# Patient Record
Sex: Male | Born: 1985 | Race: Black or African American | Hispanic: No | Marital: Single | Smoking: Current some day smoker
Health system: Southern US, Community
[De-identification: ages and names within clinical notes are randomized; demographics above are authoritative.]

## PROBLEM LIST (undated history)

## (undated) DIAGNOSIS — I10 Essential (primary) hypertension: Secondary | ICD-10-CM

## (undated) DIAGNOSIS — S62309A Unspecified fracture of unspecified metacarpal bone, initial encounter for closed fracture: Secondary | ICD-10-CM

## (undated) DIAGNOSIS — K219 Gastro-esophageal reflux disease without esophagitis: Secondary | ICD-10-CM

## (undated) HISTORY — DX: Essential (primary) hypertension: I10

## (undated) HISTORY — DX: Unspecified fracture of unspecified metacarpal bone, initial encounter for closed fracture: S62.309A

---
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
## 2007-11-19 ENCOUNTER — Encounter: Payer: Self-pay | Admitting: Family Medicine

## 2007-11-19 ENCOUNTER — Ambulatory Visit: Payer: Self-pay | Admitting: Family Medicine

## 2007-11-19 DIAGNOSIS — L708 Other acne: Secondary | ICD-10-CM | POA: Insufficient documentation

## 2007-11-19 DIAGNOSIS — F17200 Nicotine dependence, unspecified, uncomplicated: Secondary | ICD-10-CM | POA: Insufficient documentation

## 2007-12-19 ENCOUNTER — Encounter (INDEPENDENT_AMBULATORY_CARE_PROVIDER_SITE_OTHER): Payer: Self-pay | Admitting: *Deleted

## 2008-01-03 ENCOUNTER — Encounter: Payer: Self-pay | Admitting: Family Medicine

## 2008-01-03 ENCOUNTER — Ambulatory Visit: Payer: Self-pay | Admitting: Family Medicine

## 2008-01-03 LAB — CONVERTED CEMR LAB
Chlamydia, Swab/Urine, PCR: NEGATIVE
GC Probe Amp, Urine: NEGATIVE

## 2008-01-08 ENCOUNTER — Encounter: Payer: Self-pay | Admitting: Family Medicine

## 2009-06-09 ENCOUNTER — Ambulatory Visit: Payer: Self-pay | Admitting: Family Medicine

## 2009-06-09 ENCOUNTER — Encounter (INDEPENDENT_AMBULATORY_CARE_PROVIDER_SITE_OTHER): Payer: Self-pay | Admitting: *Deleted

## 2009-06-09 ENCOUNTER — Encounter: Payer: Self-pay | Admitting: Family Medicine

## 2009-06-09 LAB — CONVERTED CEMR LAB

## 2009-06-10 ENCOUNTER — Encounter: Payer: Self-pay | Admitting: Family Medicine

## 2010-08-04 DIAGNOSIS — S62309A Unspecified fracture of unspecified metacarpal bone, initial encounter for closed fracture: Secondary | ICD-10-CM

## 2010-08-04 HISTORY — DX: Unspecified fracture of unspecified metacarpal bone, initial encounter for closed fracture: S62.309A

## 2010-08-26 ENCOUNTER — Emergency Department (HOSPITAL_COMMUNITY)
Admission: EM | Admit: 2010-08-26 | Discharge: 2010-08-26 | Disposition: A | Discharge status: Home / Self Care | Payer: Self-pay | Attending: Emergency Medicine | Admitting: Emergency Medicine

## 2010-08-26 ENCOUNTER — Emergency Department (HOSPITAL_COMMUNITY): Payer: Self-pay

## 2010-08-26 DIAGNOSIS — M25539 Pain in unspecified wrist: Secondary | ICD-10-CM | POA: Insufficient documentation

## 2010-08-26 DIAGNOSIS — S62309A Unspecified fracture of unspecified metacarpal bone, initial encounter for closed fracture: Secondary | ICD-10-CM | POA: Insufficient documentation

## 2010-08-26 DIAGNOSIS — S20229A Contusion of unspecified back wall of thorax, initial encounter: Secondary | ICD-10-CM | POA: Insufficient documentation

## 2010-08-26 DIAGNOSIS — M7989 Other specified soft tissue disorders: Secondary | ICD-10-CM | POA: Insufficient documentation

## 2010-08-26 DIAGNOSIS — M545 Low back pain, unspecified: Secondary | ICD-10-CM | POA: Insufficient documentation

## 2010-08-26 DIAGNOSIS — M79609 Pain in unspecified limb: Secondary | ICD-10-CM | POA: Insufficient documentation

## 2010-08-26 DIAGNOSIS — Y92009 Unspecified place in unspecified non-institutional (private) residence as the place of occurrence of the external cause: Secondary | ICD-10-CM | POA: Insufficient documentation

## 2010-08-26 DIAGNOSIS — W108XXA Fall (on) (from) other stairs and steps, initial encounter: Secondary | ICD-10-CM | POA: Insufficient documentation

## 2010-09-06 ENCOUNTER — Encounter: Payer: Self-pay | Admitting: Family Medicine

## 2011-02-14 ENCOUNTER — Inpatient Hospital Stay (INDEPENDENT_AMBULATORY_CARE_PROVIDER_SITE_OTHER)
Admission: RE | Admit: 2011-02-14 | Discharge: 2011-02-14 | Disposition: A | Discharge status: Home / Self Care | Payer: Self-pay | Source: Ambulatory Visit | Attending: Family Medicine | Admitting: Family Medicine

## 2011-02-14 DIAGNOSIS — IMO0002 Reserved for concepts with insufficient information to code with codable children: Secondary | ICD-10-CM

## 2011-02-21 ENCOUNTER — Emergency Department (INDEPENDENT_AMBULATORY_CARE_PROVIDER_SITE_OTHER): Payer: Medicaid Other

## 2011-02-21 ENCOUNTER — Emergency Department (HOSPITAL_BASED_OUTPATIENT_CLINIC_OR_DEPARTMENT_OTHER)
Admission: EM | Admit: 2011-02-21 | Discharge: 2011-02-21 | Disposition: A | Discharge status: Home / Self Care | Payer: Medicaid Other | Attending: Emergency Medicine | Admitting: Emergency Medicine

## 2011-02-21 ENCOUNTER — Encounter (HOSPITAL_BASED_OUTPATIENT_CLINIC_OR_DEPARTMENT_OTHER): Payer: Self-pay | Admitting: *Deleted

## 2011-02-21 DIAGNOSIS — K219 Gastro-esophageal reflux disease without esophagitis: Secondary | ICD-10-CM

## 2011-02-21 DIAGNOSIS — R1013 Epigastric pain: Secondary | ICD-10-CM

## 2011-02-21 DIAGNOSIS — R109 Unspecified abdominal pain: Secondary | ICD-10-CM

## 2011-02-21 LAB — COMPREHENSIVE METABOLIC PANEL
ALT: 20 U/L (ref 0–53)
AST: 20 U/L (ref 0–37)
Alkaline Phosphatase: 70 U/L (ref 39–117)
CO2: 27 mEq/L (ref 19–32)
Calcium: 9.9 mg/dL (ref 8.4–10.5)
GFR calc Af Amer: >60 mL/min (ref >60)
GFR calc non Af Amer: >60 mL/min (ref >60)
Glucose, Bld: 96 mg/dL (ref 70–99)
Potassium: 3.6 mEq/L (ref 3.5–5.1)
Sodium: 137 mEq/L (ref 135–145)

## 2011-02-21 LAB — DIFFERENTIAL
Basophils Absolute: 0.1 10*3/uL (ref 0.0–0.1)
Eosinophils Relative: 8 % — ABNORMAL HIGH (ref 0–5)
Lymphocytes Relative: 47 % — ABNORMAL HIGH (ref 12–46)
Lymphs Abs: 3 10*3/uL (ref 0.7–4.0)
Neutro Abs: 2.2 10*3/uL (ref 1.7–7.7)
Neutrophils Relative %: 35 % — ABNORMAL LOW (ref 43–77)

## 2011-02-21 LAB — CBC
MCV: 86.5 fL (ref 78.0–100.0)
Platelets: 366 10*3/uL (ref 150–400)
RBC: 4.88 MIL/uL (ref 4.22–5.81)
RDW: 11.8 % (ref 11.5–15.5)
WBC: 6.3 10*3/uL (ref 4.0–10.5)

## 2011-02-21 LAB — URINALYSIS, ROUTINE W REFLEX MICROSCOPIC
Bilirubin Urine: NEGATIVE
Glucose, UA: NEGATIVE mg/dL
Hgb urine dipstick: NEGATIVE
Protein, ur: NEGATIVE mg/dL
Specific Gravity, Urine: 1.029 (ref 1.005–1.030)

## 2011-02-21 MED ORDER — GI COCKTAIL ~~LOC~~
30.0000 mL | Freq: Once | ORAL | Status: AC
  Administered 2011-02-21: 30 mL via ORAL
  Filled 2011-02-21: qty 30

## 2011-02-21 MED ORDER — HYDROCODONE-ACETAMINOPHEN 5-500 MG PO TABS: 1.0000 | ORAL_TABLET | Freq: Four times a day (QID) | ORAL | Status: AC | PRN

## 2011-02-21 NOTE — ED Notes (Signed)
Pt c/o generalized abd pain for past few weeks, denies any fever, no N/V. Pt states that he has only had one BM in past few days and that it was not a lot of stool.

## 2011-02-21 NOTE — ED Notes (Signed)
PT generalized abd pain x 3 weeks. Denies n/v/d

## 2011-02-21 NOTE — ED Provider Notes (Signed)
History    Scribed for No att. providers found, the patient was seen in room MH08/MH08. This chart was scribed by Katha Cabal. This patient's care was started at 9:04PM.   CSN: [id number] Arrival date & time: 02/21/2011  8:24 PM  Chief Complaint  Patient presents with  . Abdominal Pain   HPI IMRI LOR is a 25 y.o. male who presents to the Emergency Department complaining of intermittent moderate periumbilical abdominal pain onset 3 weeks ago with associated sleep disturbance due to pain, and decreased bowel movements.  Denies n/v/d,acid reflux, heartburn, change in diet, previous surgeries, dysuria, and daily ETOH use.  Pain is aggravated at bedtime and by drinking ETOH.  HPI ELEMENTS:  Location: periumbilical abdominal   Onset: 3 weeks ago Duration: persistent since onset  Timing:  intermittent   Severity: moderate  Modifying factors: Aggravating worse at bedtime, ETOH,  Context: as above  Associated symptoms: sleep disturbance due to pain, and decreased bowel movements. Denies n/v/d,acid reflux, heartburn, change in diet, previous surgeries, dysuria,   PAST MEDICAL HISTORY:  Past Medical History  Diagnosis Date  . Fracture of metacarpal of right hand, closed 08/2010    Comminuted fx @ [facility]  [facility]    PAST SURGICAL HISTORY:  History reviewed. No pertinent past surgical history.  MEDICATIONS:  Previous Medications   No medications on file     ALLERGIES:  Allergies as of 02/21/2011  . (No Known Allergies)     FAMILY HISTORY:  History reviewed. No pertinent family history.   SOCIAL HISTORY: History   Social History  . Marital Status: Single    Spouse Name: N/A    Number of Children: N/A  . Years of Education: N/A   Social History Main Topics  . Smoking status: Current Everyday Smoker -- 1.0 packs/day  . Smokeless tobacco: None  . Alcohol Use: No  . Drug Use: Yes  . Sexually Active: No   Other Topics Concern  . None   Social History  Narrative  . None     Review of Systems 10 Systems reviewed and are negative for acute change except as noted in the HPI.  Physical Exam  BP 117/69  Pulse 60  Temp(Src) 98.9 F (37.2 C) (Oral)  Resp 16  Wt 149 lb (67.586 kg)  SpO2 100%  Physical Exam  Nursing note and vitals reviewed. Constitutional: He is oriented to person, place, and time. He appears well-developed and well-nourished.  HENT:  Head: Normocephalic and atraumatic.  Mouth/Throat: Oropharynx is clear and moist.  Neck: Normal range of motion. Neck supple.  Cardiovascular: Normal rate, regular rhythm and normal heart sounds.  Exam reveals no gallop.   No murmur heard. Pulmonary/Chest: Effort normal and breath sounds normal. No respiratory distress. He has no wheezes. He has no rales.  Abdominal: Soft. Bowel sounds are normal. He exhibits no distension. There is tenderness (epigastric tenderness). There is no CVA tenderness.  Musculoskeletal: Normal range of motion.  Lymphadenopathy:    He has no cervical adenopathy.  Neurological: He is alert and oriented to person, place, and time.  Skin: Skin is warm and dry.  Psychiatric: He has a normal mood and affect. His behavior is normal.    ED Course  Procedures  OTHER DATA REVIEWED: Nursing notes, vital signs, and past medical records reviewed.  LABS / RADIOLOGY:  Results for orders placed during the hospital encounter of 02/21/11  CBC      Component Value Range   WBC  6.3  4.0 - 10.5 (K/uL)   RBC 4.88  4.22 - 5.81 (MIL/uL)   Hemoglobin 14.8  13.0 - 17.0 (g/dL)   HCT 78.2  95.6 - 21.3 (%)   MCV 86.5  78.0 - 100.0 (fL)   MCH 30.3  26.0 - 34.0 (pg)   MCHC 35.1  30.0 - 36.0 (g/dL)   RDW 08.6  57.8 - 46.9 (%)   Platelets 366  150 - 400 (K/uL)  DIFFERENTIAL      Component Value Range   Neutrophils Relative 35 (*) 43 - 77 (%)   Neutro Abs 2.2  1.7 - 7.7 (K/uL)   Lymphocytes Relative 47 (*) 12 - 46 (%)   Lymphs Abs 3.0  0.7 - 4.0 (K/uL)   Monocytes Relative  10  3 - 12 (%)   Monocytes Absolute 0.6  0.1 - 1.0 (K/uL)   Eosinophils Relative 8 (*) 0 - 5 (%)   Eosinophils Absolute 0.5  0.0 - 0.7 (K/uL)   Basophils Relative 1  0 - 1 (%)   Basophils Absolute 0.1  0.0 - 0.1 (K/uL)  COMPREHENSIVE METABOLIC PANEL      Component Value Range   Sodium 137  135 - 145 (mEq/L)   Potassium 3.6  3.5 - 5.1 (mEq/L)   Chloride 101  96 - 112 (mEq/L)   CO2 27  19 - 32 (mEq/L)   Glucose, Bld 96  70 - 99 (mg/dL)   BUN 14  6 - 23 (mg/dL)   Creatinine, Ser 6.29  0.50 - 1.35 (mg/dL)   Calcium 9.9  8.4 - 52.8 (mg/dL)   Total Protein 7.5  6.0 - 8.3 (g/dL)   Albumin 4.3  3.5 - 5.2 (g/dL)   AST 20  0 - 37 (U/L)   ALT 20  0 - 53 (U/L)   Alkaline Phosphatase 70  39 - 117 (U/L)   Total Bilirubin 1.0  0.3 - 1.2 (mg/dL)   GFR calc non Af Amer >60  >60 (mL/min)   GFR calc Af Amer >60  >60 (mL/min)  LIPASE, BLOOD      Component Value Range   Lipase 31  11 - 59 (U/L)  URINALYSIS, ROUTINE W REFLEX MICROSCOPIC      Component Value Range   Color, Urine YELLOW  YELLOW    Appearance CLOUDY (*) CLEAR    Specific Gravity, Urine 1.029  1.005 - 1.030    pH 7.0  5.0 - 8.0    Glucose, UA NEGATIVE  NEGATIVE (mg/dL)   Hgb urine dipstick NEGATIVE  NEGATIVE    Bilirubin Urine NEGATIVE  NEGATIVE    Ketones, ur NEGATIVE  NEGATIVE (mg/dL)   Protein, ur NEGATIVE  NEGATIVE (mg/dL)   Urobilinogen, UA 2.0 (*) 0.0 - 1.0 (mg/dL)   Nitrite NEGATIVE  NEGATIVE    Leukocytes, UA NEGATIVE  NEGATIVE      Dg Abd 1 View  02/21/2011  *RADIOLOGY REPORT*  Clinical Data: Abdominal pain, evaluate for constipation  ABDOMEN - 1 VIEW  Comparison: None.  Findings: Nonobstructive bowel gas pattern.  Unremarkable colonic stool burden.  Evaluate for pneumoperitoneum is limited secondary to supine patient positioning and exclusion of the lower thorax. No acute osseous abnormalities.  IMPRESSION: Nonobstructive bowel gas pattern.  Unremarkable colonic stool burden.  Original Report Authenticated By: Waynard Reeds, M.D.      ED COURSE / COORDINATION OF CARE:  Orders Placed This Encounter  Procedures  . DG Abd 1 View  . CBC  .  Differential  . Comprehensive metabolic panel  . Lipase, blood  . Urinalysis with microscopic    MDM: Improvement with gi cocktail, labs okay.  Likely gerd.  Will treat with prilosec, limited pain meds.    IMPRESSION: Diagnoses that have been ruled out:  Diagnoses that are still under consideration:  Final diagnoses:  Abdominal pain, epigastric  GERD (gastroesophageal reflux disease)    PLAN:  Home Advised to return for worsening or additional problems such as abdominal or chest pain The patient is to return the emergency department if there is any worsening of symptoms. I have reviewed the discharge instructions with the patient.     CONDITION ON DISCHARGE: Good   MEDICATIONS GIVEN IN THE E.D.  Medications  HYDROcodone-acetaminophen (VICODIN) 5-500 MG per tablet (not administered)  gi cocktail (30 mL Oral Given 02/21/11 2124)     DISCHARGE MEDICATIONS: New Prescriptions   HYDROCODONE-ACETAMINOPHEN (VICODIN) 5-500 MG PER TABLET    Take 1-2 tablets by mouth every 6 (six) hours as needed for pain.   I personally performed the services described in this documentation, which was scribed in my presence. The recorded information has been reviewed and considered. No att. providers found    Geoffery Lyons, MD 02/22/11 [phone]

## 2011-02-21 NOTE — ED Notes (Signed)
Pt ambulated to radiology in NAD

## 2016-01-16 ENCOUNTER — Emergency Department (HOSPITAL_BASED_OUTPATIENT_CLINIC_OR_DEPARTMENT_OTHER)
Admission: EM | Admit: 2016-01-16 | Discharge: 2016-01-16 | Disposition: A | Discharge status: Home / Self Care | Payer: Medicaid Other | Attending: Emergency Medicine | Admitting: Emergency Medicine

## 2016-01-16 ENCOUNTER — Encounter (HOSPITAL_BASED_OUTPATIENT_CLINIC_OR_DEPARTMENT_OTHER): Payer: Self-pay | Admitting: Emergency Medicine

## 2016-01-16 ENCOUNTER — Emergency Department (HOSPITAL_BASED_OUTPATIENT_CLINIC_OR_DEPARTMENT_OTHER): Payer: Medicaid Other

## 2016-01-16 DIAGNOSIS — Y999 Unspecified external cause status: Secondary | ICD-10-CM | POA: Insufficient documentation

## 2016-01-16 DIAGNOSIS — F17200 Nicotine dependence, unspecified, uncomplicated: Secondary | ICD-10-CM | POA: Insufficient documentation

## 2016-01-16 DIAGNOSIS — W0110XA Fall on same level from slipping, tripping and stumbling with subsequent striking against unspecified object, initial encounter: Secondary | ICD-10-CM | POA: Insufficient documentation

## 2016-01-16 DIAGNOSIS — Y929 Unspecified place or not applicable: Secondary | ICD-10-CM | POA: Insufficient documentation

## 2016-01-16 DIAGNOSIS — S51012A Laceration without foreign body of left elbow, initial encounter: Secondary | ICD-10-CM | POA: Insufficient documentation

## 2016-01-16 DIAGNOSIS — Y939 Activity, unspecified: Secondary | ICD-10-CM | POA: Insufficient documentation

## 2016-01-16 MED ORDER — LIDOCAINE HCL (PF) 1 % IJ SOLN
5.0000 mL | Freq: Once | INTRAMUSCULAR | Status: AC
  Administered 2016-01-16: 5 mL
  Filled 2016-01-16: qty 5

## 2016-01-16 MED ORDER — SODIUM BICARBONATE 4 % IV SOLN
5.0000 mL | Freq: Once | INTRAVENOUS | Status: AC
  Administered 2016-01-16: 5 mL via SUBCUTANEOUS
  Filled 2016-01-16: qty 5

## 2016-01-16 NOTE — Discharge Instructions (Signed)
Laceration Care, Adult °A laceration is a cut that goes through all of the layers of the skin and into the tissue that is right under the skin. Some lacerations heal on their own. Others need to be closed with stitches (sutures), staples, skin adhesive strips, or skin glue. Proper laceration care minimizes the risk of infection and helps the laceration to heal better. °HOW TO CARE FOR YOUR LACERATION °If sutures or staples were used: °· Keep the wound clean and dry. °· If you were given a bandage (dressing), you should change it at least one time per day or as told by your health care provider. You should also change it if it becomes wet or dirty. °· Keep the wound completely dry for the first 24 hours or as told by your health care provider. After that time, you may shower or bathe. However, make sure that the wound is not soaked in water until after the sutures or staples have been removed. °· Clean the wound one time each day or as told by your health care provider: °· Wash the wound with soap and water. °· Rinse the wound with water to remove all soap. °· Pat the wound dry with a clean towel. Do not rub the wound. °· After cleaning the wound, apply a thin layer of antibiotic ointment as told by your health care provider. This will help to prevent infection and keep the dressing from sticking to the wound. °· Have the sutures or staples removed as told by your health care provider. °If skin adhesive strips were used: °· Keep the wound clean and dry. °· If you were given a bandage (dressing), you should change it at least one time per day or as told by your health care provider. You should also change it if it becomes dirty or wet. °· Do not get the skin adhesive strips wet. You may shower or bathe, but be careful to keep the wound dry. °· If the wound gets wet, pat it dry with a clean towel. Do not rub the wound. °· Skin adhesive strips fall off on their own. You may trim the strips as the wound heals. Do not  remove skin adhesive strips that are still stuck to the wound. They will fall off in time. °If skin glue was used: °· Try to keep the wound dry, but you may briefly wet it in the shower or bath. Do not soak the wound in water, such as by swimming. °· After you have showered or bathed, gently pat the wound dry with a clean towel. Do not rub the wound. °· Do not do any activities that will make you sweat heavily until the skin glue has fallen off on its own. °· Do not apply liquid, cream, or ointment medicine to the wound while the skin glue is in place. Using those may loosen the film before the wound has healed. °· If you were given a bandage (dressing), you should change it at least one time per day or as told by your health care provider. You should also change it if it becomes dirty or wet. °· If a dressing is placed over the wound, be careful not to apply tape directly over the skin glue. Doing that may cause the glue to be pulled off before the wound has healed. °· Do not pick at the glue. The skin glue usually remains in place for 5-10 days, then it falls off of the skin. °General Instructions °· Take over-the-counter and prescription   medicines only as told by your health care provider. °· If you were prescribed an antibiotic medicine or ointment, take or apply it as told by your doctor. Do not stop using it even if your condition improves. °· To help prevent scarring, make sure to cover your wound with sunscreen whenever you are outside after stitches are removed, after adhesive strips are removed, or when glue remains in place and the wound is healed. Make sure to wear a sunscreen of at least 30 SPF. °· Do not scratch or pick at the wound. °· Keep all follow-up visits as told by your health care provider. This is important. °· Check your wound every day for signs of infection. Watch for: °· Redness, swelling, or pain. °· Fluid, blood, or pus. °· Raise (elevate) the injured area above the level of your heart  while you are sitting or lying down, if possible. °SEEK MEDICAL CARE IF: °· You received a tetanus shot and you have swelling, severe pain, redness, or bleeding at the injection site. °· You have a fever. °· A wound that was closed breaks open. °· You notice a bad smell coming from your wound or your dressing. °· You notice something coming out of the wound, such as wood or glass. °· Your pain is not controlled with medicine. °· You have increased redness, swelling, or pain at the site of your wound. °· You have fluid, blood, or pus coming from your wound. °· You notice a change in the color of your skin near your wound. °· You need to change the dressing frequently due to fluid, blood, or pus draining from the wound. °· You develop a new rash. °· You develop numbness around the wound. °SEEK IMMEDIATE MEDICAL CARE IF: °· You develop severe swelling around the wound. °· Your pain suddenly increases and is severe. °· You develop painful lumps near the wound or on skin that is anywhere on your body. °· You have a red streak going away from your wound. °· The wound is on your hand or foot and you cannot properly move a finger or toe. °· The wound is on your hand or foot and you notice that your fingers or toes look pale or bluish. °  °This information is not intended to replace advice given to you by your health care provider. Make sure you discuss any questions you have with your health care provider. °  °Document Released: 06/20/2005 Document Revised: 11/04/2014 Document Reviewed: 06/16/2014 °Elsevier Interactive Patient Education ©2016 [employer] ° °Stitches, Staples, or Adhesive Wound Closure °Health care providers use stitches (sutures), staples, and certain glue (skin adhesives) to hold skin together while it heals (wound closure). You may need this treatment after you have surgery or if you cut your skin accidentally. These methods help your skin to heal more quickly and make it less likely that you will have  a scar. A wound may take several months to heal completely. °The type of wound you have determines when your wound gets closed. In most cases, the wound is closed as soon as possible (primary skin closure). Sometimes, closure is delayed so the wound can be cleaned and allowed to heal naturally. This reduces the chance of infection. Delayed closure may be needed if your wound: °· Is caused by a bite. °· Happened more than 6 hours ago. °· Involves loss of skin or the tissues under the skin. °· Has dirt or debris in it that cannot be removed. °· Is infected. °WHAT   ARE THE DIFFERENT KINDS OF WOUND CLOSURES? °There are many options for wound closure. The one that your health care provider uses depends on how deep and how large your wound is. °Adhesive Glue °To use this type of glue to close a wound, your health care provider holds the edges of the wound together and paints the glue on the surface of your skin. You may need more than one layer of glue. Then the wound may be covered with a light bandage (dressing). °This type of skin closure may be used for small wounds that are not deep (superficial). Using glue for wound closure is less painful than other methods. It does not require a medicine that numbs the area (local anesthetic). This method also leaves nothing to be removed. Adhesive glue is often used for children and on facial wounds. °Adhesive glue cannot be used for wounds that are deep, uneven, or bleeding. It is not used inside of a wound.  °Adhesive Strips °These strips are made of sticky (adhesive), porous paper. They are applied across your skin edges like a regular adhesive bandage. You leave them on until they fall off. °Adhesive strips may be used to close very superficial wounds. They may also be used along with sutures to improve the closure of your skin edges.  °Sutures °Sutures are the oldest method of wound closure. Sutures can be made from natural substances, such as silk, or from synthetic  materials, such as nylon and steel. They can be made from a material that your body can break down as your wound heals (absorbable), or they can be made from a material that needs to be removed from your skin (nonabsorbable). They come in many different strengths and sizes. °Your health care provider attaches the sutures to a steel needle on one end. Sutures can be passed through your skin, or through the tissues beneath your skin. Then they are tied and cut. Your skin edges may be closed in one continuous stitch or in separate stitches. °Sutures are strong and can be used for all kinds of wounds. Absorbable sutures may be used to close tissues under the skin. The disadvantage of sutures is that they may cause skin reactions that lead to infection. Nonabsorbable sutures need to be removed. °Staples °When surgical staples are used to close a wound, the edges of your skin on both sides of the wound are brought close together. A staple is placed across the wound, and an instrument secures the edges together. Staples are often used to close surgical cuts (incisions). °Staples are faster to use than sutures, and they cause less skin reaction. Staples need to be removed using a tool that bends the staples away from your skin. °HOW DO I CARE FOR MY WOUND CLOSURE? °· Take medicines only as directed by your health care provider. °· If you were prescribed an antibiotic medicine for your wound, finish it all even if you start to feel better. °· Use ointments or creams only as directed by your health care provider. °· Wash your hands with soap and water before and after touching your wound. °· Do not soak your wound in water. Do not take baths, swim, or use a hot tub until your health care provider approves. °· Ask your health care provider when you can start showering. Cover your wound if directed by your health care provider. °· Do not take out your own sutures or staples. °· Do not pick at your wound. Picking can cause an  infection. °·   Keep all follow-up visits as directed by your health care provider. This is important. °HOW LONG WILL I HAVE MY WOUND CLOSURE? °· Leave adhesive glue on your skin until the glue peels away. °· Leave adhesive strips on your skin until the strips fall off. °· Absorbable sutures will dissolve within several days. °· Nonabsorbable sutures and staples must be removed. The location of the wound will determine how long they stay in. This can range from several days to a couple of weeks. °WHEN SHOULD I SEEK HELP FOR MY WOUND CLOSURE? °Contact your health care provider if: °· You have a fever. °· You have chills. °· You have drainage, redness, swelling, or pain at your wound. °· There is a bad smell coming from your wound. °· The skin edges of your wound start to separate after your sutures have been removed. °· Your wound becomes thick, raised, and darker in color after your sutures come out (scarring). °  °This information is not intended to replace advice given to you by your health care provider. Make sure you discuss any questions you have with your health care provider. °  °Document Released: 03/15/2001 Document Revised: 07/11/2014 Document Reviewed: 11/27/2013 °Elsevier Interactive Patient Education ©2016 [employer] ° °

## 2016-01-16 NOTE — ED Provider Notes (Signed)
CSN: [id number]     Arrival date & time 01/16/16  1240 History   First MD Initiated Contact with Patient 01/16/16 1247     Chief Complaint  Patient presents with  . Extremity Laceration      HPI Patient fell and hit his left elbow. Left elbow pain with laceration. No other injury. Did not hit his head. No neck pain. No numbness or weakness. He is not on anticoagulation.   Past Medical History  Diagnosis Date  . Fracture of metacarpal of right hand, closed 08/2010    Comminuted fx @ [facility]  of [facility]   History reviewed. No pertinent past surgical history. No family history on file. Social History  Substance Use Topics  . Smoking status: Current Every Day Smoker -- 1.00 packs/day  . Smokeless tobacco: None  . Alcohol Use: Yes    Review of Systems  Constitutional: Negative for fever.  Cardiovascular: Negative for chest pain.  Gastrointestinal: Negative for abdominal pain.  Musculoskeletal: Negative for back pain and neck pain.       Left elbow pain.  Skin: Positive for wound.  Neurological: Negative for weakness, numbness and headaches.      Allergies  Review of patient's allergies indicates no known allergies.  Home Medications   Prior to Admission medications   Not on File   BP 130/80 mmHg  Pulse 78  Temp(Src) 98.2 F (36.8 C) (Oral)  Resp 16  Ht 5\' 9"  (1.753 m)  Wt 190 lb (86.183 kg)  BMI 28.05 kg/m2  SpO2 100% Physical Exam  Constitutional: He appears well-developed.  Musculoskeletal:  1.5 cm laceration over olecranon of left elbow. Some underlying tenderness. No foreign body seen. Neurovascular intact in left hand. Range of motion intact left elbow and left wrist. No shoulder tenderness.  Skin: Skin is warm.    ED Course  .Marland Kitchen.Laceration Repair Date/Time: 01/16/2016 2:01 PM Performed by: Benjiman CorePICKERING, Kade Rickels Authorized by: Benjiman CorePICKERING, Avrey Hyser Consent: Verbal consent obtained. Risks and benefits: risks, benefits and alternatives were discussed Consent  given by: patient Body area: upper extremity Location details: left elbow Laceration length: 1.5 cm Tendon involvement: none Nerve involvement: none Vascular damage: no Anesthesia: local infiltration Local anesthetic: lidocaine 1% without epinephrine (with neut) Anesthetic total: 2 ml Patient sedated: no Preparation: Patient was prepped and draped in the usual sterile fashion. Irrigation solution: Peroxide scrub. Amount of cleaning: standard Debridement: none Degree of undermining: none Wound skin closure material used: 4-0 vicryl rapide. Number of sutures: 3 Technique: simple Approximation: close Approximation difficulty: simple Dressing: 4x4 sterile gauze Patient tolerance: Patient tolerated the procedure well with no immediate complications   (including critical care time) Labs Review Labs Reviewed - No data to display  Imaging Review Dg Elbow Complete Left  01/16/2016  CLINICAL DATA:  Acute left elbow injury with pain and bleeding. Olecranon region laceration. EXAM: LEFT ELBOW - COMPLETE 3+ VIEW COMPARISON:  None available FINDINGS: There is no evidence of fracture, dislocation, or joint effusion. There is no evidence of arthropathy or other focal bone abnormality. Soft tissues are unremarkable. No radiopaque foreign body. IMPRESSION: Negative. Electronically Signed   By: Judie PetitM.  Shick M.D.   On: 01/16/2016 13:53   I have personally reviewed and evaluated these images and lab results as part of my medical decision-making.   EKG Interpretation None      MDM   Final diagnoses:  Elbow laceration, left, initial encounter    Patient with fall and elbow laceration. Negative x-ray for foreign body  or fracture. Wound closed. She was out in 10 days. Discharge home.    Benjiman Core, MD 01/16/16 [phone]

## 2016-01-16 NOTE — ED Notes (Signed)
Pt presents with small laceration to L upper arm, bleeding controlled. Pt states he tripped and fell 2 hours ago.

## 2016-01-22 ENCOUNTER — Encounter (HOSPITAL_COMMUNITY): Payer: Self-pay | Admitting: Emergency Medicine

## 2016-01-22 ENCOUNTER — Ambulatory Visit (HOSPITAL_COMMUNITY)
Admission: EM | Admit: 2016-01-22 | Discharge: 2016-01-22 | Disposition: A | Discharge status: Home / Self Care | Payer: Medicaid Other | Attending: Family Medicine | Admitting: Family Medicine

## 2016-01-22 DIAGNOSIS — A084 Viral intestinal infection, unspecified: Secondary | ICD-10-CM

## 2016-01-22 MED ORDER — ONDANSETRON 8 MG PO TBDP: 8.0000 mg | ORAL_TABLET | Freq: Three times a day (TID) | ORAL | Status: DC | PRN

## 2016-01-22 NOTE — Discharge Instructions (Signed)

## 2016-01-22 NOTE — ED Provider Notes (Signed)
CSN: [id number]     Arrival date & time 01/22/16  1034 History   First MD Initiated Contact with Patient 01/22/16 1114     Chief Complaint  Patient presents with  . Abdominal Pain   (Consider location/radiation/quality/duration/timing/severity/associated sxs/prior Treatment) HPI Comments: Patient presents with c/o abdominal pain for 3 days.  Patient states he feels like this is due to drinking too much alcohol a few days ago. He has not had any alcohol in 3 days.  He has cramping and nausea. He has been vomiting and not eating or drinking.  He c/o bowel spasms, nausea, and diarrhea.    Patient is a 30 y.o. male presenting with abdominal pain. The history is provided by the patient.  Abdominal Pain Pain location:  Generalized Pain quality: cramping   Pain radiates to:  Does not radiate Pain severity:  Mild Onset quality:  Sudden Duration:  3 days Timing:  Intermittent Chronicity:  New Context: alcohol use and retching   Relieved by:  None tried Worsened by:  Nothing tried Ineffective treatments:  None tried Associated symptoms: anorexia   Risk factors: alcohol abuse     Past Medical History  Diagnosis Date  . Fracture of metacarpal of right hand, closed 08/2010    Comminuted fx @ base  of R 4[facility]   History reviewed. No pertinent past surgical history. History reviewed. No pertinent family history. Social History  Substance Use Topics  . Smoking status: Current Every Day Smoker -- 1.00 packs/day  . Smokeless tobacco: None  . Alcohol Use: Yes    Review of Systems  Constitutional: Negative.   HENT: Negative.   Eyes: Negative.   Gastrointestinal: Positive for abdominal pain and anorexia.    Allergies  Review of patient's allergies indicates no known allergies.  Home Medications   Prior to Admission medications   Not on File   Meds Ordered and Administered this Visit  Medications - No data to display  BP 165/112 mmHg  Pulse 84  Temp(Src) 98.4 F (36.9 C)  (Oral)  Resp 16  SpO2 98% No data found.   Physical Exam  Constitutional: He appears well-developed and well-nourished.  HENT:  Head: Normocephalic.  Right Ear: External ear normal.  Left Ear: External ear normal.  Mouth/Throat: Oropharynx is clear and moist.  Eyes: Conjunctivae are normal. Pupils are equal, round, and reactive to light.  Neck: Normal range of motion. Neck supple.  Cardiovascular: Normal rate, regular rhythm and normal heart sounds.   Pulmonary/Chest: Effort normal and breath sounds normal.  Abdominal: Soft. Bowel sounds are normal.  Musculoskeletal: Normal range of motion.    ED Course  Procedures (including critical care time)  Labs Review Labs Reviewed - No data to display  Imaging Review No results found.   Visual Acuity Review  Right Eye Distance:   Left Eye Distance:   Bilateral Distance:    Right Eye Near:   Left Eye Near:    Bilateral Near:         MDM  Viral Gastroenteritis - Zofran 8mg  po tid prn nausea #20.   Advised him to push po fluids and start eating. Advised him to avoid ETOH and advised him if not feeling better in next few days then follow up. Explained probable viral AGE and will clear up in next few days.     Deatra CanterWilliam J Oxford, FNP 01/22/16 1148

## 2016-01-22 NOTE — ED Notes (Signed)
[facility] with a complaint of abdominal pain x 3 days.

## 2016-10-02 ENCOUNTER — Emergency Department (HOSPITAL_BASED_OUTPATIENT_CLINIC_OR_DEPARTMENT_OTHER)
Admission: EM | Admit: 2016-10-02 | Discharge: 2016-10-02 | Disposition: A | Discharge status: Home / Self Care | Payer: Self-pay | Attending: Emergency Medicine | Admitting: Emergency Medicine

## 2016-10-02 ENCOUNTER — Emergency Department (HOSPITAL_BASED_OUTPATIENT_CLINIC_OR_DEPARTMENT_OTHER): Payer: Self-pay

## 2016-10-02 ENCOUNTER — Encounter (HOSPITAL_BASED_OUTPATIENT_CLINIC_OR_DEPARTMENT_OTHER): Payer: Self-pay | Admitting: Emergency Medicine

## 2016-10-02 DIAGNOSIS — Y9389 Activity, other specified: Secondary | ICD-10-CM | POA: Insufficient documentation

## 2016-10-02 DIAGNOSIS — F17200 Nicotine dependence, unspecified, uncomplicated: Secondary | ICD-10-CM | POA: Insufficient documentation

## 2016-10-02 DIAGNOSIS — W52XXXA Crushed, pushed or stepped on by crowd or human stampede, initial encounter: Secondary | ICD-10-CM | POA: Insufficient documentation

## 2016-10-02 DIAGNOSIS — Y929 Unspecified place or not applicable: Secondary | ICD-10-CM | POA: Insufficient documentation

## 2016-10-02 DIAGNOSIS — Y999 Unspecified external cause status: Secondary | ICD-10-CM | POA: Insufficient documentation

## 2016-10-02 DIAGNOSIS — S60221A Contusion of right hand, initial encounter: Secondary | ICD-10-CM | POA: Insufficient documentation

## 2016-10-02 MED ORDER — IBUPROFEN 800 MG PO TABS: 800.0000 mg | ORAL_TABLET | Freq: Three times a day (TID) | ORAL | 0 refills | Status: DC

## 2016-10-02 NOTE — Discharge Instructions (Signed)
If you notice red streak extending up your forearm please return for further care.

## 2016-10-02 NOTE — ED Notes (Signed)
Given ice pack for right hand
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
## 2017-10-28 ENCOUNTER — Emergency Department (HOSPITAL_BASED_OUTPATIENT_CLINIC_OR_DEPARTMENT_OTHER)
Admission: EM | Admit: 2017-10-28 | Discharge: 2017-10-28 | Disposition: A | Discharge status: Home / Self Care | Payer: Self-pay | Attending: Emergency Medicine | Admitting: Emergency Medicine

## 2017-10-28 ENCOUNTER — Other Ambulatory Visit: Payer: Self-pay

## 2017-10-28 ENCOUNTER — Encounter (HOSPITAL_BASED_OUTPATIENT_CLINIC_OR_DEPARTMENT_OTHER): Payer: Self-pay | Admitting: *Deleted

## 2017-10-28 DIAGNOSIS — K0889 Other specified disorders of teeth and supporting structures: Secondary | ICD-10-CM | POA: Insufficient documentation

## 2017-10-28 DIAGNOSIS — F17200 Nicotine dependence, unspecified, uncomplicated: Secondary | ICD-10-CM | POA: Insufficient documentation

## 2017-10-28 MED ORDER — MELOXICAM 15 MG PO TABS: 15.0000 mg | ORAL_TABLET | Freq: Every day | ORAL | 0 refills | Status: DC

## 2017-10-28 MED ORDER — PENICILLIN V POTASSIUM 500 MG PO TABS: 500.0000 mg | ORAL_TABLET | Freq: Four times a day (QID) | ORAL | 0 refills | Status: AC

## 2017-10-28 NOTE — ED Triage Notes (Signed)
Pt c/o dental pain x 3 days.

## 2017-10-28 NOTE — ED Provider Notes (Signed)
[facility] EMERGENCY DEPARTMENT Provider Note   CSN: [id number] Arrival date & time: 10/28/17  1038     History   Chief Complaint Chief Complaint  Patient presents with  . Dental Pain    HPI Devin Davis is a 32 y.o. male who presents to ED for evaluation of left lower dental pain for the past 3 days.  He states that he was eating a piece of chicken when the tooth chipped off.  He has been taking BC powders with mild to no improvement in his symptoms.  Has not seen a dentist in several years.  He denies any bleeding or drainage from site, trismus, drooling, trouble breathing, trouble swallowing, neck pain, rashes, fever.  HPI  Past Medical History:  Diagnosis Date  . Fracture of metacarpal of right hand, closed 08/2010   Comminuted fx @ base  of R 4[facility]    Patient Active Problem List   Diagnosis Date Noted  . TOBACCO USE 11/19/2007  . 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 No pertinent surgical history.      Home Medications    Prior to Admission medications   Medication Sig Start Date End Date Taking? Authorizing Provider  ibuprofen (ADVIL,MOTRIN) 800 MG tablet Take 1 tablet (800 mg total) by mouth 3 (three) times daily. 10/02/16   Fayrene Helper, PA-C  meloxicam (MOBIC) 15 MG tablet Take 1 tablet (15 mg total) by mouth daily. 10/28/17   Scout Guyett, PA-C  ondansetron (ZOFRAN ODT) 8 MG disintegrating tablet Take 1 tablet (8 mg total) by mouth every 8 (eight) hours as needed for nausea or vomiting. 01/22/16   Deatra Canter, FNP  penicillin v potassium (VEETID) 500 MG tablet Take 1 tablet (500 mg total) by mouth 4 (four) times daily for 7 days. 10/28/17 11/04/17  Dietrich Pates, PA-C    Family History History reviewed. No pertinent family history.  Social History Social History   Tobacco Use  . Smoking status: Current Every Day Smoker    Packs/day: 1.00  . Smokeless tobacco: Never Used  Substance Use Topics  . Alcohol use: Yes   . Drug use: No     Allergies   Patient has no known allergies.   Review of Systems Review of Systems  Constitutional: Negative for chills and fever.  HENT: Positive for dental problem. Negative for drooling, facial swelling, nosebleeds, postnasal drip, rhinorrhea, sneezing, sore throat and trouble swallowing.   Respiratory: Negative for shortness of breath.   Gastrointestinal: Negative for nausea and vomiting.  Musculoskeletal: Negative for neck pain.     Physical Exam Updated Vital Signs BP (!) 149/99   Pulse 91   Temp 98.9 F (37.2 C)   Resp 16   Ht  (1.753 m)   Wt 85.7 kg (189 lb)   SpO2 100%   BMI 27.91 kg/m   Physical Exam  Constitutional: He appears well-developed and well-nourished. No distress.  Nontoxic-appearing and in no acute distress.  Speaking in complete sentences without difficulty.  HENT:  Head: Normocephalic and atraumatic.  Right Ear: Tympanic membrane normal.  Left Ear: Tympanic membrane normal.  Nose: Nose normal.  Mouth/Throat: Uvula is midline. Abnormal dentition. Dental caries present.    Chipped tooth noted in the area.  No gross dental abscess or site of drainage at this time. No facial, neck or cheek swelling noted. No pooling of secretions or trismus.  Normal voice noted with no difficulty swallowing or breathing. No submandibular, swelling,  erythema or crepitus noted.  Eyes: Conjunctivae and EOM are normal. No scleral icterus.  Neck: Normal range of motion.  Cardiovascular: Normal rate, regular rhythm and normal heart sounds.  Pulmonary/Chest: Effort normal and breath sounds normal. No respiratory distress.  Neurological: He is alert.  Skin: No rash noted. He is not diaphoretic.  Psychiatric: He has a normal mood and affect.  Nursing note and vitals reviewed.    ED Treatments / Results  Labs (all labs ordered are listed, but only abnormal results are displayed) Labs Reviewed - No data to display  EKG None  Radiology No  results found.  Procedures Procedures (including critical care time)  Medications Ordered in ED Medications - No data to display   Initial Impression / Assessment and Plan / ED Course  I have reviewed the triage vital signs and the nursing notes.  Pertinent labs & imaging results that were available during my care of the patient were reviewed by me and considered in my medical decision making (see chart for details).     Patient presents to ED for evaluation of left lower dental pain for the past 3 days.  Has not seen a dentist in several years.  There is a chipped tooth and overall poor dentition noted on physical examination.  No gross dental abscess or site of drainage at this time.  No sign of Ludwig angina or other deep neck infection  He is speaking in a normal voice no trismus or shortness of breath noted.  Will give anti-inflammatories, antibiotics and follow-up with dentist on call today.  Advised to return for any severe worsening symptoms.  Portions of this note were generated with Scientist, clinical (histocompatibility and immunogenetics). Dictation errors may occur despite best attempts at proofreading.   Final Clinical Impressions(s) / ED Diagnoses   Final diagnoses:  Pain, dental    ED Discharge Orders        Ordered    penicillin v potassium (VEETID) 500 MG tablet  4 times daily     10/28/17 1054    meloxicam (MOBIC) 15 MG tablet  Daily     10/28/17 1054       Dietrich Pates, PA-C 10/28/17 1103    Charlynne Pander, MD 10/30/17 1401

## 2017-10-28 NOTE — ED Notes (Signed)
ED Provider at bedside. 

## 2017-12-29 ENCOUNTER — Emergency Department (HOSPITAL_BASED_OUTPATIENT_CLINIC_OR_DEPARTMENT_OTHER)
Admission: EM | Admit: 2017-12-29 | Discharge: 2017-12-29 | Disposition: A | Discharge status: Home / Self Care | Payer: Self-pay | Attending: Emergency Medicine | Admitting: Emergency Medicine

## 2017-12-29 ENCOUNTER — Encounter (HOSPITAL_BASED_OUTPATIENT_CLINIC_OR_DEPARTMENT_OTHER): Payer: Self-pay | Admitting: *Deleted

## 2017-12-29 ENCOUNTER — Other Ambulatory Visit: Payer: Self-pay

## 2017-12-29 DIAGNOSIS — R112 Nausea with vomiting, unspecified: Secondary | ICD-10-CM | POA: Insufficient documentation

## 2017-12-29 DIAGNOSIS — F1290 Cannabis use, unspecified, uncomplicated: Secondary | ICD-10-CM | POA: Insufficient documentation

## 2017-12-29 DIAGNOSIS — R1013 Epigastric pain: Secondary | ICD-10-CM | POA: Insufficient documentation

## 2017-12-29 DIAGNOSIS — F17210 Nicotine dependence, cigarettes, uncomplicated: Secondary | ICD-10-CM | POA: Insufficient documentation

## 2017-12-29 DIAGNOSIS — Z79899 Other long term (current) drug therapy: Secondary | ICD-10-CM | POA: Insufficient documentation

## 2017-12-29 LAB — CBC WITH DIFFERENTIAL/PLATELET
Basophils Absolute: 0 10*3/uL (ref 0.0–0.1)
Basophils Relative: 1 %
EOS ABS: 0.3 10*3/uL (ref 0.0–0.7)
Eosinophils Relative: 4 %
HEMATOCRIT: 46.1 % (ref 39.0–52.0)
HEMOGLOBIN: 16.1 g/dL (ref 13.0–17.0)
LYMPHS ABS: 1.8 10*3/uL (ref 0.7–4.0)
Lymphocytes Relative: 26 %
MCH: 29.4 pg (ref 26.0–34.0)
MCHC: 34.9 g/dL (ref 30.0–36.0)
MCV: 84.1 fL (ref 78.0–100.0)
MONOS PCT: 13 %
Monocytes Absolute: 0.9 10*3/uL (ref 0.1–1.0)
NEUTROS PCT: 56 %
Neutro Abs: 3.9 10*3/uL (ref 1.7–7.7)
Platelets: 380 10*3/uL (ref 150–400)
RBC: 5.48 MIL/uL (ref 4.22–5.81)
RDW: 14.1 % (ref 11.5–15.5)
WBC: 6.9 10*3/uL (ref 4.0–10.5)

## 2017-12-29 LAB — RAPID URINE DRUG SCREEN, HOSP PERFORMED
AMPHETAMINES: NOT DETECTED
Benzodiazepines: NOT DETECTED
Cocaine: NOT DETECTED
OPIATES: POSITIVE — AB
Tetrahydrocannabinol: POSITIVE — AB

## 2017-12-29 LAB — COMPREHENSIVE METABOLIC PANEL
ALK PHOS: 62 U/L (ref 38–126)
ALT: 19 U/L (ref 0–44)
ANION GAP: 9 (ref 5–15)
AST: 24 U/L (ref 15–41)
Albumin: 4.3 g/dL (ref 3.5–5.0)
BILIRUBIN TOTAL: 1.2 mg/dL (ref 0.3–1.2)
BUN: 6 mg/dL (ref 6–20)
CALCIUM: 9.5 mg/dL (ref 8.9–10.3)
CO2: 29 mmol/L (ref 22–32)
Chloride: 102 mmol/L (ref 98–111)
Creatinine, Ser: 0.77 mg/dL (ref 0.61–1.24)
Glucose, Bld: 110 mg/dL — ABNORMAL HIGH (ref 70–99)
Potassium: 3.8 mmol/L (ref 3.5–5.1)
SODIUM: 140 mmol/L (ref 135–145)
Total Protein: 7.6 g/dL (ref 6.5–8.1)

## 2017-12-29 LAB — URINALYSIS, ROUTINE W REFLEX MICROSCOPIC
BILIRUBIN URINE: NEGATIVE
Glucose, UA: NEGATIVE mg/dL
Hgb urine dipstick: NEGATIVE
Ketones, ur: NEGATIVE mg/dL
Leukocytes, UA: NEGATIVE
NITRITE: NEGATIVE
PH: 8 (ref 5.0–8.0)
Protein, ur: NEGATIVE mg/dL
SPECIFIC GRAVITY, URINE: 1.01 (ref 1.005–1.030)

## 2017-12-29 LAB — LIPASE, BLOOD: Lipase: 78 U/L — ABNORMAL HIGH (ref 11–51)

## 2017-12-29 MED ORDER — ONDANSETRON HCL 4 MG/2ML IJ SOLN
4.0000 mg | Freq: Once | INTRAMUSCULAR | Status: AC
  Administered 2017-12-29: 4 mg via INTRAVENOUS
  Filled 2017-12-29: qty 2

## 2017-12-29 MED ORDER — MORPHINE SULFATE (PF) 4 MG/ML IV SOLN
4.0000 mg | Freq: Once | INTRAVENOUS | Status: AC
  Administered 2017-12-29: 4 mg via INTRAVENOUS
  Filled 2017-12-29: qty 1

## 2017-12-29 MED ORDER — PROMETHAZINE HCL 25 MG PO TABS: 25.0000 mg | ORAL_TABLET | Freq: Four times a day (QID) | ORAL | 0 refills | Status: DC | PRN

## 2017-12-29 MED ORDER — CAPSAICIN-MENTHOL-METHYL SAL 0.025-1-12 % EX CREA: 1.0000 | TOPICAL_CREAM | Freq: Four times a day (QID) | CUTANEOUS | 0 refills | Status: DC | PRN

## 2017-12-29 MED ORDER — SODIUM CHLORIDE 0.9 % IV BOLUS
1000.0000 mL | Freq: Once | INTRAVENOUS | Status: AC
  Administered 2017-12-29: 1000 mL via INTRAVENOUS

## 2017-12-29 MED ORDER — GI COCKTAIL ~~LOC~~
30.0000 mL | Freq: Once | ORAL | Status: AC
  Administered 2017-12-29: 30 mL via ORAL
  Filled 2017-12-29: qty 30

## 2017-12-29 NOTE — ED Triage Notes (Signed)
Abdominal pain and vomiting x 1 week.

## 2017-12-29 NOTE — Discharge Instructions (Signed)
Your symptoms may be due to cannabinoid hyperemesis syndrome.  Take prilosec and zantac 30 minutes before each major meal.  Avoid marijuana use as it may worsen your condition.  Apply capsaicin cream to abdominal wall as needed as it may help. Take phenergan as needed for nausea.

## 2017-12-29 NOTE — ED Provider Notes (Signed)
[facility] EMERGENCY DEPARTMENT Provider Note   CSN: [id number] Arrival date & time: 12/29/17  1422     History   Chief Complaint Chief Complaint  Patient presents with  . Abdominal Pain  . Emesis    HPI Devin Davis is a 32 y.o. male.  HPI   32 year old male presenting for evaluation of abdominal pain.  Patient report for the past week he has had recurrent upper abdominal pain.  Pain is described as a achy sharp sensation, more noticeable at nighttime and keeping him up.  He is eating less, endorsed nausea and vomiting up nonbloody nonbilious content.  Pain is 7 out of 10.  Not improved despite taking NSAIDs, Prilosec, and Zantac.  Bowel movement has been normal, no fever chills no chest pain shortness of breath or productive cough.  Current pain intensity it did radiates towards his mid chest.  He denies any recent marijuana use or alcohol use.  He is a smoker.  No prior abdominal surgery.  Past Medical History:  Diagnosis Date  . Fracture of metacarpal of right hand, closed 08/2010   Comminuted fx @ base  of R 4[facility]    Patient Active Problem List   Diagnosis Date Noted  . TOBACCO USE 11/19/2007  . 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 No pertinent surgical history.      Home Medications    Prior to Admission medications   Medication Sig Start Date End Date Taking? Authorizing Provider  ibuprofen (ADVIL,MOTRIN) 800 MG tablet Take 1 tablet (800 mg total) by mouth 3 (three) times daily. 10/02/16   Fayrene Helperran, Idona Stach, PA-C  meloxicam (MOBIC) 15 MG tablet Take 1 tablet (15 mg total) by mouth daily. 10/28/17   Khatri, Hina, PA-C  ondansetron (ZOFRAN ODT) 8 MG disintegrating tablet Take 1 tablet (8 mg total) by mouth every 8 (eight) hours as needed for nausea or vomiting. 01/22/16   Deatra Canterxford, William J, FNP    Family History No family history on file.  Social History Social History   Tobacco Use  . Smoking status: Current Every Day  Smoker    Packs/day: 1.00  . Smokeless tobacco: Never Used  Substance Use Topics  . Alcohol use: Yes  . Drug use: No     Allergies   Patient has no known allergies.   Review of Systems Review of Systems  All other systems reviewed and are negative.    Physical Exam Updated Vital Signs BP (!) 140/96   Pulse 90   Temp 98.3 F (36.8 C) (Oral)   Resp 18   Ht 5\' 9"  (1.753 m)   Wt 79.4 kg (175 lb)   SpO2 100%   BMI 25.84 kg/m   Physical Exam  Constitutional: He appears well-developed and well-nourished. No distress.  HENT:  Head: Atraumatic.  Eyes: Conjunctivae are normal.  Neck: Neck supple.  Cardiovascular: Normal rate and regular rhythm.  Pulmonary/Chest: Effort normal and breath sounds normal.  Abdominal: Normal appearance and bowel sounds are normal. There is tenderness (Very mild epigastric tenderness without guarding or rebound tenderness.) in the epigastric area.  Neurological: He is alert.  Skin: No rash noted.  Psychiatric: He has a normal mood and affect.  Nursing note and vitals reviewed.    ED Treatments / Results  Labs (all labs ordered are listed, but only abnormal results are displayed) Labs Reviewed  URINALYSIS, ROUTINE W REFLEX MICROSCOPIC - Abnormal; Notable for the following components:  Result Value   APPearance CLOUDY (*)    All other components within normal limits  COMPREHENSIVE METABOLIC PANEL - Abnormal; Notable for the following components:   Glucose, Bld 110 (*)    All other components within normal limits  LIPASE, BLOOD - Abnormal; Notable for the following components:   Lipase 78 (*)    All other components within normal limits  RAPID URINE DRUG SCREEN, HOSP PERFORMED - Abnormal; Notable for the following components:   Opiates POSITIVE (*)    Tetrahydrocannabinol POSITIVE (*)    Barbiturates   (*)    Value: Result not available. Reagent lot number recalled by manufacturer.   All other components within normal limits  CBC  WITH DIFFERENTIAL/PLATELET    EKG None  Radiology No results found.  Procedures Procedures (including critical care time)  Medications Ordered in ED Medications  ondansetron (ZOFRAN) injection 4 mg (4 mg Intravenous Given 12/29/17 1531)  sodium chloride 0.9 % bolus 1,000 mL (0 mLs Intravenous Stopped 12/29/17 1633)  gi cocktail (Maalox,Lidocaine,Donnatal) (30 mLs Oral Given 12/29/17 1531)  morphine 4 MG/ML injection 4 mg (4 mg Intravenous Given 12/29/17 1531)     Initial Impression / Assessment and Plan / ED Course  I have reviewed the triage vital signs and the nursing notes.  Pertinent labs & imaging results that were available during my care of the patient were reviewed by me and considered in my medical decision making (see chart for details).     BP (!) 140/96   Pulse 90   Temp 98.3 F (36.8 C) (Oral)   Resp 18   Ht 5\' 9"  (1.753 m)   Wt 79.4 kg (175 lb)   SpO2 100%   BMI 25.84 kg/m    Final Clinical Impressions(s) / ED Diagnoses   Final diagnoses:  Epigastric pain  Marijuana use    ED Discharge Orders        Ordered    promethazine (PHENERGAN) 25 MG tablet  Every 6 hours PRN     12/29/17 1627    Capsaicin-Menthol-Methyl Sal (CAPSAICIN-METHYL SAL-MENTHOL) 0.025-1-12 % CREA  4 times daily PRN     12/29/17 1627     3:12 PM Pt is here with epigastric abdominal pain intermittent for the past week with decrease in appetite.  Negative Murphy sign:, No pain at McBurney's point concerning for biliary disease or appendicitis.  As well appearing,  Afebrile with stable normal vital sign.  4:32 PM UDS is remarkable for evidence of tetrahydrocannabinol.  Mildly elevated lipase of 78.  Labs otherwise reassuring.  Patient felt better after receiving treatment.  Tolerates p.o.  Encourage patient to avoid marijuana use that it may contribute to his symptoms.  Patient may continue taking Prilosec and Zantac that he has available for potential heartburn.  I have low suspicion  for pancreatitis causing his symptoms. However encourage return if condition worsen, for serial abdominal exam.    Fayrene Helper, PA-C 12/29/17 1635    Tegeler, Canary Brim, MD 12/29/17 [phone]

## 2017-12-30 ENCOUNTER — Emergency Department (HOSPITAL_COMMUNITY): Payer: Self-pay

## 2017-12-30 ENCOUNTER — Encounter (HOSPITAL_COMMUNITY): Payer: Self-pay

## 2017-12-30 ENCOUNTER — Emergency Department (HOSPITAL_COMMUNITY)
Admission: EM | Admit: 2017-12-30 | Discharge: 2017-12-30 | Disposition: A | Discharge status: Home / Self Care | Payer: Self-pay | Attending: Emergency Medicine | Admitting: Emergency Medicine

## 2017-12-30 DIAGNOSIS — Z79899 Other long term (current) drug therapy: Secondary | ICD-10-CM | POA: Insufficient documentation

## 2017-12-30 DIAGNOSIS — R1011 Right upper quadrant pain: Secondary | ICD-10-CM | POA: Insufficient documentation

## 2017-12-30 DIAGNOSIS — R1013 Epigastric pain: Secondary | ICD-10-CM | POA: Insufficient documentation

## 2017-12-30 DIAGNOSIS — F17210 Nicotine dependence, cigarettes, uncomplicated: Secondary | ICD-10-CM | POA: Insufficient documentation

## 2017-12-30 LAB — CBC
HEMATOCRIT: 47.6 % (ref 39.0–52.0)
HEMOGLOBIN: 16.3 g/dL (ref 13.0–17.0)
MCH: 29.9 pg (ref 26.0–34.0)
MCHC: 34.2 g/dL (ref 30.0–36.0)
MCV: 87.2 fL (ref 78.0–100.0)
Platelets: 418 10*3/uL — ABNORMAL HIGH (ref 150–400)
RBC: 5.46 MIL/uL (ref 4.22–5.81)
RDW: 14.1 % (ref 11.5–15.5)
WBC: 7.4 10*3/uL (ref 4.0–10.5)

## 2017-12-30 LAB — COMPREHENSIVE METABOLIC PANEL
ALBUMIN: 4.2 g/dL (ref 3.5–5.0)
ALK PHOS: 58 U/L (ref 38–126)
ALT: 19 U/L (ref 0–44)
ANION GAP: 11 (ref 5–15)
AST: 24 U/L (ref 15–41)
BILIRUBIN TOTAL: 1.5 mg/dL — AB (ref 0.3–1.2)
BUN: 5 mg/dL — AB (ref 6–20)
CALCIUM: 9.8 mg/dL (ref 8.9–10.3)
CO2: 25 mmol/L (ref 22–32)
Chloride: 101 mmol/L (ref 98–111)
Creatinine, Ser: 0.88 mg/dL (ref 0.61–1.24)
GFR calc Af Amer: >60 mL/min (ref >60)
GFR calc non Af Amer: >60 mL/min (ref >60)
Glucose, Bld: 112 mg/dL — ABNORMAL HIGH (ref 70–99)
POTASSIUM: 3.7 mmol/L (ref 3.5–5.1)
SODIUM: 137 mmol/L (ref 135–145)
TOTAL PROTEIN: 7.4 g/dL (ref 6.5–8.1)

## 2017-12-30 LAB — URINALYSIS, ROUTINE W REFLEX MICROSCOPIC
BILIRUBIN URINE: NEGATIVE
GLUCOSE, UA: NEGATIVE mg/dL
Hgb urine dipstick: NEGATIVE
KETONES UR: NEGATIVE mg/dL
Leukocytes, UA: NEGATIVE
NITRITE: NEGATIVE
PH: 9 — AB (ref 5.0–8.0)
Protein, ur: NEGATIVE mg/dL
SPECIFIC GRAVITY, URINE: 1.003 — AB (ref 1.005–1.030)

## 2017-12-30 LAB — LIPASE, BLOOD: Lipase: 29 U/L (ref 11–51)

## 2017-12-30 MED ORDER — GI COCKTAIL ~~LOC~~
30.0000 mL | Freq: Once | ORAL | Status: AC
  Administered 2017-12-30: 30 mL via ORAL
  Filled 2017-12-30: qty 30

## 2017-12-30 MED ORDER — PANTOPRAZOLE SODIUM 20 MG PO TBEC: 20.0000 mg | DELAYED_RELEASE_TABLET | Freq: Every day | ORAL | 0 refills | Status: DC

## 2017-12-30 MED ORDER — ACETAMINOPHEN 500 MG PO TABS
1000.0000 mg | ORAL_TABLET | Freq: Once | ORAL | Status: AC
  Administered 2017-12-30: 1000 mg via ORAL
  Filled 2017-12-30: qty 2

## 2017-12-30 MED ORDER — SUCRALFATE 1 G PO TABS: 1.0000 g | ORAL_TABLET | Freq: Three times a day (TID) | ORAL | 0 refills | Status: DC

## 2017-12-30 NOTE — ED Notes (Signed)
Ultrasound at bedside

## 2017-12-30 NOTE — ED Provider Notes (Signed)
[facility]-EMERGENCY DEPT Provider Note   CSN: [id number] Arrival date & time: 12/30/17  1336     History   Chief Complaint Chief Complaint  Patient presents with  . Abdominal Pain    HPI Devin Davis is a 32 y.o. male.  Patient is a 32 year old male who presents with abdominal pain.  He describes an achy pain across his upper abdomen.  He states is been going on for about the last week but on further questioning, he has had similar pain off and on for the last 2 years.  He states it is not really related to eating.  He denies any fevers.  He occasionally has some nausea and vomiting.  He denies any known acid reflux.  He did have a loose bowel movement 2 days ago but has not had any since that time.  He has an associated bifrontal type headache.  No known fevers.  He was seen yesterday at [facility] for the same symptoms.  He had lab work which was non-concerning other than a slightly elevated lipase.  He is known to use marijuana.  He was counseled yesterday on the downsides of marijuana usage.  He states he takes daily Prilosec which does not help his symptoms.     Past Medical History:  Diagnosis Date  . Fracture of metacarpal of right hand, closed 08/2010   Comminuted fx @ [facility]    Patient Active Problem List   Diagnosis Date Noted  . TOBACCO USE 11/19/2007  . 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 No pertinent surgical history.      Home Medications    Prior to Admission medications   Medication Sig Start Date End Date Taking? Authorizing Provider  Capsaicin-Menthol-Methyl Sal (CAPSAICIN-METHYL SAL-MENTHOL) 0.025-1-12 % CREA Apply 1 Squirt topically 4 (four) times daily as needed. Apply to abdominal wall as needed for abdominal pain 12/29/17   Fayrene Helper, PA-C  ibuprofen (ADVIL,MOTRIN) 800 MG tablet Take 1 tablet (800 mg total) by mouth 3 (three) times daily. Patient not taking: Reported on  12/30/2017 10/02/16   Fayrene Helper, PA-C  meloxicam (MOBIC) 15 MG tablet Take 1 tablet (15 mg total) by mouth daily. Patient not taking: Reported on 12/30/2017 10/28/17   Dietrich Pates, PA-C  ondansetron (ZOFRAN ODT) 8 MG disintegrating tablet Take 1 tablet (8 mg total) by mouth every 8 (eight) hours as needed for nausea or vomiting. Patient not taking: Reported on 12/30/2017 01/22/16   Deatra Canter, FNP  pantoprazole (PROTONIX) 20 MG tablet Take 1 tablet (20 mg total) by mouth daily. 12/30/17   Rolan Bucco, MD  promethazine (PHENERGAN) 25 MG tablet Take 1 tablet (25 mg total) by mouth every 6 (six) hours as needed for nausea. 12/29/17   Fayrene Helper, PA-C  sucralfate (CARAFATE) 1 g tablet Take 1 tablet (1 g total) by mouth 4 (four) times daily -  with meals and at bedtime. 12/30/17   Rolan Bucco, MD    Family History History reviewed. No pertinent family history.  Social History Social History   Tobacco Use  . Smoking status: Current Every Day Smoker    Packs/day: 1.00  . Smokeless tobacco: Never Used  Substance Use Topics  . Alcohol use: Yes  . Drug use: No     Allergies   Patient has no known allergies.   Review of Systems Review of Systems  Constitutional: Negative for chills, diaphoresis, fatigue and fever.  HENT: Negative for congestion, rhinorrhea and sneezing.   Eyes: Negative.   Respiratory: Negative for cough, chest tightness and shortness of breath.   Cardiovascular: Negative for chest pain and leg swelling.  Gastrointestinal: Positive for abdominal pain, nausea and vomiting. Negative for blood in stool and diarrhea.  Genitourinary: Negative for difficulty urinating, flank pain, frequency and hematuria.  Musculoskeletal: Negative for arthralgias and back pain.  Skin: Negative for rash.  Neurological: Negative for dizziness, speech difficulty, weakness, numbness and headaches.     Physical Exam Updated Vital Signs BP (!) 151/71   Pulse (!) 58   Temp 98.5 F  (36.9 C) (Oral)   Resp 17   Ht 5\' 9"  (1.753 m)   Wt 81.6 kg (180 lb)   SpO2 96%   BMI 26.58 kg/m   Physical Exam  Constitutional: He is oriented to person, place, and time. He appears well-developed and well-nourished.  HENT:  Head: Normocephalic and atraumatic.  Eyes: Pupils are equal, round, and reactive to light.  Neck: Normal range of motion. Neck supple.  Cardiovascular: Normal rate, regular rhythm and normal heart sounds.  Pulmonary/Chest: Effort normal and breath sounds normal. No respiratory distress. He has no wheezes. He has no rales. He exhibits no tenderness.  Abdominal: Soft. Bowel sounds are normal. There is tenderness in the epigastric area. There is no rebound and no guarding.  Musculoskeletal: Normal range of motion. He exhibits no edema.  Lymphadenopathy:    He has no cervical adenopathy.  Neurological: He is alert and oriented to person, place, and time.  Skin: Skin is warm and dry. No rash noted.  Psychiatric: He has a normal mood and affect.     ED Treatments / Results  Labs (all labs ordered are listed, but only abnormal results are displayed) Labs Reviewed  COMPREHENSIVE METABOLIC PANEL - Abnormal; Notable for the following components:      Result Value   Glucose, Bld 112 (*)    BUN 5 (*)    Total Bilirubin 1.5 (*)    All other components within normal limits  CBC - Abnormal; Notable for the following components:   Platelets 418 (*)    All other components within normal limits  URINALYSIS, ROUTINE W REFLEX MICROSCOPIC - Abnormal; Notable for the following components:   Color, Urine STRAW (*)    Specific Gravity, Urine 1.003 (*)    pH 9.0 (*)    All other components within normal limits  LIPASE, BLOOD    EKG None  Radiology [place] Abdomen Limited Ruq  Result Date: 12/30/2017 CLINICAL DATA:  Right upper quadrant pain.  Vomiting. EXAM: ULTRASOUND ABDOMEN LIMITED RIGHT UPPER QUADRANT COMPARISON:  None. FINDINGS: Gallbladder: No gallstones or wall  thickening visualized. No sonographic Murphy sign noted by sonographer. Common bile duct: Diameter: 4 mm Liver: No focal lesion identified. Within normal limits in parenchymal echogenicity. Portal vein is patent on color Doppler imaging with normal direction of blood flow towards the liver. IMPRESSION: Normal right upper quadrant ultrasound. Electronically Signed   By: Ted Mcalpineobrinka  Dimitrova M.D.   On: 12/30/2017 17:51    Procedures Procedures (including critical care time)  Medications Ordered in ED Medications  gi cocktail (Maalox,Lidocaine,Donnatal) (30 mLs Oral Given 12/30/17 1659)     Initial Impression / Assessment and Plan / ED Course  I have reviewed the triage vital signs and the nursing notes.  Pertinent labs & imaging results that were available during my care of the patient were reviewed by me and considered in my  medical decision making (see chart for details).     Patient is a 32 year old male who presents with upper abdominal pain.  His lipase is normal.  His other labs are non-concerning.  His LFTs are normal.  He did have a right upper quadrant ultrasound which shows normal gallbladder.  I feel the symptoms are likely related to gastritis.  He was given prescription for Protonix and Carafate.  He already has medication for nausea.  He was given a referral to follow-up with gastroenterology.  Return precautions were given.  Final Clinical Impressions(s) / ED Diagnoses   Final diagnoses:  RUQ pain  Epigastric pain    ED Discharge Orders        Ordered    pantoprazole (PROTONIX) 20 MG tablet  Daily     12/30/17 1759    sucralfate (CARAFATE) 1 g tablet  3 times daily with meals & bedtime     12/30/17 1759       Rolan Bucco, MD 12/30/17 1801

## 2017-12-30 NOTE — ED Triage Notes (Signed)
Pt presents with c/o abdominal pain that started last week. Pt reports vomiting and headache as well with his abdominal pain.

## 2018-04-03 ENCOUNTER — Emergency Department (HOSPITAL_BASED_OUTPATIENT_CLINIC_OR_DEPARTMENT_OTHER)
Admission: EM | Admit: 2018-04-03 | Discharge: 2018-04-04 | Disposition: A | Discharge status: Home / Self Care | Payer: Self-pay | Attending: Emergency Medicine | Admitting: Emergency Medicine

## 2018-04-03 ENCOUNTER — Other Ambulatory Visit: Payer: Self-pay

## 2018-04-03 ENCOUNTER — Encounter (HOSPITAL_BASED_OUTPATIENT_CLINIC_OR_DEPARTMENT_OTHER): Payer: Self-pay

## 2018-04-03 DIAGNOSIS — A64 Unspecified sexually transmitted disease: Secondary | ICD-10-CM | POA: Insufficient documentation

## 2018-04-03 DIAGNOSIS — F17210 Nicotine dependence, cigarettes, uncomplicated: Secondary | ICD-10-CM | POA: Insufficient documentation

## 2018-04-03 LAB — URINALYSIS, MICROSCOPIC (REFLEX)

## 2018-04-03 LAB — URINALYSIS, ROUTINE W REFLEX MICROSCOPIC
Bilirubin Urine: NEGATIVE
Glucose, UA: NEGATIVE mg/dL
Hgb urine dipstick: NEGATIVE
Ketones, ur: NEGATIVE mg/dL
NITRITE: NEGATIVE
PH: 7 (ref 5.0–8.0)
Protein, ur: NEGATIVE mg/dL
SPECIFIC GRAVITY, URINE: 1.02 (ref 1.005–1.030)

## 2018-04-03 MED ORDER — AZITHROMYCIN 1 G PO PACK
1.0000 g | PACK | Freq: Once | ORAL | Status: AC
  Administered 2018-04-04: 1 g via ORAL
  Filled 2018-04-03: qty 1

## 2018-04-03 MED ORDER — CEFTRIAXONE SODIUM 250 MG IJ SOLR
250.0000 mg | Freq: Once | INTRAMUSCULAR | Status: AC
Start: 2018-04-04 — End: 2018-04-04
  Administered 2018-04-04: 250 mg via INTRAMUSCULAR
  Filled 2018-04-03: qty 250

## 2018-04-03 NOTE — ED Notes (Signed)
No answer

## 2018-04-03 NOTE — ED Triage Notes (Addendum)
C/o penile d/c and dysuria x 3-4 days-NAD-steady gait

## 2018-04-04 ENCOUNTER — Encounter (HOSPITAL_BASED_OUTPATIENT_CLINIC_OR_DEPARTMENT_OTHER): Payer: Self-pay | Admitting: Emergency Medicine

## 2018-04-04 LAB — GC/CHLAMYDIA PROBE AMP (~~LOC~~) NOT AT ARMC
Chlamydia: POSITIVE — AB
Neisseria Gonorrhea: POSITIVE — AB

## 2018-04-04 NOTE — ED Provider Notes (Signed)
[facility] EMERGENCY DEPARTMENT Provider Note   CSN: [id number] Arrival date & time: 04/03/18  2233     History   Chief Complaint Chief Complaint  Patient presents with  . Penile Discharge    HPI Devin Davis is a 32 y.o. male.  The history is provided by the patient.  Penile Discharge  This is a new problem. The current episode started more than 2 days ago. The problem occurs constantly. The problem has not changed since onset.Pertinent negatives include no chest pain, no abdominal pain, no headaches and no shortness of breath. Nothing aggravates the symptoms. Nothing relieves the symptoms. He has tried nothing for the symptoms. The treatment provided no relief.    Past Medical History:  Diagnosis Date  . Fracture of metacarpal of right hand, closed 08/2010   Comminuted fx @ [facility]    Patient Active Problem List   Diagnosis Date Noted  . TOBACCO USE 11/19/2007  . 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 No pertinent surgical history.      Home Medications    Prior to Admission medications   Medication Sig Start Date End Date Taking? Authorizing Provider  Capsaicin-Menthol-Methyl Sal (CAPSAICIN-METHYL SAL-MENTHOL) 0.025-1-12 % CREA Apply 1 Squirt topically 4 (four) times daily as needed. Apply to abdominal wall as needed for abdominal pain 12/29/17   Fayrene Helper, PA-C  ibuprofen (ADVIL,MOTRIN) 800 MG tablet Take 1 tablet (800 mg total) by mouth 3 (three) times daily. Patient not taking: Reported on 12/30/2017 10/02/16   Fayrene Helper, PA-C  meloxicam (MOBIC) 15 MG tablet Take 1 tablet (15 mg total) by mouth daily. Patient not taking: Reported on 12/30/2017 10/28/17   Dietrich Pates, PA-C  ondansetron (ZOFRAN ODT) 8 MG disintegrating tablet Take 1 tablet (8 mg total) by mouth every 8 (eight) hours as needed for nausea or vomiting. Patient not taking: Reported on 12/30/2017 01/22/16   Deatra Canter, FNP  pantoprazole (PROTONIX)  20 MG tablet Take 1 tablet (20 mg total) by mouth daily. 12/30/17   Rolan Bucco, MD  promethazine (PHENERGAN) 25 MG tablet Take 1 tablet (25 mg total) by mouth every 6 (six) hours as needed for nausea. 12/29/17   Fayrene Helper, PA-C  sucralfate (CARAFATE) 1 g tablet Take 1 tablet (1 g total) by mouth 4 (four) times daily -  with meals and at bedtime. 12/30/17   Rolan Bucco, MD    Family History No family history on file.  Social History Social History   Tobacco Use  . Smoking status: Current Every Day Smoker    Packs/day: 1.00  . Smokeless tobacco: Never Used  Substance Use Topics  . Alcohol use: Yes    Comment: occ  . Drug use: No     Allergies   Patient has no known allergies.   Review of Systems Review of Systems  Eyes: Negative for photophobia.  Respiratory: Negative for shortness of breath.   Cardiovascular: Negative for chest pain.  Gastrointestinal: Negative for abdominal pain.  Genitourinary: Positive for discharge. Negative for flank pain.  Neurological: Negative for headaches.  All other systems reviewed and are negative.    Physical Exam Updated Vital Signs BP 133/72 (BP Location: Left Arm)   Pulse 83   Temp 99 F (37.2 C) (Oral)   Resp 20   Ht 5\' 9"  (1.753 m)   Wt 85.3 kg   SpO2 99%   BMI 27.76 kg/m   Physical Exam  Constitutional:  He is oriented to person, place, and time. He appears well-developed and well-nourished. No distress.  HENT:  Head: Normocephalic and atraumatic.  Mouth/Throat: No oropharyngeal exudate.  Eyes: EOM are normal.  Neck: Normal range of motion. Neck supple.  Cardiovascular: Normal rate, regular rhythm, normal heart sounds and intact distal pulses.  Pulmonary/Chest: Effort normal and breath sounds normal. No stridor. He has no wheezes. He has no rales.  Abdominal: Soft. Bowel sounds are normal. He exhibits no mass. There is no tenderness. There is no rebound and no guarding.  Musculoskeletal: Normal range of motion.    Neurological: He is alert and oriented to person, place, and time. He displays normal reflexes.  Skin: Skin is warm and dry. Capillary refill takes less than 2 seconds.  Psychiatric: He has a normal mood and affect.     ED Treatments / Results  Labs (all labs ordered are listed, but only abnormal results are displayed) Results for orders placed or performed during the hospital encounter of 04/03/18  Urinalysis, Routine w reflex microscopic  Result Value Ref Range   Color, Urine YELLOW YELLOW   APPearance CLOUDY (A) CLEAR   Specific Gravity, Urine 1.020 1.005 - 1.030   pH 7.0 5.0 - 8.0   Glucose, UA NEGATIVE NEGATIVE mg/dL   Hgb urine dipstick NEGATIVE NEGATIVE   Bilirubin Urine NEGATIVE NEGATIVE   Ketones, ur NEGATIVE NEGATIVE mg/dL   Protein, ur NEGATIVE NEGATIVE mg/dL   Nitrite NEGATIVE NEGATIVE   Leukocytes, UA MODERATE (A) NEGATIVE  Urinalysis, Microscopic (reflex)  Result Value Ref Range   RBC / HPF 0-5 0 - 5 RBC/hpf   WBC, UA 21-50 0 - 5 WBC/hpf   Bacteria, UA RARE (A) NONE SEEN   Squamous Epithelial / LPF 0-5 0 - 5   No results found.  EKG None  Radiology No results found.  Procedures Procedures (including critical care time)  Medications Ordered in ED Medications  cefTRIAXone (ROCEPHIN) injection 250 mg (250 mg Intramuscular Given 04/04/18 0010)  azithromycin (ZITHROMAX) powder 1 g (1 g Oral Given 04/04/18 0009)      Final Clinical Impressions(s) / ED Diagnoses   No sexual activity until 7 days after all partners treated.  You must inform all partners.    Return for weakness, numbness, changes in vision or speech, fevers >100.4 unrelieved by medication, shortness of breath, intractable vomiting, or diarrhea, abdominal pain, Inability to tolerate liquids or food, cough, altered mental status or any concerns. No signs of systemic illness or infection. The patient is nontoxic-appearing on exam and vital signs are within normal limits.    I have  reviewed the triage vital signs and the nursing notes. Pertinent labs &imaging results that were available during my care of the patient were reviewed by me and considered in my medical decision making (see chart for details).  After history, exam, and medical workup I feel the patient has been appropriately medically screened and is safe for discharge home. Pertinent diagnoses were discussed with the patient. Patient was given return precautions.    Edgar Reisz, MD 04/04/18 1610

## 2018-04-04 NOTE — Discharge Instructions (Addendum)
Inform all partners, no sexual activity until 7 days after all partners treated.

## 2018-04-11 ENCOUNTER — Ambulatory Visit (HOSPITAL_COMMUNITY)
Admission: EM | Admit: 2018-04-11 | Discharge: 2018-04-11 | Disposition: A | Discharge status: Home / Self Care | Payer: Self-pay | Attending: Family Medicine | Admitting: Family Medicine

## 2018-04-11 ENCOUNTER — Encounter (HOSPITAL_BASED_OUTPATIENT_CLINIC_OR_DEPARTMENT_OTHER): Payer: Self-pay | Admitting: Emergency Medicine

## 2018-04-11 ENCOUNTER — Other Ambulatory Visit: Payer: Self-pay

## 2018-04-11 ENCOUNTER — Encounter (HOSPITAL_COMMUNITY): Payer: Self-pay | Admitting: Emergency Medicine

## 2018-04-11 ENCOUNTER — Emergency Department (HOSPITAL_BASED_OUTPATIENT_CLINIC_OR_DEPARTMENT_OTHER)
Admission: EM | Admit: 2018-04-11 | Discharge: 2018-04-11 | Disposition: A | Discharge status: Home / Self Care | Payer: Self-pay | Attending: Emergency Medicine | Admitting: Emergency Medicine

## 2018-04-11 DIAGNOSIS — M545 Low back pain: Secondary | ICD-10-CM | POA: Insufficient documentation

## 2018-04-11 DIAGNOSIS — Z5321 Procedure and treatment not carried out due to patient leaving prior to being seen by health care provider: Secondary | ICD-10-CM | POA: Insufficient documentation

## 2018-04-11 DIAGNOSIS — M25511 Pain in right shoulder: Secondary | ICD-10-CM | POA: Insufficient documentation

## 2018-04-11 DIAGNOSIS — M542 Cervicalgia: Secondary | ICD-10-CM | POA: Insufficient documentation

## 2018-04-11 DIAGNOSIS — S161XXA Strain of muscle, fascia and tendon at neck level, initial encounter: Secondary | ICD-10-CM

## 2018-04-11 MED ORDER — DICLOFENAC SODIUM 75 MG PO TBEC: 75.0000 mg | DELAYED_RELEASE_TABLET | Freq: Two times a day (BID) | ORAL | 0 refills | Status: DC

## 2018-04-11 MED ORDER — CYCLOBENZAPRINE HCL 10 MG PO TABS: 10.0000 mg | ORAL_TABLET | Freq: Every evening | ORAL | 0 refills | Status: DC | PRN
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
## 2018-04-11 NOTE — ED Notes (Signed)
Went to bring patient to exam area, unable to locate
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
## 2019-04-09 ENCOUNTER — Encounter (HOSPITAL_BASED_OUTPATIENT_CLINIC_OR_DEPARTMENT_OTHER): Payer: Self-pay | Admitting: Emergency Medicine

## 2019-04-09 ENCOUNTER — Other Ambulatory Visit: Payer: Self-pay

## 2019-04-09 ENCOUNTER — Emergency Department (HOSPITAL_BASED_OUTPATIENT_CLINIC_OR_DEPARTMENT_OTHER)
Admission: EM | Admit: 2019-04-09 | Discharge: 2019-04-09 | Disposition: A | Discharge status: Home / Self Care | Payer: Self-pay | Attending: Emergency Medicine | Admitting: Emergency Medicine

## 2019-04-09 DIAGNOSIS — F17210 Nicotine dependence, cigarettes, uncomplicated: Secondary | ICD-10-CM | POA: Insufficient documentation

## 2019-04-09 DIAGNOSIS — H1031 Unspecified acute conjunctivitis, right eye: Secondary | ICD-10-CM

## 2019-04-09 MED ORDER — FLUORESCEIN SODIUM 1 MG OP STRP
ORAL_STRIP | OPHTHALMIC | Status: AC
  Administered 2019-04-09: 04:00:00
  Filled 2019-04-09: qty 1

## 2019-04-09 MED ORDER — TETRACAINE HCL 0.5 % OP SOLN
OPHTHALMIC | Status: AC
  Administered 2019-04-09: 04:00:00
  Filled 2019-04-09: qty 4

## 2019-04-09 MED ORDER — LIDOCAINE-EPINEPHRINE-TETRACAINE (LET) SOLUTION: 3.0000 mL | Freq: Once | NASAL | Status: DC

## 2019-04-09 MED ORDER — TOBRAMYCIN 0.3 % OP SOLN
2.0000 [drp] | OPHTHALMIC | Status: DC
  Administered 2019-04-09: 04:00:00 2 [drp] via OPHTHALMIC
  Filled 2019-04-09: qty 5

## 2019-04-09 NOTE — ED Triage Notes (Signed)
Patient presents with complaints of right eye redness and pain x 4 days. States minimal drainage noted.

## 2019-04-09 NOTE — ED Provider Notes (Signed)
[facility] EMERGENCY DEPARTMENT Provider Note   CSN: [id number] Arrival date & time: 04/09/19  0327     History   Chief Complaint Chief Complaint  Patient presents with  . Eye Pain    HPI Devin Davis is a 33 y.o. male.     Patient is a 33 year old male with no significant past medical history.  He presents with a several day history of right eye burning, redness, and irritation.  This began in the absence of any injury or trauma.  He feels as though there is something under his eyelid.  He denies any visual disturbances.  The history is provided by the patient.  Eye Pain This is a new problem. Episode onset: Several days ago. The problem occurs constantly. The problem has been gradually worsening. Nothing aggravates the symptoms. Nothing relieves the symptoms. Treatments tried: Visine. The treatment provided no relief.    Past Medical History:  Diagnosis Date  . Fracture of metacarpal of right hand, closed 08/2010   Comminuted fx @ [facility]  [facility]    Patient Active Problem List   Diagnosis Date Noted  . TOBACCO USE 11/19/2007  . 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 No pertinent surgical history.      Home Medications    Prior to Admission medications   Medication Sig Start Date End Date Taking? Authorizing Provider  Capsaicin-Menthol-Methyl Sal (CAPSAICIN-METHYL SAL-MENTHOL) 0.025-1-12 % CREA Apply 1 Squirt topically 4 (four) times daily as needed. Apply to abdominal wall as needed for abdominal pain 12/29/17   Fayrene Helper, PA-C  cyclobenzaprine (FLEXERIL) 10 MG tablet Take 1 tablet (10 mg total) by mouth at bedtime as needed for muscle spasms. 04/11/18   Mardella Layman, MD  diclofenac (VOLTAREN) 75 MG EC tablet Take 1 tablet (75 mg total) by mouth 2 (two) times daily. 04/11/18   Mardella Layman, MD  pantoprazole (PROTONIX) 20 MG tablet Take 1 tablet (20 mg total) by mouth daily. 12/30/17   Rolan Bucco, MD  promethazine  (PHENERGAN) 25 MG tablet Take 1 tablet (25 mg total) by mouth every 6 (six) hours as needed for nausea. 12/29/17   Fayrene Helper, PA-C  sucralfate (CARAFATE) 1 g tablet Take 1 tablet (1 g total) by mouth 4 (four) times daily -  with meals and at bedtime. 12/30/17   Rolan Bucco, MD    Family History History reviewed. No pertinent family history.  Social History Social History   Tobacco Use  . Smoking status: Current Every Day Smoker    Packs/day: 1.00  . Smokeless tobacco: Never Used  Substance Use Topics  . Alcohol use: Yes    Comment: occ  . Drug use: No     Allergies   Patient has no known allergies.   Review of Systems Review of Systems  Eyes: Positive for pain.  All other systems reviewed and are negative.    Physical Exam Updated Vital Signs BP (!) 144/84 (BP Location: Right Arm)   Pulse 85   Temp 98.1 F (36.7 C) (Oral)   Resp 18   Ht 5\' 9"  (1.753 m)   Wt 81.6 kg   SpO2 100%   BMI 26.58 kg/m   Physical Exam Vitals signs and nursing note reviewed.  Constitutional:      General: He is not in acute distress.    Appearance: Normal appearance. He is not ill-appearing.  HENT:     Head: Normocephalic and atraumatic.  Eyes:  Comments: There is conjunctival injection of the right eye.  The cornea is clear to both visual inspection and fluorescein staining.  The lids are inverted and there is no evidence for foreign body.  Pulmonary:     Effort: Pulmonary effort is normal.  Neurological:     Mental Status: He is alert and oriented to person, place, and time.      ED Treatments / Results  Labs (all labs ordered are listed, but only abnormal results are displayed) Labs Reviewed - No data to display  EKG None  Radiology No results found.  Procedures Procedures (including critical care time)  Medications Ordered in ED Medications  lidocaine-EPINEPHrine-tetracaine (LET) solution (has no administration in time range)  fluorescein 1 MG ophthalmic  strip (  Given by Other 04/09/19 0353)  tetracaine (PONTOCAINE) 0.5 % ophthalmic solution (  Given by Other 04/09/19 0353)     Initial Impression / Assessment and Plan / ED Course  I have reviewed the triage vital signs and the nursing notes.  Pertinent labs & imaging results that were available during my care of the patient were reviewed by me and considered in my medical decision making (see chart for details).  This appears to be a conjunctivitis.  This will be treated with antibiotic drops and follow-up as needed.  Final Clinical Impressions(s) / ED Diagnoses   Final diagnoses:  None    ED Discharge Orders    None       Veryl Speak, MD 04/09/19 [id number]

## 2019-04-09 NOTE — Discharge Instructions (Addendum)
Tobrex eyedrops: 2 drops every 4 hours while awake for the next several days.  Follow-up with primary doctor if not improving in the next few days, and return to the ER if symptoms significantly worsen or change.

## 2019-10-09 ENCOUNTER — Encounter (HOSPITAL_COMMUNITY): Payer: Self-pay | Admitting: Emergency Medicine

## 2019-10-09 ENCOUNTER — Emergency Department (HOSPITAL_COMMUNITY): Payer: Self-pay

## 2019-10-09 ENCOUNTER — Other Ambulatory Visit: Payer: Self-pay

## 2019-10-09 ENCOUNTER — Emergency Department (HOSPITAL_COMMUNITY)
Admission: EM | Admit: 2019-10-09 | Discharge: 2019-10-09 | Disposition: A | Discharge status: Home / Self Care | Payer: Self-pay | Attending: Emergency Medicine | Admitting: Emergency Medicine

## 2019-10-09 DIAGNOSIS — W1789XA Other fall from one level to another, initial encounter: Secondary | ICD-10-CM | POA: Insufficient documentation

## 2019-10-09 DIAGNOSIS — S62141A Displaced fracture of body of hamate [unciform] bone, right wrist, initial encounter for closed fracture: Secondary | ICD-10-CM | POA: Insufficient documentation

## 2019-10-09 DIAGNOSIS — S60416A Abrasion of right little finger, initial encounter: Secondary | ICD-10-CM | POA: Insufficient documentation

## 2019-10-09 DIAGNOSIS — F17200 Nicotine dependence, unspecified, uncomplicated: Secondary | ICD-10-CM | POA: Insufficient documentation

## 2019-10-09 DIAGNOSIS — Z79899 Other long term (current) drug therapy: Secondary | ICD-10-CM | POA: Insufficient documentation

## 2019-10-09 DIAGNOSIS — Y999 Unspecified external cause status: Secondary | ICD-10-CM | POA: Insufficient documentation

## 2019-10-09 DIAGNOSIS — Y92812 Truck as the place of occurrence of the external cause: Secondary | ICD-10-CM | POA: Insufficient documentation

## 2019-10-09 DIAGNOSIS — S60414A Abrasion of right ring finger, initial encounter: Secondary | ICD-10-CM | POA: Insufficient documentation

## 2019-10-09 DIAGNOSIS — Y9389 Activity, other specified: Secondary | ICD-10-CM | POA: Insufficient documentation

## 2019-10-09 MED ORDER — TETANUS-DIPHTH-ACELL PERTUSSIS 5-2.5-18.5 LF-MCG/0.5 IM SUSP
0.5000 mL | Freq: Once | INTRAMUSCULAR | Status: DC
  Filled 2019-10-09: qty 0.5

## 2019-10-09 NOTE — ED Triage Notes (Signed)
Patient reports falling out of truck lift and catching himself with R hand at approx 0700 today. States pain increasing and swelling w/ difficulty moving wrist.
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
## 2019-10-09 NOTE — ED Notes (Signed)
Order changed from brace to splint. Ortho tech called to apply splint.

## 2019-10-09 NOTE — Discharge Instructions (Addendum)
Leave splint on, keep splint clean and dry. Take Motrin and Tylenol as needed as directed for pain. Apply ice to hand and elevated for 20 minutes three times daily to help with pain and swelling.  Follow up with the orthopedist- referral given to hand specialist.

## 2019-10-09 NOTE — Progress Notes (Signed)
Orthopedic Tech Progress Note Patient Details:  Devin Davis 11-28-1985 [id number]  Ortho Devices Type of Ortho Device: Post (short arm) splint Ortho Device/Splint Location: Right Wrist Ortho Device/Splint Interventions: Application, Ordered   Post Interventions Patient Tolerated: Well Instructions Provided: Care of device, Adjustment of device   Devin Davis Quentin Shorey 10/09/2019, 2:26 PM

## 2019-10-17 ENCOUNTER — Encounter: Payer: Self-pay | Admitting: Plastic Surgery

## 2019-10-17 ENCOUNTER — Other Ambulatory Visit: Payer: Self-pay

## 2019-10-17 ENCOUNTER — Ambulatory Visit (INDEPENDENT_AMBULATORY_CARE_PROVIDER_SITE_OTHER): Payer: Self-pay | Admitting: Plastic Surgery

## 2019-10-17 VITALS — BP 123/72 | HR 68 | Temp 97.3°F | Ht 69.0 in | Wt 168.4 lb

## 2019-10-17 DIAGNOSIS — S62141A Displaced fracture of body of hamate [unciform] bone, right wrist, initial encounter for closed fracture: Secondary | ICD-10-CM

## 2019-10-17 NOTE — Progress Notes (Signed)
Referring Provider No referring provider defined for this encounter.   CC:  Chief Complaint  Patient presents with  . Advice Only    for hand fracture      Devin Davis is an 33 y.o. male.  HPI: Patient presents as a referral from the emergency room for a right dorsal hamate chip fracture.  He fell while getting out of his truck.  He has some pain on the dorsal ulnar aspect of his wrist since the fall.  He has previously broken his hand and it healed without intervention.  He was splinted in the emergency room but has removed the splint because it was digging into his forearm.  No Known Allergies  Outpatient Encounter Medications as of 10/17/2019  Medication Sig Note  . omeprazole (PRILOSEC OTC) 20 MG tablet Take 20 mg by mouth daily.   . [DISCONTINUED] Capsaicin-Menthol-Methyl Sal (CAPSAICIN-METHYL SAL-MENTHOL) 0.025-1-12 % CREA Apply 1 Squirt topically 4 (four) times daily as needed. Apply to abdominal wall as needed for abdominal pain 12/30/2017: Hasn't filled.  . [DISCONTINUED] cyclobenzaprine (FLEXERIL) 10 MG tablet Take 1 tablet (10 mg total) by mouth at bedtime as needed for muscle spasms.   . [DISCONTINUED] diclofenac (VOLTAREN) 75 MG EC tablet Take 1 tablet (75 mg total) by mouth 2 (two) times daily.   . [DISCONTINUED] pantoprazole (PROTONIX) 20 MG tablet Take 1 tablet (20 mg total) by mouth daily.   . [DISCONTINUED] promethazine (PHENERGAN) 25 MG tablet Take 1 tablet (25 mg total) by mouth every 6 (six) hours as needed for nausea.   . [DISCONTINUED] sucralfate (CARAFATE) 1 g tablet Take 1 tablet (1 g total) by mouth 4 (four) times daily -  with meals and at bedtime.    No facility-administered encounter medications on file as of 10/17/2019.     Past Medical History:  Diagnosis Date  . Fracture of metacarpal of right hand, closed 08/2010   Comminuted fx @ base  of R 4th MC    No past surgical history on file.  No family history on file.  Social History    Social History Narrative  . Not on file     Review of Systems General: Denies fevers, chills, weight loss CV: Denies chest pain, shortness of breath, palpitations  Physical Exam Vitals with BMI 10/17/2019 10/09/2019 10/09/2019  Height 5\' 9"  - 5\' 9"   Weight 168 lbs 6 oz - 180 lbs  BMI 24.86 - 26.57  Systolic 123 114 -  Diastolic 72 59 -  Pulse 68 62 -    General:  No acute distress,  Alert and oriented, Non-Toxic, Normal speech and affect Right hand: Fingers well-perfused with normal capillary refill and a palp radial pulse.  Sensation is intact throughout.  He has full range of motion.  He has a little bit of tenderness over the dorsal ulnar aspect of his wrist.  He is able to flex and extend to around 45 degrees without pain.  X-ray was reviewed and suggest a small dorsal chip fracture of the hamate or triquetrum.  Assessment/Plan Patient presents after a fall from his truck with what is likely an acute small chip fracture from the dorsal aspect of the carpus.  We discussed nonoperative treatment of this with splinting.  Given that his current splint does not fit him very well I will plan to send him for a customized splint that I like him to wear for the next 3 weeks or so.  At that point I will plan to  see him in the office and check him clinically.  I have asked him to avoid activity that causes any pain or discomfort in his hand or wrist.  Cindra Presume 10/17/2019, 12:28 PM

## 2019-10-23 ENCOUNTER — Ambulatory Visit: Payer: Self-pay | Attending: Plastic Surgery | Admitting: *Deleted

## 2019-10-23 ENCOUNTER — Other Ambulatory Visit: Payer: Self-pay

## 2019-10-23 ENCOUNTER — Encounter: Payer: Self-pay | Admitting: *Deleted

## 2019-10-23 DIAGNOSIS — M25531 Pain in right wrist: Secondary | ICD-10-CM | POA: Diagnosis present

## 2019-10-23 DIAGNOSIS — M6281 Muscle weakness (generalized): Secondary | ICD-10-CM | POA: Insufficient documentation

## 2019-10-23 DIAGNOSIS — R601 Generalized edema: Secondary | ICD-10-CM | POA: Insufficient documentation

## 2019-10-23 DIAGNOSIS — M25641 Stiffness of right hand, not elsewhere classified: Secondary | ICD-10-CM | POA: Diagnosis present

## 2019-10-23 DIAGNOSIS — M25631 Stiffness of right wrist, not elsewhere classified: Secondary | ICD-10-CM | POA: Diagnosis present

## 2019-10-23 NOTE — Patient Instructions (Addendum)
WEARING SCHEDULE:  Wear splint at ALL times except for hygiene care.  PURPOSE:  To prevent movement and for protection until injury can heal  CARE OF SPLINT:  Keep splint away from heat sources including: stove, radiator or furnace, or a car in sunlight. The splint can melt and will no longer fit you properly  Keep away from pets and children  Clean the splint with rubbing alcohol as needed. * During this time, make sure you also clean your hand/arm as instructed by your therapist and/or perform dressing changes as needed. Then dry hand/arm completely before replacing splint. (When cleaning hand/arm, keep it immobilized in same position until splint is replaced)  PRECAUTIONS/POTENTIAL PROBLEMS: *If you notice or experience increased pain, swelling, numbness, or a lingering reddened area from the splint: Contact your therapist immediately by calling [phone]. You must wear the splint for protection, but we will get you scheduled for adjustments as quickly as possible.  (If only straps or hooks need to be replaced and NO adjustments to the splint need to be made, just call the office ahead and let them know you are coming in)  If you have any medical concerns or signs of infection, please call your doctor immediately.  Do not use your hand functionally - No lifting, pushing, pulling for daily activity.

## 2019-10-23 NOTE — Therapy (Signed)
[facility] [address] [place], [place], [address] Phone: [phone]   Fax:  [phone]  Occupational Therapy Evaluation  Patient Details  Name: Devin Davis MRN: [id number] Date of Birth: March 18, 1986 Referring Provider (OT): Dr Claudia Desanctis   Encounter Date: 10/23/2019  OT End of Session - 10/23/19 1101    Visit Number  1    Number of Visits  10    Date for OT Re-Evaluation  12/04/19    Authorization Type  Med Pay Primary; Medicaid secondary. Awaiting Medicaid authorization    Authorization - Visit Number  --   Eval on 10/23/2019   OT Start Time  0951    OT Stop Time  1053    OT Time Calculation (min)  62 min    Activity Tolerance  Patient tolerated treatment well    Behavior During Therapy  [facility] for tasks assessed/performed       Past Medical History:  Diagnosis Date  . Fracture of metacarpal of right hand, closed 08/2010   Comminuted fx @ base  of R 4th MC    History reviewed. No pertinent surgical history.  There were no vitals filed for this visit.  Subjective Assessment - 10/23/19 0954    Subjective   Pt is a 34 y/o and tripped and fell when getting out of his truck. He sustained a Right hamate fracture on 10/09/2019 and he is 2 weeks s/p injury today. He presents to the clinic by Dr Claudia Desanctis for custom splinting.    Pertinent History  Non-significant PMH per pt report and chart review.    Limitations  No Range of Motion right wrist    Patient Stated Goals  Be able to use right hand again. Decreased pain.    Currently in Pain?  Yes    Pain Score  7     Pain Location  Wrist    Pain Orientation  Right    Pain Descriptors / Indicators  Aching;Throbbing;Sharp    Pain Type  Acute pain    Pain Onset  1 to 4 weeks ago    Pain Frequency  Intermittent    Aggravating Factors   Pt reports increased pain at noght and or when trying to use right hand    Pain Relieving Factors  Nothing    Multiple Pain Sites  No         [facility] OT Assessment - 10/23/19 0001      Assessment   Medical Diagnosis  Right Hamate chip fracture    Referring Provider (OT)  Dr Claudia Desanctis    Onset Date/Surgical Date  10/09/19    Hand Dominance  Right    Next MD Visit  11/13/2019      Precautions   Precautions  Other (comment)   Hamate fracture   Precaution Comments  No Range of Motion      Restrictions   Weight Bearing Restrictions  Yes    RUE Weight Bearing  Non weight bearing    Other Position/Activity Restrictions  No range of motion      Balance Screen   Has the patient fallen in the past 6 months  Yes    How many times?  1    Has the patient had a decrease in activity level because of a fear of falling?   No    Is the patient reluctant to leave their home because of a fear of falling?   No      Home  Environment  Family/patient expects to be discharged to:  Private residence    Lives With  Family      Prior Function   Level of Independence  Independent      ADL   ADL comments  Modified independent   Using left hand     Mobility   Mobility Status  Independent      Written Expression   Dominant Hand  Right      Sensation   Light Touch  Appears Intact      Coordination   Gross Motor Movements are Fluid and Coordinated  Yes    Fine Motor Movements are Fluid and Coordinated  Not tested      Edema   Edema  Minimal edema dorsal hand/wrist noted upon visual observation in clinic        OT Treatments/Exercises (OP) - 10/23/19 0001      ADLs   ADL Comments  Pt is No range of motion right hand. Pt reports that he can get PRN assistance from his mother for 1 handed ADL's/IADL's. He currently reports being Mod I       Splinting   Splinting  Pt arrived without a splint or brace on his right dominant hand. He was fitted with a custom fabricated volar wrist splint secondary to a right hamate chip fracture (DOI: 10/09/2019). He was educated written & verbally in splinting use, care and precautions. He verbalized  understanding of this in the clinic. It was stressed with the patient that Dr Arita Miss does not want pt to perform active ROM of that wrist at this time and the importance of wearing his splint. He verbalized undertsanding in clinic today.         WEARING SCHEDULE:  Wear splint at ALL times except for hygiene care.  PURPOSE:  To prevent movement and for protection until injury can heal  CARE OF SPLINT:  Keep splint away from heat sources including: stove, radiator or furnace, or a car in sunlight. The splint can melt and will no longer fit you properly  Keep away from pets and children  Clean the splint with rubbing alcohol as needed. * During this time, make sure you also clean your hand/arm as instructed by your therapist and/or perform dressing changes as needed. Then dry hand/arm completely before replacing splint. (When cleaning hand/arm, keep it immobilized in same position until splint is replaced)  PRECAUTIONS/POTENTIAL PROBLEMS: *If you notice or experience increased pain, swelling, numbness, or a lingering reddened area from the splint: Contact your therapist immediately by calling [phone]. You must wear the splint for protection, but we will get you scheduled for adjustments as quickly as possible.  (If only straps or hooks need to be replaced and NO adjustments to the splint need to be made, just call the office ahead and let them know you are coming in)  If you have any medical concerns or signs of infection, please call your doctor immediately.  Do not use your hand functionally - No lifting, pushing, pulling for daily activity.      OT Education - 10/23/19 1059    Education Details  Splinting use, care and precautions. No range of motion R wrist per Dr Marin Roberts) Educated  Patient    Methods  Explanation;Demonstration;Verbal cues;Handout    Comprehension  Verbalized understanding       OT Short Term Goals - 10/23/19 1113      OT SHORT TERM GOAL #1    Title  Pt  will be Mod I splinting use, care and precautions right wrist.    Baseline  Min verbal cues required    Time  3    Period  Weeks    Status  New    Target Date  11/13/19      OT SHORT TERM GOAL #2   Title  Pt will be Mod I edema control techniques R hand/wrist    Baseline  Unable    Time  3    Period  Weeks    Status  New    Target Date  11/13/19        OT Long Term Goals - 10/23/19 1115      OT LONG TERM GOAL #1   Title  Pt will be Mod I home program for right dominant wrist    Baseline  Dependent - No ROM at initial eval (10/23/2019)    Time  6    Period  Weeks    Status  New    Target Date  12/04/19      OT LONG TERM GOAL #2   Title  Pt will be independent ADL's and IADLs as simulated in clinic using right dominant hand    Baseline  Unable - No ROM per orders    Time  6    Period  Weeks    Status  New    Target Date  12/04/19      OT LONG TERM GOAL #3   Title  Pt will report pain as 3/10 or less during light functional use activities in clinc.    Baseline  Unalbe    Time  6    Period  Weeks    Status  New    Target Date  12/04/19      OT LONG TERM GOAL #4   Title  Pt will demonstrate grip R UE as 15# or greater via JAMAR position 2 in preparation for return to functional use R dominant hand.    Baseline  Unable    Time  6    Period  Weeks    Status  New    Target Date  12/04/19            Plan - 10/23/19 1104    Clinical Impression Statement  Pt is a right hand dominant male whom sustained a right hamate fracture (ICD 10: S62.14)  on 10/09/2019 when he tripped getting out of his truck. He was issued a splint in the ED which he removed due to rubbing/irritation. He followed up with Dr Arita Miss last Thursday (10/17/2019) and presents today for custom, protective volar wrist splinting. He will follow up with Dr Arita Miss on 11/13/2019 and also has orders for No ROM. He was fitted with a custom volar protective wrist splint in clinic and splinting use, care and  precautions were reviewed both written and verbally in the clinic today. He was advised to leave this splint on at all times to avoid range of motion and he verbalized understanding in the clinic today. He will benefit from cont out-pt OT to address edema, right wrist pain, and splinting as well as progression of program as pe rhamate healing in his right dominant hand.    OT Occupational Profile and History  Problem Focused Assessment - Including review of records relating to presenting problem    Occupational performance deficits (Please refer to evaluation for details):  ADL's;IADL's    Body Structure / Function / Physical Skills  ADL;ROM;Edema;Mobility;Strength;Coordination;FMC;Decreased knowledge of  precautions;Pain;UE functional use    Rehab Potential  Good    Clinical Decision Making  Limited treatment options, no task modification necessary    Comorbidities Affecting Occupational Performance:  None    Modification or Assistance to Complete Evaluation   No modification of tasks or assist necessary to complete eval    OT Frequency  Other (comment)   1x/week for 3 weeks followed by 2x/week for 3 weeks   OT Duration  6 weeks   see above   OT Treatment/Interventions  Self-care/ADL training;Fluidtherapy;Splinting;Therapeutic activities;Therapeutic exercise;Paraffin;Patient/family education    Plan  Right volar splint check and adjustment as needed.    Consulted and Agree with Plan of Care  Patient       Patient will benefit from skilled therapeutic intervention in order to improve the following deficits and impairments:   Body Structure / Function / Physical Skills: ADL, ROM, Edema, Mobility, Strength, Coordination, FMC, Decreased knowledge of precautions, Pain, UE functional use       Visit Diagnosis: Stiffness of right hand, not elsewhere classified - Plan: Ot plan of care cert/re-cert  Muscle weakness (generalized) - Plan: Ot plan of care cert/re-cert  Stiffness of right wrist, not  elsewhere classified - Plan: Ot plan of care cert/re-cert  Generalized edema - Plan: Ot plan of care cert/re-cert  Pain in right wrist - Plan: Ot plan of care cert/re-cert    Problem List Patient Active Problem List   Diagnosis Date Noted  . TOBACCO USE 11/19/2007  . ACNE VULGARIS, FACIAL, MILD 11/19/2007    Charletta Cousin, Srihaan Mastrangelo Dionicio Stall, OTR/L 10/23/2019, 11:25 AM  [facility] [address] [place], [place], [address] Phone: [phone]   Fax:  [phone]  Name: Devin Davis MRN: [id number] Date of Birth: Oct 03, 1985

## 2019-10-31 ENCOUNTER — Other Ambulatory Visit: Payer: Self-pay

## 2019-10-31 ENCOUNTER — Emergency Department (HOSPITAL_BASED_OUTPATIENT_CLINIC_OR_DEPARTMENT_OTHER)
Admission: EM | Admit: 2019-10-31 | Discharge: 2019-11-01 | Disposition: A | Discharge status: Home / Self Care | Payer: No Typology Code available for payment source | Attending: Emergency Medicine | Admitting: Emergency Medicine

## 2019-10-31 ENCOUNTER — Encounter (HOSPITAL_BASED_OUTPATIENT_CLINIC_OR_DEPARTMENT_OTHER): Payer: Self-pay | Admitting: Emergency Medicine

## 2019-10-31 DIAGNOSIS — S39012A Strain of muscle, fascia and tendon of lower back, initial encounter: Secondary | ICD-10-CM | POA: Diagnosis not present

## 2019-10-31 DIAGNOSIS — Z79899 Other long term (current) drug therapy: Secondary | ICD-10-CM | POA: Diagnosis not present

## 2019-10-31 DIAGNOSIS — Y9389 Activity, other specified: Secondary | ICD-10-CM | POA: Diagnosis not present

## 2019-10-31 DIAGNOSIS — Y9289 Other specified places as the place of occurrence of the external cause: Secondary | ICD-10-CM | POA: Insufficient documentation

## 2019-10-31 DIAGNOSIS — S3992XA Unspecified injury of lower back, initial encounter: Secondary | ICD-10-CM | POA: Diagnosis present

## 2019-10-31 DIAGNOSIS — Y999 Unspecified external cause status: Secondary | ICD-10-CM | POA: Insufficient documentation

## 2019-10-31 DIAGNOSIS — F17210 Nicotine dependence, cigarettes, uncomplicated: Secondary | ICD-10-CM | POA: Insufficient documentation

## 2019-10-31 NOTE — ED Notes (Addendum)
In trying to explain to Pt. That the wait is long but we will get to him as soon as possible.  Pt. Still on his phone and continues to keep the phone on.  Pt. Does not understand the wait and continues to ask about why it takes so long to see someone. Pt. Has people on the phone asking Pt. To ask questions.
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
## 2019-11-01 ENCOUNTER — Emergency Department (HOSPITAL_BASED_OUTPATIENT_CLINIC_OR_DEPARTMENT_OTHER): Payer: No Typology Code available for payment source

## 2019-11-01 MED ORDER — NAPROXEN 375 MG PO TABS: ORAL_TABLET | ORAL | 0 refills | Status: DC

## 2019-11-01 MED ORDER — CYCLOBENZAPRINE HCL 10 MG PO TABS
10.0000 mg | ORAL_TABLET | Freq: Once | ORAL | Status: DC
  Filled 2019-11-01: qty 1

## 2019-11-01 MED ORDER — CYCLOBENZAPRINE HCL 10 MG PO TABS
10.0000 mg | ORAL_TABLET | Freq: Three times a day (TID) | ORAL | 0 refills | Status: DC | PRN
Start: 2019-11-01 — End: 2020-02-29

## 2019-11-01 MED ORDER — NAPROXEN 250 MG PO TABS
500.0000 mg | ORAL_TABLET | Freq: Once | ORAL | Status: DC
  Filled 2019-11-01: qty 2
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
## 2019-11-05 ENCOUNTER — Ambulatory Visit: Payer: Self-pay | Admitting: Occupational Therapy

## 2019-11-11 ENCOUNTER — Ambulatory Visit: Payer: Self-pay | Attending: Plastic Surgery | Admitting: Occupational Therapy

## 2019-11-11 DIAGNOSIS — M25531 Pain in right wrist: Secondary | ICD-10-CM | POA: Insufficient documentation

## 2019-11-11 DIAGNOSIS — M6281 Muscle weakness (generalized): Secondary | ICD-10-CM | POA: Insufficient documentation

## 2019-11-13 ENCOUNTER — Ambulatory Visit: Payer: Self-pay | Admitting: Plastic Surgery

## 2019-11-15 DIAGNOSIS — S62141A Displaced fracture of body of hamate [unciform] bone, right wrist, initial encounter for closed fracture: Secondary | ICD-10-CM | POA: Insufficient documentation

## 2019-11-15 NOTE — Progress Notes (Deleted)
Patient is a 34 yr-old male who present for follow-up after sustaining a right dorsal hamate chip fracture after a fall on 10/09/19. He was initially placed in a split in the ED but was having difficulty wearing it due to ill-fit. At last visit on 4/15 Dr. Arita Miss sent him for a customized splint to wear for 3 weeks.   Today.Marland KitchenMarland Kitchen

## 2019-11-19 ENCOUNTER — Telehealth: Payer: Self-pay

## 2019-11-19 ENCOUNTER — Ambulatory Visit: Payer: Self-pay | Admitting: Occupational Therapy

## 2019-11-19 ENCOUNTER — Other Ambulatory Visit: Payer: Self-pay

## 2019-11-19 NOTE — Therapy (Signed)
[facility] [address] [place], [place], [address] Phone: [phone]   Fax:  [phone]  Occupational Therapy Treatment  Patient Details  Name: Devin Davis MRN: [id number] Date of Birth: 09/17/1985 Referring Provider (OT): Dr Claudia Desanctis   Encounter Date: 11/19/2019  OT End of Session - 11/19/19 0951    Number of Visits  10    Date for OT Re-Evaluation  12/18/19    Authorization Type  Med Pay Primary; Medicaid secondary. Awaiting Medicaid authorization    OT Start Time  [phone]   no charge for this visit   OT Stop Time  0945    OT Time Calculation (min)  10 min       Past Medical History:  Diagnosis Date  . Fracture of metacarpal of right hand, closed 08/2010   Comminuted fx @ base  of R 4[facility]    No past surgical history on file.  There were no vitals filed for this visit.  Subjective Assessment - 11/19/19 0950    Subjective   I thought I was supposed to see the doctor today but it's tomorrow. I have not seen him since the original injury - I missed an appointment with him    Limitations  No Range of Motion right wrist    Patient Stated Goals  Be able to use right hand again. Decreased pain.    Currently in Pain?  Yes    Pain Score  --   mild   Pain Location  Wrist    Pain Orientation  Right    Pain Descriptors / Indicators  Aching       Pt arrived today after not being seen since initial evaluation and splint fabrication. Pt has missed initial follow up with MD but sees PA tomorrow. Pt is almost 6 weeks post injury however waited on issuing A/ROM HEP until PA sees pt to determine if fx has healed.  No charge for today's visit. Pt has appointment for next week                      OT Short Term Goals - 11/19/19 0957      OT SHORT TERM GOAL #1   Title  Pt will be Mod I splinting use, care and precautions right wrist.    Baseline  Min verbal cues required    Time  3    Period   Weeks    Status  Achieved    Target Date  11/13/19      OT SHORT TERM GOAL #2   Title  Pt will be Mod I edema control techniques R hand/wrist    Baseline  Unable    Time  3    Period  Weeks    Status  Achieved    Target Date  11/13/19        OT Long Term Goals - 10/23/19 1115      OT LONG TERM GOAL #1   Title  Pt will be Mod I home program for right dominant wrist    Baseline  Dependent - No ROM at initial eval (10/23/2019)    Time  6    Period  Weeks    Status  New    Target Date  12/04/19      OT LONG TERM GOAL #2   Title  Pt will be independent ADL's and IADLs as simulated in clinic using right dominant hand    Baseline  Unable - No ROM per orders    Time  6    Period  Weeks    Status  New    Target Date  12/04/19      OT LONG TERM GOAL #3   Title  Pt will report pain as 3/10 or less during light functional use activities in clinc.    Baseline  Unalbe    Time  6    Period  Weeks    Status  New    Target Date  12/04/19      OT LONG TERM GOAL #4   Title  Pt will demonstrate grip R UE as 15# or greater via JAMAR position 2 in preparation for return to functional use R dominant hand.    Baseline  Unable    Time  6    Period  Weeks    Status  New    Target Date  12/04/19            Plan - 11/19/19 1751    Clinical Impression Statement  Pt almost 6 weeks post injury today but has missed several appointments and has missed initial follow up with doctor. Pt sees PA tomorrow therefore did not begin A/ROM today as PA will most likely xray to determine if fracture is healed. Will send note via EPIC to PA. Pt has met initial STG's only due to this.    Occupational performance deficits (Please refer to evaluation for details):  ADL's;IADL's    Body Structure / Function / Physical Skills  ADL;ROM;Edema;Mobility;Strength;Coordination;FMC;Decreased knowledge of precautions;Pain;UE functional use    Comorbidities Affecting Occupational Performance:  None    OT  Treatment/Interventions  Self-care/ADL training;Fluidtherapy;Splinting;Therapeutic activities;Therapeutic exercise;Paraffin;Patient/family education    Plan  Hopefully begin A/ROM next week if PA determines fx is healed, check on MCD status    Consulted and Agree with Plan of Care  Patient       Patient will benefit from skilled therapeutic intervention in order to improve the following deficits and impairments:   Body Structure / Function / Physical Skills: ADL, ROM, Edema, Mobility, Strength, Coordination, FMC, Decreased knowledge of precautions, Pain, UE functional use       Visit Diagnosis: Pain in right wrist    Problem List Patient Active Problem List   Diagnosis Date Noted  . Closed displaced fracture of hamate bone of right wrist 11/15/2019  . TOBACCO USE 11/19/2007  . ACNE VULGARIS, FACIAL, MILD 11/19/2007    Carey Bullocks, OTR/L 11/19/2019, 9:58 AM  [facility] [address] [place], [place], [address] Phone: [phone]   Fax:  [phone]  Name: Devin Davis MRN: [id number] Date of Birth: 1986-04-08

## 2019-11-19 NOTE — Telephone Encounter (Signed)
Hello Devin Davis/Dr. Arita Miss,   [name] has missed several O.T. appointments and his initial follow up with Dr. Arita Miss. He came to therapy today for the first time after initial O.T. evaluation and splint fabrication, and is almost 6 weeks post fracture. He sees you tomorrow and I thought it best if he sees you first to determine if the hammate fracture is healed enough before I initiate ROM to wrist. Please let me know after you see him if I can begin A/ROM and P/ROM next week. Also if he can begin to wean from splint. Thank you  Jene Every, OTR/L

## 2019-11-20 ENCOUNTER — Ambulatory Visit: Payer: Medicaid Other | Admitting: Plastic Surgery
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
## 2019-11-21 ENCOUNTER — Ambulatory Visit (INDEPENDENT_AMBULATORY_CARE_PROVIDER_SITE_OTHER): Payer: Self-pay | Admitting: Plastic Surgery

## 2019-11-21 ENCOUNTER — Encounter: Payer: Self-pay | Admitting: Plastic Surgery

## 2019-11-21 ENCOUNTER — Other Ambulatory Visit: Payer: Self-pay

## 2019-11-21 VITALS — BP 143/83 | HR 79 | Temp 97.3°F | Ht 69.0 in | Wt 168.0 lb

## 2019-11-21 DIAGNOSIS — S62141D Displaced fracture of body of hamate [unciform] bone, right wrist, subsequent encounter for fracture with routine healing: Secondary | ICD-10-CM

## 2019-11-21 NOTE — Telephone Encounter (Signed)
Patient reports no pain of wrist.  He can discontinue splint.  Activity as tolerated. Appears to have full AROM of wrist.

## 2019-11-26 ENCOUNTER — Other Ambulatory Visit: Payer: Self-pay

## 2019-11-26 ENCOUNTER — Ambulatory Visit: Payer: Medicaid Other | Admitting: Occupational Therapy

## 2019-11-26 DIAGNOSIS — M6281 Muscle weakness (generalized): Secondary | ICD-10-CM

## 2019-11-26 NOTE — Therapy (Signed)
[facility] [address] [place], [place], [address] Phone: [phone]   Fax:  [phone]  Occupational Therapy Treatment  Patient Details  Name: Devin Davis MRN: [id number] Date of Birth: 04-28-1986 Referring Provider (OT): Dr Claudia Desanctis   Encounter Date: 11/26/2019  OT End of Session - 11/26/19 1006    Visit Number  2    Number of Visits  10    Date for OT Re-Evaluation  12/18/19    Authorization Type  MCD approved 12 visits 5/25 - 01/06/20    Authorization - Visit Number  1    Authorization - Number of Visits  12    OT Start Time  5027    OT Stop Time  1002    OT Time Calculation (min)  27 min    Activity Tolerance  Patient tolerated treatment well    Behavior During Therapy  [facility] for tasks assessed/performed       Past Medical History:  Diagnosis Date  . Fracture of metacarpal of right hand, closed 08/2010   Comminuted fx @ base  of R 4th [facility]    No past surgical history on file.  There were no vitals filed for this visit.  Subjective Assessment - 11/26/19 0939    Subjective   I don't have any pain. Pt also denies pain with HEP    Pertinent History  Non-significant PMH per pt report and chart review.    Limitations  activity as tolerated    Patient Stated Goals  Be able to use right hand again. Decreased pain.    Currently in Pain?  No/denies       Pt returns after seeing PA w/ no restrictions on movement and to increase activity as tolerated.  AROM WNL's as follows: Rt wrist flex = 75*, ext = 65*, RD = 25*, UD = 35*. Rt grip strength = 75 lbs (Lt = 105 lbs)  Pt no longer needs HEP for A/ROM or P/ROM, therefore began light strengthening today - see pt instructions for details. Pt issued green putty for grip and pinch strength. Pt instructed to use 2 lb dumb bell for wrist flex, ext, and RD. Pt cautioned not to exceed this weight until next visit as we will gradually increase. Pt instructed to avoid push  ups, pull ups, anything strenous. Pt agreed. Pt also instructed to stop ex's from HEP if painful and not to exceed recommended frequency. Pt had no pain today with HEP.                     OT Education - 11/26/19 7412    Education Details  Light strengthening HEP (wrist, putty for grip and pinch)    Person(s) Educated  Patient    Methods  Explanation;Demonstration;Verbal cues;Handout    Comprehension  Verbalized understanding;Returned demonstration       OT Short Term Goals - 11/19/19 0957      OT SHORT TERM GOAL #1   Title  Pt will be Mod I splinting use, care and precautions right wrist.    Baseline  Min verbal cues required    Time  3    Period  Weeks    Status  Achieved    Target Date  11/13/19      OT SHORT TERM GOAL #2   Title  Pt will be Mod I edema control techniques R hand/wrist    Baseline  Unable    Time  3  Period  Weeks    Status  Achieved    Target Date  11/13/19        OT Long Term Goals - 11/26/19 1007      OT LONG TERM GOAL #1   Title  Pt will be Mod I home program for right dominant wrist    Baseline  Dependent - No ROM at initial eval (10/23/2019)    Time  6    Period  Weeks    Status  Achieved      OT LONG TERM GOAL #2   Title  Pt will be independent ADL's and IADLs as simulated in clinic using right dominant hand    Baseline  Unable - No ROM per orders    Time  6    Period  Weeks    Status  On-going      OT LONG TERM GOAL #3   Title  Pt will report pain as 3/10 or less during light functional use activities in clinc.    Baseline  Unalbe    Time  6    Period  Weeks    Status  Achieved      OT LONG TERM GOAL #4   Title  Pt will demonstrate grip R UE as 15# or greater via JAMAR position 2 in preparation for return to functional use R dominant hand.    Baseline  Unable    Time  6    Period  Weeks    Status  Achieved   75 lbs           Plan - 11/26/19 1008    Clinical Impression Statement  Pt returns today after  seeing P.A. with clearance to increase activity as tolerated. Pt has full ROM at wrist and able to tolerate light strengthening today with no increase in pain.    Occupational performance deficits (Please refer to evaluation for details):  ADL's;IADL's    Body Structure / Function / Physical Skills  ADL;ROM;Edema;Mobility;Strength;Coordination;FMC;Decreased knowledge of precautions;Pain;UE functional use    Rehab Potential  Good    Comorbidities Affecting Occupational Performance:  None    OT Frequency  2x / week    OT Duration  6 weeks    OT Treatment/Interventions  Self-care/ADL training;Fluidtherapy;Splinting;Therapeutic activities;Therapeutic exercise;Paraffin;Patient/family education    Plan  Re-assess grip strength and upgrade to blue putty if appropriate, increase wrist HEP to 3 lbs if tolerated, possible d/c next session and check remaining goal or will need to make more appts if needed    Consulted and Agree with Plan of Care  Patient       Patient will benefit from skilled therapeutic intervention in order to improve the following deficits and impairments:   Body Structure / Function / Physical Skills: ADL, ROM, Edema, Mobility, Strength, Coordination, FMC, Decreased knowledge of precautions, Pain, UE functional use       Visit Diagnosis: Muscle weakness (generalized)    Problem List Patient Active Problem List   Diagnosis Date Noted  . Closed displaced fracture of hamate bone of right wrist 11/15/2019  . TOBACCO USE 11/19/2007  . ACNE VULGARIS, FACIAL, MILD 11/19/2007    Kelli Churn, OTR/L 11/26/2019, 10:12 AM  [facility] [address] [place], [place], [address] Phone: [phone]   Fax:  [phone]  Name: Devin Davis MRN: [id number] Date of Birth: [birth year]

## 2019-11-26 NOTE — Patient Instructions (Signed)
Extension (Resistive)    With wrist over edge of table, lift __2 lbs, keeping arm on table surface. Hold _3___ seconds. Lower slowly. Repeat __10__ times. Do __2_ sessions per day.   Flexion (Resistive)    With hand palm-up and holding __2 lbs, bend hand toward you at wrist. Hold __3__ seconds. Relax slowly. Repeat _10___ times. Do _2___ sessions per day.  Wrist Radial Deviation: Resisted    With right thumb up, _2___ pound weight in hand, bend wrist up. Return slowly. Repeat __10__ times per set.  Do __2__ sessions per day.  1. Grip Strengthening (Resistive Putty)   Squeeze putty using thumb and all fingers. Repeat _20___ times. Do __2__ sessions per day.   2. Roll putty into tube on table and pinch between first two fingers and thumb x 10 reps each. Do 2 sessions per day.

## 2019-12-03 ENCOUNTER — Other Ambulatory Visit: Payer: Self-pay

## 2019-12-03 ENCOUNTER — Ambulatory Visit: Payer: Self-pay | Admitting: Occupational Therapy

## 2019-12-03 ENCOUNTER — Ambulatory Visit: Payer: Medicaid Other | Attending: Plastic Surgery | Admitting: Occupational Therapy

## 2019-12-03 DIAGNOSIS — M6281 Muscle weakness (generalized): Secondary | ICD-10-CM | POA: Insufficient documentation

## 2019-12-03 NOTE — Therapy (Signed)
[facility] [address] [place], [place], [address] Phone: [phone]   Fax:  [phone]  Occupational Therapy Treatment  Patient Details  Name: Devin Davis MRN: [id number] Date of Birth: March 05, 1986 Referring Provider (OT): Dr Claudia Desanctis   Encounter Date: 12/03/2019  OT End of Session - 12/03/19 1117    Visit Number  3    Number of Visits  10    Date for OT Re-Evaluation  12/18/19    Authorization Type  MCD approved 12 visits 5/25 - 01/06/20    Authorization - Visit Number  2    Authorization - Number of Visits  12    OT Start Time  1100    OT Stop Time  1128    OT Time Calculation (min)  28 min    Activity Tolerance  Patient tolerated treatment well    Behavior During Therapy  [facility] for tasks assessed/performed       Past Medical History:  Diagnosis Date  . Fracture of metacarpal of right hand, closed 08/2010   Comminuted fx @ base  of R 4th [facility]    No past surgical history on file.  There were no vitals filed for this visit.  Subjective Assessment - 12/03/19 1104    Subjective   Pt reports HEP going well with no pain    Pertinent History  Non-significant PMH per pt report and chart review.    Limitations  activity as tolerated    Patient Stated Goals  Be able to use right hand again. Decreased pain.    Currently in Pain?  No/denies      Rt grip strength = 100 lbs. Upgraded to blue resistance putty and issued. Pt return demo of each ex x 20 reps.  Wrist strengthening in flex, ext, and RD w/ 3 lb weight x 10 reps each w/ no concerns or pain. Wrist winder x 4 revolutions w/ 2 lb weight. Gripper set at highest resistance to pick up blocks for sustained grip strength. Pt has met all goals at this time                       OT Short Term Goals - 11/19/19 0957      OT SHORT TERM GOAL #1   Title  Pt will be Mod I splinting use, care and precautions right wrist.    Baseline  Min verbal cues  required    Time  3    Period  Weeks    Status  Achieved    Target Date  11/13/19      OT SHORT TERM GOAL #2   Title  Pt will be Mod I edema control techniques R hand/wrist    Baseline  Unable    Time  3    Period  Weeks    Status  Achieved    Target Date  11/13/19        OT Long Term Goals - 12/03/19 1118      OT LONG TERM GOAL #1   Title  Pt will be Mod I home program for right dominant wrist    Baseline  Dependent - No ROM at initial eval (10/23/2019)    Time  6    Period  Weeks    Status  Achieved      OT LONG TERM GOAL #2   Title  Pt will be independent ADL's and IADLs as simulated in clinic using right dominant hand  Baseline  Unable - No ROM per orders    Time  6    Period  Weeks    Status  Achieved      OT LONG TERM GOAL #3   Title  Pt will report pain as 3/10 or less during light functional use activities in clinc.    Baseline  Unalbe    Time  6    Period  Weeks    Status  Achieved      OT LONG TERM GOAL #4   Title  Pt will demonstrate grip R UE as 15# or greater via JAMAR position 2 in preparation for return to functional use R dominant hand.    Baseline  Unable    Time  6    Period  Weeks    Status  Achieved   75 lbs, 12/03/19 = 100 lbs           Plan - 12/03/19 1119    Clinical Impression Statement  Pt has met all goals at this time. Pt also with increased grip strength from last session. Pt with no further deficits.    Occupational performance deficits (Please refer to evaluation for details):  ADL's;IADL's    Body Structure / Function / Physical Skills  ADL;ROM;Edema;Mobility;Strength;Coordination;FMC;Decreased knowledge of precautions;Pain;UE functional use    Rehab Potential  Good    Comorbidities Affecting Occupational Performance:  None    OT Frequency  2x / week    OT Duration  6 weeks    OT Treatment/Interventions  Self-care/ADL training;Fluidtherapy;Splinting;Therapeutic activities;Therapeutic exercise;Paraffin;Patient/family  education    Plan  D/C O.T.    Consulted and Agree with Plan of Care  Patient       Patient will benefit from skilled therapeutic intervention in order to improve the following deficits and impairments:   Body Structure / Function / Physical Skills: ADL, ROM, Edema, Mobility, Strength, Coordination, FMC, Decreased knowledge of precautions, Pain, UE functional use       Visit Diagnosis: Muscle weakness (generalized)    Problem List Patient Active Problem List   Diagnosis Date Noted  . Closed displaced fracture of hamate bone of right wrist 11/15/2019  . TOBACCO USE 11/19/2007  . ACNE VULGARIS, FACIAL, MILD 11/19/2007     OCCUPATIONAL THERAPY DISCHARGE SUMMARY  Visits from Start of Care: 3  Current functional level related to goals / functional outcomes: See above   Remaining deficits: none   Education / Equipment: HEP's, initial splint wear and care  Plan: Patient agrees to discharge.  Patient goals were met. Patient is being discharged due to meeting the stated rehab goals.  ?????        Carey Bullocks, OTR/L 12/03/2019, 11:44 AM  [facility] [address] [place], [place], [address] Phone: [phone]   Fax:  [phone]  Name: Devin Davis MRN: [id number] Date of Birth: April 08, 1986

## 2020-02-28 ENCOUNTER — Encounter (HOSPITAL_BASED_OUTPATIENT_CLINIC_OR_DEPARTMENT_OTHER): Payer: Self-pay | Admitting: Emergency Medicine

## 2020-02-28 ENCOUNTER — Other Ambulatory Visit: Payer: Self-pay

## 2020-02-28 DIAGNOSIS — R1012 Left upper quadrant pain: Secondary | ICD-10-CM | POA: Insufficient documentation

## 2020-02-28 DIAGNOSIS — F17210 Nicotine dependence, cigarettes, uncomplicated: Secondary | ICD-10-CM | POA: Insufficient documentation

## 2020-02-28 NOTE — ED Triage Notes (Signed)
Pt c/o abd pain x 3 days 

## 2020-02-28 NOTE — ED Notes (Signed)
Pt is now in waiting room.

## 2020-02-29 ENCOUNTER — Emergency Department (HOSPITAL_BASED_OUTPATIENT_CLINIC_OR_DEPARTMENT_OTHER): Payer: Self-pay

## 2020-02-29 ENCOUNTER — Encounter (HOSPITAL_BASED_OUTPATIENT_CLINIC_OR_DEPARTMENT_OTHER): Payer: Self-pay

## 2020-02-29 ENCOUNTER — Emergency Department (HOSPITAL_BASED_OUTPATIENT_CLINIC_OR_DEPARTMENT_OTHER)
Admission: EM | Admit: 2020-02-29 | Discharge: 2020-02-29 | Disposition: A | Discharge status: Home / Self Care | Payer: Self-pay | Attending: Emergency Medicine | Admitting: Emergency Medicine

## 2020-02-29 DIAGNOSIS — R1012 Left upper quadrant pain: Secondary | ICD-10-CM

## 2020-02-29 LAB — CBC WITH DIFFERENTIAL/PLATELET
Abs Immature Granulocytes: 0.01 10*3/uL (ref 0.00–0.07)
Basophils Absolute: 0.1 10*3/uL (ref 0.0–0.1)
Basophils Relative: 1 %
Eosinophils Absolute: 0.3 10*3/uL (ref 0.0–0.5)
Eosinophils Relative: 4 %
HCT: 45.5 % (ref 39.0–52.0)
Hemoglobin: 15.7 g/dL (ref 13.0–17.0)
Immature Granulocytes: 0 %
Lymphocytes Relative: 45 %
Lymphs Abs: 3.3 10*3/uL (ref 0.7–4.0)
MCH: 30.4 pg (ref 26.0–34.0)
MCHC: 34.5 g/dL (ref 30.0–36.0)
MCV: 88.2 fL (ref 80.0–100.0)
Monocytes Absolute: 0.5 10*3/uL (ref 0.1–1.0)
Monocytes Relative: 7 %
Neutro Abs: 3.1 10*3/uL (ref 1.7–7.7)
Neutrophils Relative %: 43 %
Platelets: 432 10*3/uL — ABNORMAL HIGH (ref 150–400)
RBC: 5.16 MIL/uL (ref 4.22–5.81)
RDW: 11.3 % — ABNORMAL LOW (ref 11.5–15.5)
WBC: 7.2 10*3/uL (ref 4.0–10.5)
nRBC: 0 % (ref 0.0–0.2)

## 2020-02-29 LAB — COMPREHENSIVE METABOLIC PANEL
ALT: 21 U/L (ref 0–44)
AST: 21 U/L (ref 15–41)
Albumin: 4.3 g/dL (ref 3.5–5.0)
Alkaline Phosphatase: 72 U/L (ref 38–126)
Anion gap: 11 (ref 5–15)
BUN: 8 mg/dL (ref 6–20)
CO2: 26 mmol/L (ref 22–32)
Calcium: 9.6 mg/dL (ref 8.9–10.3)
Chloride: 99 mmol/L (ref 98–111)
Creatinine, Ser: 0.77 mg/dL (ref 0.61–1.24)
GFR calc Af Amer: >60 mL/min (ref >60)
GFR calc non Af Amer: >60 mL/min (ref >60)
Glucose, Bld: 102 mg/dL — ABNORMAL HIGH (ref 70–99)
Potassium: 3.7 mmol/L (ref 3.5–5.1)
Sodium: 136 mmol/L (ref 135–145)
Total Bilirubin: 1.3 mg/dL — ABNORMAL HIGH (ref 0.3–1.2)
Total Protein: 7.6 g/dL (ref 6.5–8.1)

## 2020-02-29 LAB — URINALYSIS, ROUTINE W REFLEX MICROSCOPIC
Bilirubin Urine: NEGATIVE
Glucose, UA: NEGATIVE mg/dL
Hgb urine dipstick: NEGATIVE
Ketones, ur: NEGATIVE mg/dL
Leukocytes,Ua: NEGATIVE
Nitrite: NEGATIVE
Protein, ur: NEGATIVE mg/dL
Specific Gravity, Urine: 1.02 (ref 1.005–1.030)
pH: 6.5 (ref 5.0–8.0)

## 2020-02-29 LAB — LIPASE, BLOOD: Lipase: 27 U/L (ref 11–51)

## 2020-02-29 MED ORDER — ONDANSETRON HCL 4 MG/2ML IJ SOLN
4.0000 mg | Freq: Once | INTRAMUSCULAR | Status: AC
  Administered 2020-02-29: 4 mg via INTRAVENOUS
  Filled 2020-02-29: qty 2

## 2020-02-29 MED ORDER — OMEPRAZOLE 40 MG PO CPDR
40.0000 mg | DELAYED_RELEASE_CAPSULE | Freq: Every day | ORAL | 1 refills | Status: DC
Start: 2020-02-29 — End: 2020-08-22

## 2020-02-29 MED ORDER — PANTOPRAZOLE SODIUM 40 MG IV SOLR
40.0000 mg | Freq: Once | INTRAVENOUS | Status: AC
  Administered 2020-02-29: 40 mg via INTRAVENOUS
  Filled 2020-02-29: qty 40

## 2020-02-29 MED ORDER — FENTANYL CITRATE (PF) 100 MCG/2ML IJ SOLN
100.0000 ug | Freq: Once | INTRAMUSCULAR | Status: AC
  Administered 2020-02-29: 100 ug via INTRAVENOUS
  Filled 2020-02-29: qty 2

## 2020-02-29 MED ORDER — SUCRALFATE 1 GM/10ML PO SUSP
1.0000 g | Freq: Once | ORAL | Status: AC
  Administered 2020-02-29: 1 g via ORAL
  Filled 2020-02-29: qty 10

## 2020-02-29 MED ORDER — IOHEXOL 300 MG/ML  SOLN
100.0000 mL | Freq: Once | INTRAMUSCULAR | Status: AC | PRN
  Administered 2020-02-29: 100 mL via INTRAVENOUS

## 2020-02-29 MED ORDER — SUCRALFATE 1 G PO TABS
1.0000 g | ORAL_TABLET | Freq: Three times a day (TID) | ORAL | 0 refills | Status: DC
Start: 2020-02-29 — End: 2020-08-22

## 2020-02-29 NOTE — ED Provider Notes (Signed)
[facility] Provider Note: Lowella Dell, MD, FACEP  CSN: [id number] MRN: [id number] ARRIVAL: 02/28/20 at 1916 ROOM: [facility]04/[facility]04   CHIEF COMPLAINT  Abdominal Pain   HISTORY OF PRESENT ILLNESS  02/29/20 1:25 AM Devin Davis is a 34 y.o. male with a 1 to 2-week history of epigastric and left upper quadrant pain.  The pain sometimes radiates into his left lower chest and left back.  It is worse with eating and he rates it as a 10 out of 10.  It has both dull and sharp components.  It is not associated with nausea, vomiting or diarrhea but sometimes he feels like making himself vomit in order to relieve the discomfort.   Past Medical History:  Diagnosis Date  . Fracture of metacarpal of right hand, closed 08/2010   Comminuted fx @ base  of [facility]    History reviewed. No pertinent surgical history.  No family history on file.  Social History   Tobacco Use  . Smoking status: Current Every Day Smoker    Packs/day: 1.00    Types: Cigarettes, Cigars  . Smokeless tobacco: Never Used  Vaping Use  . Vaping Use: Never used  Substance Use Topics  . Alcohol use: Yes    Comment: occ  . Drug use: No    Prior to Admission medications   Medication Sig Start Date End Date Taking? Authorizing Provider  omeprazole (PRILOSEC OTC) 20 MG tablet Take 20 mg by mouth daily.  02/29/20 Yes [provider]  omeprazole (PRILOSEC) 40 MG capsule Take 1 capsule (40 mg total) by mouth daily. 02/29/20   Anandi Abramo, MD  sucralfate (CARAFATE) 1 g tablet Take 1 tablet (1 g total) by mouth 4 (four) times daily -  with meals and at bedtime. 02/29/20   Alexis Reber, Jonny Ruiz, MD    Allergies Patient has no known allergies.   REVIEW OF SYSTEMS  Negative except as noted here or in the History of Present Illness.   PHYSICAL EXAMINATION  Initial Vital Signs Blood pressure 123/85, pulse 85, temperature 98.4 F (36.9 C), temperature source Oral, resp. rate 20, height 5\' 9"  (1.753 m),  weight 72.6 kg, SpO2 99 %.  Examination General: Well-developed, well-nourished male in no acute distress; appearance consistent with age of record HENT: normocephalic; atraumatic Eyes: Normal appearance Neck: supple Heart: regular rate and rhythm Lungs: clear to auscultation bilaterally Abdomen: soft; nondistended; nontender; no masses or hepatosplenomegaly; bowel sounds present Extremities: No deformity; full range of motion; pulses normal Neurologic: Awake, alert and oriented; motor function intact in all extremities and symmetric; no facial droop Skin: Warm and dry Psychiatric: Normal mood and affect   RESULTS  Summary of this visit's results, reviewed and interpreted by myself:   EKG Interpretation  Date/Time:    Ventricular Rate:    PR Interval:    QRS Duration:   QT Interval:    QTC Calculation:   R Axis:     Text Interpretation:        Laboratory Studies: Results for orders placed or performed during the hospital encounter of 02/29/20 (from the past 24 hour(s))  Lipase, blood     Status: None   Collection Time: 02/29/20  1:10 AM  Result Value Ref Range   Lipase 27 11 - 51 U/L  Comprehensive metabolic panel     Status: Abnormal   Collection Time: 02/29/20  1:10 AM  Result Value Ref Range   Sodium 136 135 - 145 mmol/L   Potassium  3.7 3.5 - 5.1 mmol/L   Chloride 99 98 - 111 mmol/L   CO2 26 22 - 32 mmol/L   Glucose, Bld 102 (H) 70 - 99 mg/dL   BUN 8 6 - 20 mg/dL   Creatinine, Ser 3.71 0.61 - 1.24 mg/dL   Calcium 9.6 8.9 - 69.6 mg/dL   Total Protein 7.6 6.5 - 8.1 g/dL   Albumin 4.3 3.5 - 5.0 g/dL   AST 21 15 - 41 U/L   ALT 21 0 - 44 U/L   Alkaline Phosphatase 72 38 - 126 U/L   Total Bilirubin 1.3 (H) 0.3 - 1.2 mg/dL   GFR calc non Af Amer >60 >60 mL/min   GFR calc Af Amer >60 >60 mL/min   Anion gap 11 5 - 15  CBC with Differential     Status: Abnormal   Collection Time: 02/29/20  1:10 AM  Result Value Ref Range   WBC 7.2 4.0 - 10.5 K/uL   RBC 5.16 4.22  - 5.81 MIL/uL   Hemoglobin 15.7 13.0 - 17.0 g/dL   HCT 78.9 39 - 52 %   MCV 88.2 80.0 - 100.0 fL   MCH 30.4 26.0 - 34.0 pg   MCHC 34.5 30.0 - 36.0 g/dL   RDW 38.1 (L) 01.7 - 51.0 %   Platelets 432 (H) 150 - 400 K/uL   nRBC 0.0 0.0 - 0.2 %   Neutrophils Relative % 43 %   Neutro Abs 3.1 1.7 - 7.7 K/uL   Lymphocytes Relative 45 %   Lymphs Abs 3.3 0.7 - 4.0 K/uL   Monocytes Relative 7 %   Monocytes Absolute 0.5 0 - 1 K/uL   Eosinophils Relative 4 %   Eosinophils Absolute 0.3 0 - 0 K/uL   Basophils Relative 1 %   Basophils Absolute 0.1 0 - 0 K/uL   Immature Granulocytes 0 %   Abs Immature Granulocytes 0.01 0.00 - 0.07 K/uL  Urinalysis, Routine w reflex microscopic     Status: None   Collection Time: 02/29/20  1:18 AM  Result Value Ref Range   Color, Urine YELLOW YELLOW   APPearance CLEAR CLEAR   Specific Gravity, Urine 1.020 1.005 - 1.030   pH 6.5 5.0 - 8.0   Glucose, UA NEGATIVE NEGATIVE mg/dL   Hgb urine dipstick NEGATIVE NEGATIVE   Bilirubin Urine NEGATIVE NEGATIVE   Ketones, ur NEGATIVE NEGATIVE mg/dL   Protein, ur NEGATIVE NEGATIVE mg/dL   Nitrite NEGATIVE NEGATIVE   Leukocytes,Ua NEGATIVE NEGATIVE   Imaging Studies: CT ABDOMEN PELVIS W CONTRAST  Result Date: 02/29/2020 CLINICAL DATA:  Left upper quadrant abdominal pain EXAM: CT ABDOMEN AND PELVIS WITH CONTRAST TECHNIQUE: Multidetector CT imaging of the abdomen and pelvis was performed using the standard protocol following bolus administration of intravenous contrast. CONTRAST:  OMNIPAQUE IOHEXOL 300 MG/ML  SOLN COMPARISON:  Ultrasound 12/30/2017 FINDINGS: Lower chest: No acute abnormality. Hepatobiliary: No focal liver abnormality is seen. No gallstones, gallbladder wall thickening, or biliary dilatation. Pancreas: Unremarkable. No pancreatic ductal dilatation or surrounding inflammatory changes. Spleen: Normal in size without focal abnormality. Adrenals/Urinary Tract: Adrenal glands are unremarkable. Kidneys are normal,  without renal calculi, focal lesion, or hydronephrosis. Bladder is unremarkable. Stomach/Bowel: Stomach is within normal limits. Appendix appears normal. No evidence of bowel wall thickening, distention, or inflammatory changes. Vascular/Lymphatic: No significant vascular findings are present. No enlarged abdominal or pelvic lymph nodes. Reproductive: Prostate is unremarkable. Other: No abdominal wall hernia or abnormality. No abdominopelvic ascites. Musculoskeletal: No acute or  significant osseous findings. IMPRESSION: Negative. No CT evidence for acute intra-abdominal or pelvic abnormality. Electronically Signed   By: Jasmine Pang M.D.   On: 02/29/2020 02:49    ED COURSE and MDM  Nursing notes, initial and subsequent vitals signs, including pulse oximetry, reviewed and interpreted by myself.  Vitals:   02/28/20 1940 02/28/20 1941 02/28/20 2211 02/29/20 0111  BP:  (!) 129/91 117/81 123/85  Pulse:  88 63 85  Resp:  16 18 20   Temp:  98.9 F (37.2 C)  98.4 F (36.9 C)  TempSrc:  Oral  Oral  SpO2:  99% 97% 99%  Weight: 72.6 kg     Height: 5\' 9"  (1.753 m)      Medications  sucralfate (CARAFATE) 1 GM/10ML suspension 1 g (has no administration in time range)  pantoprazole (PROTONIX) injection 40 mg (40 mg Intravenous Given 02/29/20 0140)  ondansetron (ZOFRAN) injection 4 mg (4 mg Intravenous Given 02/29/20 0137)  fentaNYL (SUBLIMAZE) injection 100 mcg (100 mcg Intravenous Given 02/29/20 0137)  iohexol (OMNIPAQUE) 300 MG/ML solution 100 mL (100 mLs Intravenous Contrast Given 02/29/20 0218)   The location and nature of the patient's pain is consistent with gastritis or peptic ulcer disease.  We will treat him accordingly and refer to gastroenterology.   PROCEDURES  Procedures   ED DIAGNOSES     ICD-10-CM   1. Left upper quadrant abdominal pain  R10.12        Neda Willenbring, 03/02/20, MD 02/29/20 [phone]

## 2020-08-22 ENCOUNTER — Emergency Department (HOSPITAL_COMMUNITY)
Admission: EM | Admit: 2020-08-22 | Discharge: 2020-08-22 | Payer: Medicaid Other | Attending: Emergency Medicine | Admitting: Emergency Medicine

## 2020-08-22 ENCOUNTER — Encounter (HOSPITAL_COMMUNITY): Payer: Self-pay

## 2020-08-22 ENCOUNTER — Other Ambulatory Visit: Payer: Self-pay

## 2020-08-22 ENCOUNTER — Ambulatory Visit (HOSPITAL_COMMUNITY)
Admission: EM | Admit: 2020-08-22 | Discharge: 2020-08-22 | Disposition: A | Discharge status: Home / Self Care | Payer: No Payment, Other | Attending: Family | Admitting: Family

## 2020-08-22 DIAGNOSIS — E876 Hypokalemia: Secondary | ICD-10-CM

## 2020-08-22 DIAGNOSIS — J029 Acute pharyngitis, unspecified: Secondary | ICD-10-CM | POA: Insufficient documentation

## 2020-08-22 DIAGNOSIS — F15959 Other stimulant use, unspecified with stimulant-induced psychotic disorder, unspecified: Secondary | ICD-10-CM | POA: Insufficient documentation

## 2020-08-22 DIAGNOSIS — F22 Delusional disorders: Secondary | ICD-10-CM

## 2020-08-22 DIAGNOSIS — F15950 Other stimulant use, unspecified with stimulant-induced psychotic disorder with delusions: Secondary | ICD-10-CM | POA: Insufficient documentation

## 2020-08-22 DIAGNOSIS — D75839 Thrombocytosis, unspecified: Secondary | ICD-10-CM | POA: Insufficient documentation

## 2020-08-22 DIAGNOSIS — F600 Paranoid personality disorder: Secondary | ICD-10-CM | POA: Insufficient documentation

## 2020-08-22 DIAGNOSIS — Z20822 Contact with and (suspected) exposure to covid-19: Secondary | ICD-10-CM | POA: Insufficient documentation

## 2020-08-22 DIAGNOSIS — F17210 Nicotine dependence, cigarettes, uncomplicated: Secondary | ICD-10-CM | POA: Insufficient documentation

## 2020-08-22 DIAGNOSIS — R109 Unspecified abdominal pain: Secondary | ICD-10-CM | POA: Insufficient documentation

## 2020-08-22 LAB — URINALYSIS, ROUTINE W REFLEX MICROSCOPIC
Bilirubin Urine: NEGATIVE
Glucose, UA: NEGATIVE mg/dL
Hgb urine dipstick: NEGATIVE
Ketones, ur: NEGATIVE mg/dL
Leukocytes,Ua: NEGATIVE
Nitrite: NEGATIVE
Protein, ur: NEGATIVE mg/dL
Specific Gravity, Urine: 1.011 (ref 1.005–1.030)
pH: 6 (ref 5.0–8.0)

## 2020-08-22 LAB — CBC
HCT: 47.9 % (ref 39.0–52.0)
Hemoglobin: 16.3 g/dL (ref 13.0–17.0)
MCH: 29.8 pg (ref 26.0–34.0)
MCHC: 34 g/dL (ref 30.0–36.0)
MCV: 87.6 fL (ref 80.0–100.0)
Platelets: 414 10*3/uL — ABNORMAL HIGH (ref 150–400)
RBC: 5.47 MIL/uL (ref 4.22–5.81)
RDW: 12.8 % (ref 11.5–15.5)
WBC: 8.2 10*3/uL (ref 4.0–10.5)
nRBC: 0 % (ref 0.0–0.2)

## 2020-08-22 LAB — RESP PANEL BY RT-PCR (FLU A&B, COVID) ARPGX2
Influenza A by PCR: NEGATIVE
Influenza B by PCR: NEGATIVE
SARS Coronavirus 2 by RT PCR: NEGATIVE

## 2020-08-22 LAB — COMPREHENSIVE METABOLIC PANEL
ALT: 22 U/L (ref 0–44)
AST: 30 U/L (ref 15–41)
Albumin: 4.6 g/dL (ref 3.5–5.0)
Alkaline Phosphatase: 78 U/L (ref 38–126)
Anion gap: 11 (ref 5–15)
BUN: <5 mg/dL — ABNORMAL LOW (ref 6–20)
CO2: 25 mmol/L (ref 22–32)
Calcium: 9.7 mg/dL (ref 8.9–10.3)
Chloride: 103 mmol/L (ref 98–111)
Creatinine, Ser: 0.8 mg/dL (ref 0.61–1.24)
GFR, Estimated: >60 mL/min (ref >60)
Glucose, Bld: 109 mg/dL — ABNORMAL HIGH (ref 70–99)
Potassium: 2.7 mmol/L — CL (ref 3.5–5.1)
Sodium: 139 mmol/L (ref 135–145)
Total Bilirubin: 1.4 mg/dL — ABNORMAL HIGH (ref 0.3–1.2)
Total Protein: 7.6 g/dL (ref 6.5–8.1)

## 2020-08-22 LAB — RAPID URINE DRUG SCREEN, HOSP PERFORMED
Amphetamines: POSITIVE — AB
Barbiturates: NOT DETECTED
Benzodiazepines: NOT DETECTED
Cocaine: NOT DETECTED
Opiates: NOT DETECTED
Tetrahydrocannabinol: NOT DETECTED

## 2020-08-22 LAB — LIPASE, BLOOD: Lipase: 25 U/L (ref 11–51)

## 2020-08-22 MED ORDER — ACETAMINOPHEN 325 MG PO TABS: 650.0000 mg | ORAL_TABLET | Freq: Four times a day (QID) | ORAL | Status: DC | PRN

## 2020-08-22 MED ORDER — HYDROXYZINE HCL 25 MG PO TABS: 25.0000 mg | ORAL_TABLET | Freq: Three times a day (TID) | ORAL | Status: DC | PRN

## 2020-08-22 MED ORDER — ALUM & MAG HYDROXIDE-SIMETH 200-200-20 MG/5ML PO SUSP: 30.0000 mL | Freq: Four times a day (QID) | ORAL | Status: DC | PRN

## 2020-08-22 MED ORDER — TRAZODONE HCL 50 MG PO TABS: 50.0000 mg | ORAL_TABLET | Freq: Every evening | ORAL | Status: DC | PRN

## 2020-08-22 MED ORDER — ALUM & MAG HYDROXIDE-SIMETH 200-200-20 MG/5ML PO SUSP: 30.0000 mL | ORAL | Status: DC | PRN

## 2020-08-22 MED ORDER — NICOTINE 14 MG/24HR TD PT24: 14.0000 mg | MEDICATED_PATCH | Freq: Every day | TRANSDERMAL | Status: DC

## 2020-08-22 MED ORDER — GABAPENTIN 100 MG PO CAPS: 200.0000 mg | ORAL_CAPSULE | Freq: Two times a day (BID) | ORAL | Status: DC

## 2020-08-22 MED ORDER — ACETAMINOPHEN 325 MG PO TABS
650.0000 mg | ORAL_TABLET | ORAL | Status: DC | PRN
  Administered 2020-08-22: 650 mg via ORAL
  Filled 2020-08-22: qty 2

## 2020-08-22 MED ORDER — ZIPRASIDONE MESYLATE 20 MG IM SOLR: 20.0000 mg | Freq: Once | INTRAMUSCULAR | Status: DC | PRN

## 2020-08-22 MED ORDER — POTASSIUM CHLORIDE CRYS ER 20 MEQ PO TBCR
40.0000 meq | EXTENDED_RELEASE_TABLET | ORAL | Status: DC
  Administered 2020-08-22: 40 meq via ORAL
  Filled 2020-08-22 (×2): qty 2

## 2020-08-22 MED ORDER — ONDANSETRON HCL 4 MG PO TABS: 4.0000 mg | ORAL_TABLET | Freq: Three times a day (TID) | ORAL | Status: DC | PRN

## 2020-08-22 MED ORDER — MAGNESIUM HYDROXIDE 400 MG/5ML PO SUSP: 30.0000 mL | Freq: Every day | ORAL | Status: DC | PRN

## 2020-08-22 NOTE — ED Notes (Signed)
IVC paperwork completed. [place] police called to transport patient.

## 2020-08-22 NOTE — ED Notes (Signed)
Patient is resting comfortably. 

## 2020-08-22 NOTE — ED Notes (Signed)
Breakfast Ordered 

## 2020-08-22 NOTE — ED Notes (Signed)
Discharge instructions provided and Pt stated understanding. No personal belongings to be returned from a locker. Pt alert, orient and ambulatory. Safety maintained.

## 2020-08-22 NOTE — ED Notes (Signed)
Pt being TTS 

## 2020-08-22 NOTE — ED Notes (Signed)
While taking pt vitals, pt stated that he felt like the door behind him and the portable privacy screen were moving.

## 2020-08-22 NOTE — Discharge Instructions (Signed)
Take all medications as prescribed. Keep all follow-up appointments as scheduled.  Do not consume alcohol or use illegal drugs while on prescription medications. Report any adverse effects from your medications to your primary care provider promptly.  In the event of recurrent symptoms or worsening symptoms, call 911, a crisis hotline, or go to the nearest emergency department for evaluation.   

## 2020-08-22 NOTE — ED Provider Notes (Signed)
[facility] EMERGENCY DEPARTMENT Provider Note   CSN: [id number] Arrival date & time: 08/22/20  0221   History Chief Complaint  Patient presents with  . Abdominal Pain  . Sore Throat  . Psychiatric Evaluation    Devin Davis is a 35 y.o. male.  The history is provided by the patient.  Abdominal Pain Sore Throat Associated symptoms include abdominal pain.  He has no significant past history and comes in complaining of paranoid thoughts for the last 3 days.  He thinks that people are laughing at him and trying to hurt him.  He has had homicidal thoughts of ramming his car into another car that was trying to hurt him.  He has not had similar problems in the past.  He denies any hallucinations.  He denies alcohol or drug use.  He denies any suicidal ideation.  Past Medical History:  Diagnosis Date  . Fracture of metacarpal of right hand, closed 08/2010   Comminuted fx @ [facility]  of [facility]    Patient Active Problem List   Diagnosis Date Noted  . Closed displaced fracture of hamate bone of right wrist 11/15/2019  . TOBACCO USE 11/19/2007  . 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 No pertinent surgical history.     History reviewed. No pertinent family history.  Social History   Tobacco Use  . Smoking status: Current Every Day Smoker    Packs/day: 1.00    Types: Cigarettes, Cigars  . Smokeless tobacco: Never Used  Vaping Use  . Vaping Use: Never used  Substance Use Topics  . Alcohol use: Yes    Comment: occ  . Drug use: No    Home Medications Prior to Admission medications   Medication Sig Start Date End Date Taking? Authorizing Provider  omeprazole (PRILOSEC OTC) 20 MG tablet Take 20 mg by mouth daily.  02/29/20  [provider]  omeprazole (PRILOSEC) 40 MG capsule Take 1 capsule (40 mg total) by mouth daily. 02/29/20   Molpus, John, MD  sucralfate (CARAFATE) 1 g tablet Take 1 tablet (1 g total) by mouth 4  (four) times daily -  with meals and at bedtime. 02/29/20   Molpus, Jonny Ruiz, MD    Allergies    Patient has no known allergies.  Review of Systems   Review of Systems  Gastrointestinal: Positive for abdominal pain.  All other systems reviewed and are negative.   Physical Exam Updated Vital Signs BP (!) 145/85   Pulse 72   Temp 98.1 F (36.7 C)   Resp 18   Ht 5\' 9"  (1.753 m)   Wt 81.6 kg   SpO2 100%   BMI 26.58 kg/m   Physical Exam Vitals and nursing note reviewed.   35 year old male, resting comfortably and in no acute distress. Vital signs are significant for borderline elevated blood pressure. Oxygen saturation is 100%, which is normal. Head is normocephalic and atraumatic. PERRLA, EOMI. Oropharynx is clear. Neck is nontender and supple without adenopathy or JVD. Back is nontender and there is no CVA tenderness. Lungs are clear without rales, wheezes, or rhonchi. Chest is nontender. Heart has regular rate and rhythm without murmur. Abdomen is soft, flat, nontender without masses or hepatosplenomegaly and peristalsis is normoactive. Extremities have no cyanosis or edema, full range of motion is present. Skin is warm and dry without rash. Neurologic: Mental status is normal, cranial nerves are intact, there are no motor or sensory deficits. Psychiatric: Expressing  paranoid ideation.  He is generally slow to answer questions and is warily looking around himself.  At times, he does seem to be responding to internal stimuli.  ED Results / Procedures / Treatments   Labs (all labs ordered are listed, but only abnormal results are displayed) Labs Reviewed  COMPREHENSIVE METABOLIC PANEL - Abnormal; Notable for the following components:      Result Value   Potassium 2.7 (*)    Glucose, Bld 109 (*)    BUN <5 (*)    Total Bilirubin 1.4 (*)    All other components within normal limits  CBC - Abnormal; Notable for the following components:   Platelets 414 (*)    All other  components within normal limits  LIPASE, BLOOD  URINALYSIS, ROUTINE W REFLEX MICROSCOPIC   Procedures Procedures   Medications Ordered in ED Medications  potassium chloride SA (KLOR-CON) CR tablet 40 mEq (has no administration in time range)  acetaminophen (TYLENOL) tablet 650 mg (has no administration in time range)  ondansetron (ZOFRAN) tablet 4 mg (has no administration in time range)  alum & mag hydroxide-simeth (MAALOX/MYLANTA) 200-200-20 MG/5ML suspension 30 mL (has no administration in time range)  nicotine (NICODERM CQ - dosed in mg/24 hours) patch 14 mg (has no administration in time range)    ED Course  I have reviewed the triage vital signs and the nursing notes.  Pertinent lab results that were available during my care of the patient were reviewed by me and considered in my medical decision making (see chart for details).  MDM Rules/Calculators/A&P Paranoid ideation.  Old records are reviewed, and there are no relevant past visits, no prior ED visits or hospitalization for psychiatric illness.  Screening labs show significant hypokalemia, etiology of which is not clear.  He is given oral potassium.  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  Consultation with TTS is requested.  Final Clinical Impression(s) / ED Diagnoses Final diagnoses:  Paranoid ideation (HCC)  Hypokalemia  Thrombocytosis    Rx / DC Orders ED Discharge Orders    None       Dione Booze, MD 08/22/20 [phone]

## 2020-08-22 NOTE — ED Provider Notes (Signed)
Patient signed out to me is pending TTS consultation.  However I was informed by nursing team that patient no longer wants to stay wants to leave.  Upon review of his history this morning he had espouse thoughts of wanting to drive his car into oncoming traffic and having paranoid thoughts of individuals he wanted to hit with his car.  Patient placed on IVC hold.   Cheryll Cockayne, MD 08/22/20 [phone]

## 2020-08-22 NOTE — ED Notes (Addendum)
Pt alert and oriented during admission process. Pt denies SI/HI, A/VH, and any pain. Pt is cooperative. Education, support, reassurance, and encouragement provided. Pt denies any concerns at this time, and verbally contracts for safety. Pt ambulating on the unit with no issues. Pt remains safe on and off the unit. Per Candise Bowens, RN during report, pt's has no belongings because his family took them home. Pt has an ankle monitor but does not have the charger at this time, Pt informed RN that he will call someone to bring the charger from home.

## 2020-08-22 NOTE — BH Assessment (Signed)
Comprehensive Clinical Assessment (CCA) Note  08/22/2020 Devin Davis [id number]   Patient presents to the ED with paranoid delusions, feeling like people are after him and possibly want to hurt him. Patient states that he had thoughts of ramming another car of someone that he thought was trying to hurt him.  Patient denies any previous history of mental illness and states that he has never been paranoid in the past.  Patient denies any drug or alcohol use, but he has tested positive for marijuana in the past and he is currently testing positive for amphetamines.  Patient denties any suicidal or homicidal ideation and states that he has never made any attempts to hurt himself or others.  Patient denies any history of any mental health treatment in the past.  However, looking at his criminal record, it appears that he has a history of drug use and crimes related to his drug use.  He currently has multiple seroious charges pending:  possession, maintaining a dwelling, possession of a firearm by a felon, larceny and DWI and if he is convicted.  Patient states that he has not slept much in the past three nights.  Patient was exhibiting a great deal of thought blocking during his assessment process and was possibly responding to internal stimuli.  Patient states that his appetite has been good.  He denies any history of abuse or self-mutilation.  When asked what his stressors were that could be triggering his paranoia, he was evasive in answering questions.  He appears to be possibly having some relationship issues with his girlfriend, but would not offer any specifics.  When asked what he felt like would help him most, he states, "I do not want to be around people right now."  Due to the fact that patient has no mental health history and is currently paranoid with no prior history of paranoia, patient's current presentation seems most likely attributable to his use of amphetamines.  Patient was very guared  and evasive when asked questions about his substance use.  Patient only admitted to having one beer last night.  Patient presents as alert and oriented.  His mood is somewhat depressed and he seems to be distracted and possibly thought blocking.  He appeared to be at times either responding to internal stimuli or possibly very selective in how he chose to answer questions.  His judgment, insight and impulse control are impaired.  His thoughts are organized and his memory intact.  Chief Complaint:  Chief Complaint  Patient presents with  . Abdominal Pain  . Sore Throat  . Psychiatric Evaluation  . 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 Self  Referral name: Jaesean Litzau  Referral phone number: No data recorded  Whom do you see for routine medical problems? I don't have a doctor  Practice/Facility Name: No data recorded Practice/Facility Phone Number: No data recorded Name of Contact: No data recorded Contact Number: No data recorded Contact Fax Number: No data recorded Prescriber Name: No data recorded Prescriber Address (if known): No data recorded  What Is the Reason for Your Visit/Call Today? Patient presented to the ED stating that he felt like people were after him.  How Long Has This Been Causing You Problems? <Week  What Do You Feel Would Help You the Most Today? Other (Comment) (patient states that he does not need  to be around people)   Have You Recently Been in Any Inpatient Treatment (Hospital/Detox/Crisis Center/28-Day Program)? No  Name/Location of Program/Hospital:No data recorded How Long Were You There? No data recorded When Were You Discharged? No data recorded  Have You Ever Received Services From [facility] Before? No  Who Do You See at [facility]? No data recorded  Have You Recently Had  Any Thoughts About Hurting Yourself? No  Are You Planning to Commit Suicide/Harm Yourself At This time? No   Have you Recently Had Thoughts About Hurting Someone Karolee Ohs? No  Explanation: No data recorded  Have You Used Any Alcohol or Drugs in the Past 24 Hours? No data recorded How Long Ago Did You Use Drugs or Alcohol? No data recorded What Did You Use and How Much? No data recorded  Do You Currently Have a Therapist/Psychiatrist? No  Name of Therapist/Psychiatrist: No data recorded  Have You Been Recently Discharged From Any Office Practice or Programs? No  Explanation of Discharge From Practice/Program: No data recorded    CCA Screening Triage Referral Assessment Type of Contact: Tele-Assessment  Is this Initial or Reassessment? Initial Assessment  Date Telepsych consult ordered in [facility]:  08/22/2020  Time Telepsych consult ordered in [facility]:  0506   Patient Reported Information Reviewed? Yes  Patient Left Without Being Seen? No data recorded Reason for Not Completing Assessment: No data recorded  Collateral Involvement: none available   Does Patient Have a Court Appointed Legal Guardian? No data recorded Name and Contact of Legal Guardian: No data recorded If Minor and Not Living with Parent(s), Who has Custody? No data recorded Is CPS involved or ever been involved? Never  Is APS involved or ever been involved? Never   Patient Determined To Be At Risk for Harm To Self or Others Based on Review of Patient Reported Information or Presenting Complaint? No  Method: No data recorded Availability of Means: No data recorded Intent: No data recorded Notification Required: No data recorded Additional Information for Danger to Others Potential: No data recorded Additional Comments for Danger to Others Potential: No data recorded Are There Guns or Other Weapons in Your Home? No data recorded Types of Guns/Weapons: No data recorded Are These Weapons Safely Secured?                             No data recorded Who Could Verify You Are Able To Have These Secured: No data recorded Do You Have any Outstanding Charges, Pending Court Dates, Parole/Probation? No data recorded Contacted To Inform of Risk of Harm To Self or Others: No data recorded  Location of Assessment: [facility] ED   Does Patient Present under Involuntary Commitment? No  IVC Papers Initial File Date: No data recorded  [place] of Residence: No data recorded  Patient Currently Receiving the Following Services: Not Receiving Services   Determination of Need: Emergent (2 hours)   Options For Referral: Inpatient Hospitalization; Medication Management; Outpatient Therapy; Other: Comment (continuous observatiom)     CCA Biopsychosocial Intake/Chief Complaint:  Patient presents to the ED with paranoid delusions, feeling like people are after him and possibly want to hurt him. Patient states that he had thoughts of ramming another car of someone that he thought was trying to hurt him.  Patient denies any previous history of mental illness and states that he has never been paranoid in the past.  Patient denies any drug or alcohol use, but he has tested  positive for marijuana in the past and he is currently testing positive for amphetamines.  Patient denties any suicidal or homicidal ideation and states that he has never made any attempts to hurt himself or others.  Patient denies any history of any mental health treatment in the past.  However, looking at his criminal record, it appears that he has a history of drug use and crimes related to his drug use.  He currently has multiple seroious charges pending:  possession, maintaining a dwelling, possession of a firearm by a felon, larceny and DWI and if he is convicted.  Patient states that he has not slept much in the past three nights.  Patient was exhibiting a great deal of thought blocking during his assessment process and was possibly responding to internal  stimuli.  Patient states that his appetite has been good.  He denies any history of abuse or self-mutilation.  When asked what his stressors were that could be triggering his paranoia, he was evasive in answering questions.  He appears to be possibly having some relationship issues with his girlfriend, but would not offer any specifics.  When asked what he felt like would help him most, he states, "I do not want to be around people right now."  Current Symptoms/Problems: Patient is paranois and possibly responding to internal stimuli.   Patient Reported Schizophrenia/Schizoaffective Diagnosis in Past: No   Strengths: not assessed  Preferences: Patient is requesting not to have interaction with other people  Abilities: not assessed   Type of Services Patient Feels are Needed: Patient is unsure of the services that he needs   Initial Clinical Notes/Concerns: No data recorded  Mental Health Symptoms Depression:  Sleep (too much or little); Difficulty Concentrating   Duration of Depressive symptoms: Less than two weeks   Mania:  None   Anxiety:   None   Psychosis:  Delusions   Duration of Psychotic symptoms: Less than six months   Trauma:  None   Obsessions:  None   Compulsions:  None   Inattention:  None   Hyperactivity/Impulsivity:  N/A   Oppositional/Defiant Behaviors:  N/A   Emotional Irregularity:  None   Other Mood/Personality Symptoms:  No data recorded   Mental Status Exam Appearance and self-care  Stature:  Average   Weight:  Average weight   Clothing:  Neat/clean; Casual   Grooming:  Well-groomed   Cosmetic use:  None   Posture/gait:  Normal   Motor activity:  Not Remarkable   Sensorium  Attention:  Normal   Concentration:  Normal   Orientation:  Object; Person; Place; Situation; Time   Recall/memory:  Normal   Affect and Mood  Affect:  Constricted; Flat   Mood:  Depressed   Relating  Eye contact:  Normal   Facial expression:   Depressed   Attitude toward examiner:  Cooperative; Guarded (mostly cooperative, but guarded when asked some questions)   Thought and Language  Speech flow: Clear and Coherent   Thought content:  Appropriate to Mood and Circumstances   Preoccupation:  None   Hallucinations:  None   Organization:  No data recorded  [employer] of Knowledge:  Average   Intelligence:  Average   Abstraction:  Normal   Judgement:  Impaired   Reality Testing:  Realistic   Insight:  Lacking   Decision Making:  Impulsive   Social Functioning  Social Maturity:  Responsible   Social Judgement:  "Street Smart"   Stress  Stressors:  Armed forces operational officer;  Relationship   Coping Ability:  Exhausted; Overwhelmed   Skill Deficits:  None   Supports:  Family     Religion: Religion/Spirituality Are You A Religious Person?:  (not assessed)  Leisure/Recreation: Leisure / Recreation Do You Have Hobbies?: No  Exercise/Diet: Exercise/Diet Do You Exercise?: No Have You Gained or Lost A Significant Amount of Weight in the Past Six Months?: No Do You Follow a Special Diet?: No Do You Have Any Trouble Sleeping?: No   CCA Employment/Education Employment/Work Situation: Employment / Work Situation Employment situation: Employed Where is patient currently employed?: [employer] long has patient been employed?: 8 months Patient's job has been impacted by current illness: No What is the longest time patient has a held a job?: not assessed Where was the patient employed at that time?: not assessed  Education: Education Is Patient Currently Attending School?: No Last Grade Completed: 12 Name of High School: [employer] Did [place] Graduate From [employer]?: Yes Did Theme park manager?: Yes What Type of College Degree Do you Have?: Attended [employer], but did not complete a degree Did You Attend Graduate School?: No Did You Have An Individualized Education Program (IIEP): No Did You  Have Any Difficulty At School?: No Patient's Education Has Been Impacted by Current Illness: No   CCA Family/Childhood History Family and Relationship History: Family history Marital status: Single Are you sexually active?: Yes What is your sexual orientation?: heterosexual Has your sexual activity been affected by drugs, alcohol, medication, or emotional stress?: not assessed Does patient have children?: Yes How many children?: 3 How is patient's relationship with their children?: patient states that he is relatively close to his children  Childhood History:  Childhood History By whom was/is the patient raised?:  (not assessed) Additional childhood history information: not assessed Description of patient's relationship with caregiver when they were a child: not assessed Patient's description of current relationship with people who raised him/her: not assessed How were you disciplined when you got in trouble as a child/adolescent?: not assessed Does patient have siblings?:  (not assessed) Did patient suffer any verbal/emotional/physical/sexual abuse as a child?: No Did patient suffer from severe childhood neglect?: No Has patient ever been sexually abused/assaulted/raped as an adolescent or adult?: No Was the patient ever a victim of a crime or a disaster?: No Witnessed domestic violence?: No Has patient been affected by domestic violence as an adult?: No  Child/Adolescent Assessment:     CCA Substance Use Alcohol/Drug Use: Alcohol / Drug Use Pain Medications: see MAR Prescriptions: see MAR Over the Counter: see MAR History of alcohol / drug use?: No history of alcohol / drug abuse (patient denies a history of drug use, but is testing positive for methamphetamine and has tested positive for marijuana in the past.) Longest period of sobriety (when/how long): unable to assess Negative Consequences of Use: Legal,Financial,Personal relationships,Work / School                          ASAM's:  Six Dimensions of Multidimensional Assessment  Dimension 1:  Acute Intoxication and/or Withdrawal Potential:   Dimension 1:  Description of individual's past and current experiences of substance use and withdrawal: Patient has no current signs or sypmtoms or withdrawal  Dimension 2:  Biomedical Conditions and Complications:   Dimension 2:  Description of patient's biomedical conditions and  complications: Patient has no current medical issues  Dimension 3:  Emotional, Behavioral, or Cognitive Conditions and Complications:  Dimension 3:  Description of emotional, behavioral, or cognitive conditions and complications: Patient appears to be self-medicating his mental health issues with drugs and alcohol  Dimension 4:  Readiness to Change:  Dimension 4:  Description of Readiness to Change criteria: Patient appears to have reservations with the steps that he needs to take to recover and is placing limitations on what he is willing to do.  Dimension 5:  Relapse, Continued use, or Continued Problem Potential:  Dimension 5:  Relapse, continued use, or continued problem potential critiera description: Patient has poor coping strategies, he has an extensive history of drug and alcohol use and has no prior treatment history.  Patient is at high risk for relapse.  Dimension 6:  Recovery/Living Environment:  Dimension 6:  Recovery/Iiving environment criteria description: Patient states that he is currently living in a stressful home envirnment and he is also stressed out because legal and relationship issues  ASAM Severity Score: ASAM's Severity Rating Score: 12  ASAM Recommended Level of Treatment: ASAM Recommended Level of Treatment: Level III Residential Treatment   Substance use Disorder (SUD) Substance Use Disorder (SUD)  Checklist Symptoms of Substance Use: Continued use despite having a persistent/recurrent physical/psychological problem caused/exacerbated by use,Continued use  despite persistent or recurrent social, interpersonal problems, caused or exacerbated by use,Evidence of tolerance,Persistent desire or unsuccessful efforts to cut down or control use,Large amounts of time spent to obtain, use or recover from the substance(s),Presence of craving or strong urge to use,Recurrent use that results in a failure to fulfill major role obligations (work, school, home),Substance(s) often taken in larger amounts or over longer times than was intended,Social, occupational, recreational activities given up or reduced due to use  Recommendations for Services/Supports/Treatments: Recommendations for Services/Supports/Treatments Recommendations For Services/Supports/Treatments: Residential-Level 3  DSM5 Diagnoses: Patient Active Problem List   Diagnosis Date Noted  . Amphetamine and psychostimulant-induced psychosis with delusions (HCC)   . Closed displaced fracture of hamate bone of right wrist 11/15/2019  . TOBACCO USE 11/19/2007  . ACNE VULGARIS, FACIAL, MILD 11/19/2007    Disposition:  Per Ophelia ShoulderShnese Mills, NP, patient [facility].  Referrals to Alternative Service(s): Referred to Alternative Service(s):   Place:   Date:   Time:    Referred to Alternative Service(s):   Place:   Date:   Time:    Referred to Alternative Service(s):   Place:   Date:   Time:    Referred to Alternative Service(s):   Place:   Date:   Time:     Natashia Roseman J Arcadia Gorgas, LCAS

## 2020-08-22 NOTE — ED Triage Notes (Signed)
Patient reports abdominal pain and sore throat x 2 days, "I am feeling paranoid, I just don't feel good"

## 2020-08-22 NOTE — ED Provider Notes (Cosign Needed Addendum)
Behavioral Health Urgent Care Medical Screening Exam  Patient Name: Devin Davis MRN: [id number] Date of Evaluation: 08/22/20 Chief Complaint:   Diagnosis:  Final diagnoses:  Paranoid (HCC)   Subjective: Devin Davis stated " I am ready to go, I did some bad Ex."  Evaluation:  Devin Davis is a 35 y.o. male was seen and evaluated face-to-face.  Patient was admitted as a direct admit from [facility] for reported paranoia ideations.  Currently he is requesting to be discharged states I use about ecstasy.  Reports one other time when he started experiencing paranoia with substance abuse use.  Chart reviewed UDS + amphetamines.  He denies suicidal or homicidal ideations.  Denies auditory or visual hallucinations.  Reports he is feeling better. Patient is denying paranoia.  Patient provided verbal authorization to follow-up with his girlfriend [place] at [phone] she denied any safety concerns with patient returning home.  Denies that patient has access to guns or weapons.  States she is able to pick patient up upon discharge prior to going to work.  Patient declined additional outpatient resources for follow-up.  Support, encouragement and  reassurance was provided.   Per admission assessment note: Patient presents to the ED with paranoid delusions, feeling like people are after him and possibly want to hurt him. Patient states that he had thoughts of ramming another car of someone that he thought was trying to hurt him.  Patient denies any previous history of mental illness and states that he has never been paranoid in the past.  Patient denies any drug or alcohol use, but he has tested positive for marijuana in the past and he is currently testing positive for amphetamines.  Patient denties any suicidal or homicidal ideation and states that he has never made any attempts to hurt himself or others.  Patient denies any history of any mental health treatment in the past.   Psychiatric  Specialty Exam  Presentation  General Appearance:Appropriate for Environment  Eye Contact:Good  Speech:Clear and Coherent  Speech Volume:Normal  Handedness:Left   Mood and Affect  Mood:Anxious  Affect:Congruent   Thought Process  Thought Processes:Coherent  Descriptions of Associations:Intact  Orientation:Full (Time, Place and Person)  Thought Content:Logical  Hallucinations:None  Ideas of Reference:Paranoia  Suicidal Thoughts:No  Homicidal Thoughts:No   Sensorium  Memory:Immediate Good; Recent Good; Remote Good  Judgment:Fair  Insight:Fair   Executive Functions  Concentration:Fair  Attention Span:Fair  Recall:Fair  Fund of Knowledge:Fair  Language:Fair   Psychomotor Activity  Psychomotor Activity:Normal   Assets  Assets:Communication Skills; Vocational/Educational; Transportation   Sleep  Sleep:Fair  Number of hours: No data recorded  Physical Exam: Physical Exam Vitals reviewed.  Psychiatric:        Mood and Affect: Mood normal.        Thought Content: Thought content normal.    Review of Systems  Psychiatric/Behavioral: Positive for substance abuse. The patient is nervous/anxious.   All other systems reviewed and are negative.  Blood pressure (!) 151/90, pulse 84, temperature 98.4 F (36.9 C), temperature source Temporal, resp. rate 18, SpO2 100 %. There is no height or weight on file to calculate BMI.  Musculoskeletal: Strength & Muscle Tone: within normal limits Gait & Station: normal Patient leans: N/A   [facility] MSE Discharge Disposition for Follow up and Recommendations: Based on my evaluation the patient does not appear to have an emergency medical condition and can be discharged with resources and follow up care in outpatient services for Medication Management and Substance Abuse  Intensive Outpatient Program   Devin Rack, NP 08/22/2020, 3:57 PM

## 2020-08-22 NOTE — ED Notes (Signed)
PT to be IVC, items in locker 3, valuables with security. Pt wanded.

## 2020-08-22 NOTE — ED Notes (Signed)
Patient came to triage desk stating that he felt like people were watching him, taking pictures, and talking about him in the lobby. Appears to be responding to internal stimuli. Patient states he has been paranoid, unable to maintain eye contract, appears withdrawn. Patient placed in triage.

## 2020-08-22 NOTE — ED Notes (Signed)
Pt placed in [place] scrubs

## 2021-03-02 ENCOUNTER — Encounter (HOSPITAL_COMMUNITY): Payer: Self-pay | Admitting: Emergency Medicine

## 2021-03-02 ENCOUNTER — Emergency Department (HOSPITAL_COMMUNITY)
Admission: EM | Admit: 2021-03-02 | Discharge: 2021-03-02 | Disposition: A | Discharge status: Home / Self Care | Payer: Commercial Managed Care - PPO | Attending: Emergency Medicine | Admitting: Emergency Medicine

## 2021-03-02 DIAGNOSIS — Y92410 Unspecified street and highway as the place of occurrence of the external cause: Secondary | ICD-10-CM | POA: Insufficient documentation

## 2021-03-02 DIAGNOSIS — F17210 Nicotine dependence, cigarettes, uncomplicated: Secondary | ICD-10-CM | POA: Insufficient documentation

## 2021-03-02 DIAGNOSIS — M25511 Pain in right shoulder: Secondary | ICD-10-CM | POA: Insufficient documentation

## 2021-03-02 DIAGNOSIS — M545 Low back pain, unspecified: Secondary | ICD-10-CM | POA: Insufficient documentation

## 2021-03-02 HISTORY — DX: Gastro-esophageal reflux disease without esophagitis: K21.9

## 2021-03-02 MED ORDER — IBUPROFEN 200 MG PO TABS
600.0000 mg | ORAL_TABLET | Freq: Once | ORAL | Status: AC
  Administered 2021-03-02: 600 mg via ORAL
  Filled 2021-03-02: qty 3
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
## 2021-03-02 NOTE — Discharge Instructions (Signed)
Return for any problem.  Use Motrin (Ibuprofen) as instructed - 600 mg taken every 8 hours as needed for pain.
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
## 2021-07-04 DIAGNOSIS — F209 Schizophrenia, unspecified: Secondary | ICD-10-CM

## 2021-07-04 HISTORY — DX: Schizophrenia, unspecified: F20.9

## 2021-10-13 ENCOUNTER — Encounter (HOSPITAL_COMMUNITY): Payer: Self-pay | Admitting: Emergency Medicine

## 2021-10-13 ENCOUNTER — Ambulatory Visit (HOSPITAL_COMMUNITY)
Admission: EM | Admit: 2021-10-13 | Discharge: 2021-10-14 | Disposition: A | Discharge status: Other Acute Inpatient Hospital | Payer: No Payment, Other | Attending: Family | Admitting: Family

## 2021-10-13 DIAGNOSIS — F32A Depression, unspecified: Secondary | ICD-10-CM | POA: Diagnosis not present

## 2021-10-13 DIAGNOSIS — Z79899 Other long term (current) drug therapy: Secondary | ICD-10-CM | POA: Insufficient documentation

## 2021-10-13 DIAGNOSIS — F22 Delusional disorders: Secondary | ICD-10-CM | POA: Insufficient documentation

## 2021-10-13 DIAGNOSIS — F1920 Other psychoactive substance dependence, uncomplicated: Secondary | ICD-10-CM | POA: Diagnosis not present

## 2021-10-13 DIAGNOSIS — Z20822 Contact with and (suspected) exposure to covid-19: Secondary | ICD-10-CM | POA: Insufficient documentation

## 2021-10-13 LAB — COMPREHENSIVE METABOLIC PANEL
ALT: 19 U/L (ref 0–44)
AST: 26 U/L (ref 15–41)
Albumin: 3.9 g/dL (ref 3.5–5.0)
Alkaline Phosphatase: 56 U/L (ref 38–126)
Anion gap: 9 (ref 5–15)
BUN: <5 mg/dL — ABNORMAL LOW (ref 6–20)
CO2: 27 mmol/L (ref 22–32)
Calcium: 9.1 mg/dL (ref 8.9–10.3)
Chloride: 105 mmol/L (ref 98–111)
Creatinine, Ser: 0.83 mg/dL (ref 0.61–1.24)
GFR, Estimated: >60 mL/min (ref >60)
Glucose, Bld: 179 mg/dL — ABNORMAL HIGH (ref 70–99)
Potassium: 2.8 mmol/L — ABNORMAL LOW (ref 3.5–5.1)
Sodium: 141 mmol/L (ref 135–145)
Total Bilirubin: 0.4 mg/dL (ref 0.3–1.2)
Total Protein: 6.5 g/dL (ref 6.5–8.1)

## 2021-10-13 LAB — CBC WITH DIFFERENTIAL/PLATELET
Abs Immature Granulocytes: 0.03 10*3/uL (ref 0.00–0.07)
Basophils Absolute: 0.1 10*3/uL (ref 0.0–0.1)
Basophils Relative: 2 %
Eosinophils Absolute: 0.3 10*3/uL (ref 0.0–0.5)
Eosinophils Relative: 4 %
HCT: 46.1 % (ref 39.0–52.0)
Hemoglobin: 15.2 g/dL (ref 13.0–17.0)
Immature Granulocytes: 0 %
Lymphocytes Relative: 26 %
Lymphs Abs: 2.3 10*3/uL (ref 0.7–4.0)
MCH: 29.9 pg (ref 26.0–34.0)
MCHC: 33 g/dL (ref 30.0–36.0)
MCV: 90.6 fL (ref 80.0–100.0)
Monocytes Absolute: 0.5 10*3/uL (ref 0.1–1.0)
Monocytes Relative: 6 %
Neutro Abs: 5.5 10*3/uL (ref 1.7–7.7)
Neutrophils Relative %: 62 %
Platelets: 405 10*3/uL — ABNORMAL HIGH (ref 150–400)
RBC: 5.09 MIL/uL (ref 4.22–5.81)
RDW: 12.3 % (ref 11.5–15.5)
WBC: 8.7 10*3/uL (ref 4.0–10.5)
nRBC: 0 % (ref 0.0–0.2)

## 2021-10-13 LAB — POCT URINE DRUG SCREEN - MANUAL ENTRY (I-SCREEN)
POC Amphetamine UR: NOT DETECTED
POC Buprenorphine (BUP): NOT DETECTED
POC Cocaine UR: NOT DETECTED
POC Marijuana UR: NOT DETECTED
POC Methadone UR: NOT DETECTED
POC Methamphetamine UR: NOT DETECTED
POC Morphine: NOT DETECTED
POC Oxazepam (BZO): NOT DETECTED
POC Oxycodone UR: NOT DETECTED
POC Secobarbital (BAR): NOT DETECTED

## 2021-10-13 LAB — LIPID PANEL
Cholesterol: 131 mg/dL (ref 0–200)
HDL: 60 mg/dL (ref >40)
LDL Cholesterol: 63 mg/dL (ref 0–99)
Total CHOL/HDL Ratio: 2.2 RATIO
Triglycerides: 38 mg/dL (ref <150)
VLDL: 8 mg/dL (ref 0–40)

## 2021-10-13 LAB — RESP PANEL BY RT-PCR (FLU A&B, COVID) ARPGX2
Influenza A by PCR: NEGATIVE
Influenza B by PCR: NEGATIVE
SARS Coronavirus 2 by RT PCR: NEGATIVE

## 2021-10-13 LAB — TSH: TSH: 0.35 u[IU]/mL (ref 0.350–4.500)

## 2021-10-13 LAB — HEMOGLOBIN A1C
Hgb A1c MFr Bld: 5.6 % (ref 4.8–5.6)
Mean Plasma Glucose: 114.02 mg/dL

## 2021-10-13 MED ORDER — OLANZAPINE 5 MG PO TABS
5.0000 mg | ORAL_TABLET | Freq: Two times a day (BID) | ORAL | Status: DC
  Administered 2021-10-13 (×2): 5 mg via ORAL
  Filled 2021-10-13 (×2): qty 1

## 2021-10-13 MED ORDER — MAGNESIUM HYDROXIDE 400 MG/5ML PO SUSP: 30.0000 mL | Freq: Every day | ORAL | Status: DC | PRN

## 2021-10-13 MED ORDER — HYDROXYZINE HCL 25 MG PO TABS
25.0000 mg | ORAL_TABLET | Freq: Three times a day (TID) | ORAL | Status: DC | PRN
  Administered 2021-10-13 (×2): 25 mg via ORAL
  Filled 2021-10-13 (×2): qty 1

## 2021-10-13 MED ORDER — ALUM & MAG HYDROXIDE-SIMETH 200-200-20 MG/5ML PO SUSP: 30.0000 mL | ORAL | Status: DC | PRN

## 2021-10-13 MED ORDER — TRAZODONE HCL 50 MG PO TABS
50.0000 mg | ORAL_TABLET | Freq: Every evening | ORAL | Status: DC | PRN
  Administered 2021-10-13: 50 mg via ORAL
  Filled 2021-10-13: qty 1

## 2021-10-13 MED ORDER — ACETAMINOPHEN 325 MG PO TABS: 650.0000 mg | ORAL_TABLET | Freq: Four times a day (QID) | ORAL | Status: DC | PRN

## 2021-10-13 NOTE — ED Notes (Signed)
Pt awake, alert & responsive, no distress noted, resting on bed at present. Calm & cooperative.  Monitoring for safety. ?

## 2021-10-13 NOTE — Discharge Instructions (Addendum)
Transfer to [facility] 
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
## 2021-10-13 NOTE — ED Notes (Signed)
Pt sleeping at present, no distress noted, respirations even & unlabored.  Monitoring for safety. ?

## 2021-10-13 NOTE — ED Notes (Signed)
Patient admitted to observation unit concerning psychosis. Patient cooperative, tearful, with flat affect and slow steady gait. Patient appears guarded with brief eye contact. Patient A&Ox4. Patient denies SI and HI but does state hearing voices in his head "at times." Patient shared feeling "scared" that someone is after him. Skin assessment completed and is WDL. Belongings and patient searched. No contraband found. Belongings secured and placed in locker 17. Patient provided with meal and hygiene supplies for shower. Patient given atarax po prn to help with current anxiety. Patient oriented to unit and in no current distress.  ?

## 2021-10-13 NOTE — ED Provider Notes (Signed)
[facility] Urgent Care Continuous Assessment Admission H&P ? ?Date: 10/13/21 ?Patient Name: Devin Davis ?MRN: [id number] ?Chief Complaint:  ?Chief Complaint  ?Patient presents with  ? Addiction Problem  ? Delusional  ? auditory hallucinations  ?   ? ?Diagnoses:  ?Final diagnoses:  ?Paranoid behavior (HCC)  ? ? ?HPI: Devin Davis is a 36 year old African-American male who presents to [facility] urgent care accompanied by his mother who reports worsening paranoia and bizarre behavior.  She reports patient has been under a lot of stress mainly related to his children's mother. "  He has been paying child support however is not able to see his kids."  Patient does report mild depression however states using Xanax to help him sleep at night.  He denies any other illicit drug use.  Reports auditory hallucinations " I can hear everything everybody says acute repeating in my head."  ? ?Mother reports patient was recently fired from his job. "  Probably due to his behavior."  Patient is adamant about illicit drug use/abuse.  Denied that he is followed by therapy or psychiatry.  Charted history of paranoid ideations in the past.  Denies the voices are command in nature.  Patient to be admitted for overnight observation will initiate Zyprexa 5 mg p.o. twice daily for mood stabilization.  Patient was receptive to plan.  ? ?During evaluation [name] is pacing and appears to be in moderate distress.  He is alert/oriented x 3.  calm/cooperative; and mood congruent with affect. Anxious and paranoid. He is speaking in a clear tone at moderate volume, and normal pace; with good eye contact. His thought process is coherent and relevant; he reported responding to internal stimuli and paranoia " that people are watching me."   ?Patient has remained calm throughout assessment and has answered questions appropriately.   ? ? ? ?PHQ 2-9:  ?Flowsheet Row ED from 08/22/2020 in [facility] EMERGENCY  DEPARTMENT  ?Thoughts that you would be better off dead, or of hurting yourself in some way Several days  ?PHQ-9 Total Score 15  ? ?  ?  ?Flowsheet Row ED from 03/02/2021 in [facility] ?Most recent reading at 03/02/2021 11:04 AM ED from 08/22/2020 in [facility] ?Most recent reading at 08/22/2020  3:19 PM ED from 08/22/2020 in [facility] EMERGENCY DEPARTMENT ?Most recent reading at 08/22/2020  2:27 AM  ?C-SSRS RISK CATEGORY No Risk High Risk No Risk  ? ?  ?  ? ?Total Time spent with patient: 15 minutes ? ?Musculoskeletal  ?Strength & Muscle Tone: within normal limits ?Gait & Station: normal ?Patient leans: N/A ? ?Psychiatric Specialty Exam  ?Presentation ?General Appearance: Bizarre ? ?Eye Contact:Good ? ?Speech:Clear and Coherent ? ?Speech Volume:Normal ? ?Handedness:Right ? ? ?Mood and Affect  ?Mood:Depressed; Anxious; Irritable ? ?Affect:Congruent ? ? ?Thought Process  ?Thought Processes:Coherent ? ?Descriptions of Associations:Intact ? ?Orientation:Full (Time, Place and Person) ? ?Thought Content:Logical ? Diagnosis of Schizophrenia or Schizoaffective disorder in past: No ? Duration of Psychotic Symptoms: Less than six months ? ?Hallucinations:Hallucinations: Auditory ? ?Ideas of Reference:Paranoia ? ?Suicidal Thoughts:Suicidal Thoughts: No ? ?Homicidal Thoughts:Homicidal Thoughts: No ? ? ?Sensorium  ?Memory:Immediate Fair; Recent Fair ? ?Judgment:Fair ? ?Insight:No data recorded ? ?Executive Functions  ?Concentration:Fair ? ?Attention Span:Fair ? ?Recall:Fair ? ?Fund of Knowledge:Good ? ?Language:Good ? ? ?Psychomotor Activity  ?Psychomotor Activity:Psychomotor Activity: Normal ? ? ?Assets  ?Assets:Intimacy; Social Support ? ? ?Sleep  ?Sleep:Sleep: Fair ? ? ?Nutritional  Assessment (For OBS and FBC admissions only) ?Has the patient had a weight loss or gain of 10 pounds or more in the last 3 months?: No ?Has the patient had a decrease in  food intake/or appetite?: No ?Does the patient have dental problems?: No ?Does the patient have eating habits or behaviors that may be indicators of an eating disorder including binging or inducing vomiting?: No ?Has the patient recently lost weight without trying?: 0 ?Has the patient been eating poorly because of a decreased appetite?: 0 ?Malnutrition Screening Tool Score: 0 ? ? ? ?Physical Exam ?Vitals and nursing note reviewed.  ?Cardiovascular:  ?   Rate and Rhythm: Normal rate.  ?Pulmonary:  ?   Effort: Pulmonary effort is normal.  ?Neurological:  ?   Mental Status: He is alert and oriented to person, place, and time.  ?Psychiatric:     ?   Mood and Affect: Mood normal.     ?   Behavior: Behavior normal.     ?   Thought Content: Thought content normal.  ? ?Review of Systems  ?Eyes: Negative.   ?Cardiovascular: Negative.   ?Musculoskeletal: Negative.   ?Psychiatric/Behavioral:  Positive for depression and hallucinations. Negative for suicidal ideas. The patient is nervous/anxious.   ?All other systems reviewed and are negative. ? ?Blood pressure (!) 162/101, pulse 90, resp. rate 20, SpO2 100 %. There is no height or weight on file to calculate BMI. ? ?Past Psychiatric History:   ? ?Is the patient at risk to self? Yes  ?Has the patient been a risk to self in the past 6 months? Yes .    ?Has the patient been a risk to self within the distant past? No   ?Is the patient a risk to others? No   ?Has the patient been a risk to others in the past 6 months? No   ?Has the patient been a risk to others within the distant past? No  ? ?Past Medical History:  ?Past Medical History:  ?Diagnosis Date  ? Fracture of metacarpal of right hand, closed 08/04/2010  ? Comminuted fx @ base  of [facility]  ? GERD (gastroesophageal reflux disease)   ? No past surgical history on file. ? ?Family History: No family history on file. ? ?Social History:  ?Social History  ? ?Socioeconomic History  ? Marital status: Single  ?  Spouse name: Not on  file  ? Number of children: Not on file  ? Years of education: Not on file  ? Highest education level: Not on file  ?Occupational History  ? Not on file  ?Tobacco Use  ? Smoking status: Every Day  ?  Packs/day: 1.00  ?  Types: Cigarettes, Cigars  ? Smokeless tobacco: Never  ?Vaping Use  ? Vaping Use: Never used  ?Substance and Sexual Activity  ? Alcohol use: Yes  ?  Comment: occ  ? Drug use: No  ? Sexual activity: Not on file  ?Other Topics Concern  ? Not on file  ?Social History Narrative  ? Not on file  ? ?Social Determinants of Health  ? ?Financial Resource Strain: Not on file  ?Food Insecurity: Not on file  ?Transportation Needs: Not on file  ?Physical Activity: Not on file  ?Stress: Not on file  ?Social Connections: Not on file  ?Intimate Partner Violence: Not on file  ? ? ?SDOH:  ?SDOH Screenings  ? ?Alcohol Screen: Not on file  ?Depression (PHQ2-9): Not on file  ?Financial Resource Strain: Not  on file  ?Food Insecurity: Not on file  ?Housing: Not on file  ?Physical Activity: Not on file  ?Social Connections: Not on file  ?Stress: Not on file  ?Tobacco Use: High Risk  ? Smoking Tobacco Use: Every Day  ? Smokeless Tobacco Use: Never  ? Passive Exposure: Not on file  ?Transportation Needs: Not on file  ? ? ?Last Labs:  ?No visits with results within 6 Month(s) from this visit.  ?Latest known visit with results is:  ?Admission on 08/22/2020, Discharged on 08/22/2020  ?Component Date Value Ref Range Status  ? Lipase 08/22/2020 25  11 - 51 U/L Final  ? Performed at [facility], [address] [place], [place] [address]  ? Sodium 08/22/2020 139  135 - 145 mmol/L Final  ? Potassium 08/22/2020 2.7 (LL)  3.5 - 5.1 mmol/L Final  ? Comment: CRITICAL RESULT CALLED TO, READ BACK BY AND VERIFIED WITH: ?Nat Math RN [id number] M GARRETT ?  ? Chloride 08/22/2020 103  98 - 111 mmol/L Final  ? CO2 08/22/2020 25  22 - 32 mmol/L Final  ? Glucose, Bld 08/22/2020 109 (H)  70 - 99 mg/dL Final  ? Glucose reference  range applies only to samples taken after fasting for at least 8 hours.  ? BUN 08/22/2020 <5 (L)  6 - 20 mg/dL Final  ? Creatinine, Ser 08/22/2020 0.80  0.61 - 1.24 mg/dL Final  ? Calcium 82/42/3536 9.7  8.9

## 2021-10-13 NOTE — ED Notes (Signed)
Patient arrived on unit. Patient calm and cooperative. Patient responding appropriately to staff aeb staff report. Patient safe on unit with continued monitoring. ?
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
## 2021-10-14 ENCOUNTER — Other Ambulatory Visit: Payer: Self-pay

## 2021-10-14 ENCOUNTER — Encounter (HOSPITAL_COMMUNITY): Payer: Self-pay | Admitting: Psychiatry

## 2021-10-14 ENCOUNTER — Inpatient Hospital Stay (HOSPITAL_COMMUNITY)
Admission: AD | Admit: 2021-10-14 | Discharge: 2021-10-19 | DRG: 897 | Disposition: A | Discharge status: Home / Self Care | Payer: Federal, State, Local not specified - Other | Source: Intra-hospital | Attending: Psychiatry | Admitting: Psychiatry

## 2021-10-14 DIAGNOSIS — Z20822 Contact with and (suspected) exposure to covid-19: Secondary | ICD-10-CM | POA: Diagnosis present

## 2021-10-14 DIAGNOSIS — F19959 Other psychoactive substance use, unspecified with psychoactive substance-induced psychotic disorder, unspecified: Secondary | ICD-10-CM | POA: Diagnosis not present

## 2021-10-14 DIAGNOSIS — R440 Auditory hallucinations: Secondary | ICD-10-CM | POA: Diagnosis present

## 2021-10-14 DIAGNOSIS — F17210 Nicotine dependence, cigarettes, uncomplicated: Secondary | ICD-10-CM | POA: Diagnosis present

## 2021-10-14 DIAGNOSIS — F15159 Other stimulant abuse with stimulant-induced psychotic disorder, unspecified: Principal | ICD-10-CM | POA: Diagnosis present

## 2021-10-14 DIAGNOSIS — F1590 Other stimulant use, unspecified, uncomplicated: Secondary | ICD-10-CM

## 2021-10-14 DIAGNOSIS — I10 Essential (primary) hypertension: Secondary | ICD-10-CM | POA: Diagnosis present

## 2021-10-14 DIAGNOSIS — G4700 Insomnia, unspecified: Secondary | ICD-10-CM | POA: Diagnosis present

## 2021-10-14 DIAGNOSIS — F29 Unspecified psychosis not due to a substance or known physiological condition: Secondary | ICD-10-CM | POA: Insufficient documentation

## 2021-10-14 DIAGNOSIS — F17200 Nicotine dependence, unspecified, uncomplicated: Secondary | ICD-10-CM | POA: Diagnosis present

## 2021-10-14 LAB — PROLACTIN: Prolactin: 1.8 ng/mL — ABNORMAL LOW (ref 4.0–15.2)

## 2021-10-14 MED ORDER — MAGNESIUM HYDROXIDE 400 MG/5ML PO SUSP: 30.0000 mL | Freq: Every day | ORAL | Status: DC | PRN

## 2021-10-14 MED ORDER — OLANZAPINE 5 MG PO TABS
5.0000 mg | ORAL_TABLET | Freq: Every day | ORAL | Status: DC
  Administered 2021-10-14 – 2021-10-18 (×5): 5 mg via ORAL
  Filled 2021-10-14 (×4): qty 1
  Filled 2021-10-14: qty 7
  Filled 2021-10-14 (×2): qty 1

## 2021-10-14 MED ORDER — ACETAMINOPHEN 325 MG PO TABS: 650.0000 mg | ORAL_TABLET | Freq: Four times a day (QID) | ORAL | Status: DC | PRN

## 2021-10-14 MED ORDER — HYDROXYZINE HCL 25 MG PO TABS
25.0000 mg | ORAL_TABLET | Freq: Three times a day (TID) | ORAL | Status: DC | PRN
  Filled 2021-10-14: qty 10

## 2021-10-14 MED ORDER — ALUM & MAG HYDROXIDE-SIMETH 200-200-20 MG/5ML PO SUSP: 30.0000 mL | ORAL | Status: DC | PRN

## 2021-10-14 MED ORDER — POTASSIUM CHLORIDE CRYS ER 20 MEQ PO TBCR
40.0000 meq | EXTENDED_RELEASE_TABLET | Freq: Every day | ORAL | Status: DC
  Administered 2021-10-15 – 2021-10-19 (×5): 40 meq via ORAL
  Filled 2021-10-14 (×6): qty 2

## 2021-10-14 MED ORDER — TRAZODONE HCL 50 MG PO TABS
50.0000 mg | ORAL_TABLET | Freq: Every evening | ORAL | Status: DC | PRN
  Administered 2021-10-14 – 2021-10-18 (×5): 50 mg via ORAL
  Filled 2021-10-14 (×4): qty 1
  Filled 2021-10-14: qty 7
  Filled 2021-10-14: qty 1

## 2021-10-14 MED ORDER — POTASSIUM CHLORIDE CRYS ER 20 MEQ PO TBCR
40.0000 meq | EXTENDED_RELEASE_TABLET | Freq: Every day | ORAL | Status: DC
Start: 2021-10-14 — End: 2021-10-14
  Administered 2021-10-14: 40 meq via ORAL
  Filled 2021-10-14: qty 2

## 2021-10-14 MED ORDER — OLANZAPINE 5 MG PO TABS: 5.0000 mg | ORAL_TABLET | Freq: Every day | ORAL | Status: DC

## 2021-10-14 NOTE — ED Notes (Signed)
Pt given socks his shoes were given to safe transport and all belongings from locker 17 given to safe transport.   No distress noted.   ?

## 2021-10-14 NOTE — ED Provider Notes (Signed)
This provider consulted with Dr. Alvino Chapel at [facility] for chart review and medication recommendations for K+ 2.8 on 10/13/21. Patient is asymptomatic. EKG QTC is 430. ? ?Dr. Alvino Chapel recommends potassium chloride 40 mEq po daily x 7 days.  ?

## 2021-10-14 NOTE — Progress Notes (Signed)
Admission Note: Patient is a 36 year old male admitted to the unit voluntarily from [facility] for symptoms of paranoia, hallucination and insomnia.  Patient is alert and oriented to person, place and time.  Patient appears guarded and only forward little information during assessment.  Admission plan of care reviewed, consent signed.  Skin assessment and personal belongings completed.  Skin is dry and intact.  No contraband found.  Patient oriented to the unit, room and staff.  Routine safety checks initiated.  Patient is safe on the unit. ?

## 2021-10-14 NOTE — ED Notes (Signed)
Pt sleeping at present, no distress noted, respirations even & unlabored.  Monitoring for safety. ?

## 2021-10-14 NOTE — ED Notes (Signed)
Pt spoke with the provider and went back to sleep.  Breathing even and unlabored.  Will continue to monitor for safety.  ?

## 2021-10-14 NOTE — BHH Group Notes (Signed)
Adult Psychoeducational Group Note ? ?Date:  10/14/2021 ?Time:  9:15 PM ? ?Group Topic/Focus:  ?Wrap-Up Group:   The focus of this group is to help patients review their daily goal of treatment and discuss progress on daily workbooks. ? ?Participation Level:  Did Not Attend ? ?Participation Quality:   DNA ? ?Affect:   DNA ? ?Cognitive:   DNA ? ?Insight: Appropriate ? ?Engagement in Group:   DNA ? ?Modes of Intervention:  Activity ? ?Additional Comments:  Pt did not attend group. ? ?Osa Craver ?10/14/2021, 9:15 PM ?

## 2021-10-14 NOTE — ED Notes (Signed)
Pt continues to sleep soundly.  Breathing even and unlabored.  ?

## 2021-10-14 NOTE — ED Notes (Signed)
Pt was given lunch and juice. 

## 2021-10-14 NOTE — Tx Team (Signed)
Initial Treatment Plan ?10/14/2021 ?3:56 PM ?[name] ?[id number] ? ? ? ?PATIENT STRESSORS: ?Financial difficulties   ?Marital or family conflict   ?Occupational concerns   ? ? ?PATIENT STRENGTHS: ?Capable of independent living  ?Physical Health  ?Supportive family/friends  ? ? ?PATIENT IDENTIFIED PROBLEMS: ?"Quit smoking"  ?"Job placement"  ?Paranoia  ?Depression  ?Drug Abuse  ?Ineffective coping skills  ?  ?  ?  ?  ? ?DISCHARGE CRITERIA:  ?Ability to meet basic life and health needs ?Adequate post-discharge living arrangements ? ?PRELIMINARY DISCHARGE PLAN: ?Attend aftercare/continuing care group ?Outpatient therapy ?Return to previous living arrangement ? ?PATIENT/FAMILY INVOLVEMENT: ?This treatment plan has been presented to and reviewed with the patient, Devin Davis, and/or family member.  The patient and family have been given the opportunity to ask questions and make suggestions. ? ?Vela Prose, RN ?10/14/2021, 3:56 PM ?

## 2021-10-14 NOTE — ED Provider Notes (Signed)
FBC/OBS ASAP Discharge Summary ? ?Date and Time: 10/14/2021 12:09 PM  ?Name: TORRON FEHRMAN  ?MRN:  [id number]  ? ?Discharge Diagnoses:  ?Final diagnoses:  ?Paranoid behavior ([place])  ? ? ?Subjective: Patient states, "I don't feel good." When asked to elaborate, he stares off and does not further elaborate. He denies physical symptoms. He endorses AH, "I'm still hearing voices." Again he is unable to further elaborate. He appears to answer questions with one or two word responses. He denies SI/HI/VH. He reports improved sleep last night.  ? ?Stay Summary: Cherokee Scriver is a 36 year old African-American male who presents to [facility] urgent care accompanied by his mother who reports worsening paranoia and bizarre behavior.  She reports patient has been under a lot of stress mainly related to his children's mother. " He has been paying child support however is not able to see his kids." Patient does report mild depression however states using Xanax to help him sleep at night. He denies any other illicit drug use. Reports auditory hallucinations " I can hear everything everybody says acute repeating in my head."  ? ?Patient was admitted to the [facility] continuous assessment for overnight observation. He was started on Zyprexa p.o. twice daily for psychosis. Zyprexa decreased to 5 mg po QHS to rule out thought blocking vs. daytime sedation. Labs obtained included CBC, CMP, A1c, lipid, prolactin, TSH, UDS, BAL, COVID, and EKG. Patient's UDS is negative. Patient potassium is 2.8. This provider consulted with Dr. Alvino Chapel at [facility] for chart review and medication recommendations for K+ 2.8 on 10/13/21. Dr. Alvino Chapel recommends potassium chloride 40 mEq po daily x 7 days. Today, patient appears to be exhibiting thought blocking and minimally participates during the assessment. Unable to perform a detailed assessment.  ? ?Total Time spent with patient: 15 minutes ? 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 ?Past Medical History:  ?Past Medical History:  ?Diagnosis Date  ? Fracture of metacarpal of right hand, closed 08/04/2010  ? Comminuted fx @ base  of [facility]  ? GERD (gastroesophageal reflux disease)   ? History reviewed. No pertinent surgical history. ?Family History: History reviewed. No pertinent family history. ?Family Psychiatric History:  ?Social History:  ?Social History  ? ?Substance and Sexual Activity  ?Alcohol Use Yes  ? Comment: occ  ?   ?Social History  ? ?Substance and Sexual Activity  ?Drug Use No  ?  ?Social History  ? ?Socioeconomic History  ? Marital status: Single  ?  Spouse name: Not on file  ? Number of children: Not on file  ? Years of education: Not on file  ? Highest education level: Not on file  ?Occupational History  ? Not on file  ?Tobacco Use  ? Smoking status: Every Day  ?  Packs/day: 1.00  ?  Types: Cigarettes, Cigars  ? Smokeless tobacco: Never  ?Vaping Use  ? Vaping Use: Never used  ?Substance and Sexual Activity  ? Alcohol use: Yes  ?  Comment: occ  ? Drug use: No  ? Sexual activity: Not on file  ?Other Topics Concern  ? Not on file  ?Social History Narrative  ? Not on file  ? ?Social Determinants of Health  ? ?Financial Resource Strain: Not on file  ?Food Insecurity: Not on file  ?Transportation Needs: Not on file  ?Physical Activity: Not on file  ?Stress: Not on file  ?Social Connections: Not on file  ? ?SDOH:  ?SDOH Screenings  ? ?Alcohol Screen: Not on  file  ?Depression (PHQ2-9): Medium Risk  ? PHQ-2 Score: 9  ?Financial Resource Strain: Not on file  ?Food Insecurity: Not on file  ?Housing: Not on file  ?Physical Activity: Not on file  ?Social Connections: Not on file  ?Stress: Not on file  ?Tobacco Use: High Risk  ? Smoking Tobacco Use: Every Day  ? Smokeless Tobacco Use: Never  ? Passive Exposure: Not on file  ?Transportation Needs: Not on file  ? ? ?Current Medications:  ?Current Facility-Administered Medications  ?Medication Dose Route Frequency Provider Last Rate  Last Admin  ? acetaminophen (TYLENOL) tablet 650 mg  650 mg Oral Q6H PRN Derrill Center, NP      ? alum & mag hydroxide-simeth (MAALOX/MYLANTA) 200-200-20 MG/5ML suspension 30 mL  30 mL Oral Q4H PRN Derrill Center, NP      ? hydrOXYzine (ATARAX) tablet 25 mg  25 mg Oral TID PRN Derrill Center, NP   25 mg at 10/13/21 2122  ? magnesium hydroxide (MILK OF MAGNESIA) suspension 30 mL  30 mL Oral Daily PRN Derrill Center, NP      ? OLANZapine (ZYPREXA) tablet 5 mg  5 mg Oral QHS Donaldo Teegarden L, NP      ? potassium chloride SA (KLOR-CON M) CR tablet 40 mEq  40 mEq Oral Daily Harvis Mabus L, NP   40 mEq at 10/14/21 1154  ? traZODone (DESYREL) tablet 50 mg  50 mg Oral QHS PRN Derrill Center, NP   50 mg at 10/13/21 2122  ? ?No current outpatient medications on file.  ? ? ?PTA Medications: (Not in a hospital admission) ? ? ?Musculoskeletal  ?Strength & Muscle Tone: within normal limits ?Gait & Station: normal ?Patient leans: N/A ? ?Psychiatric Specialty Exam  ?Presentation  ?General Appearance: Disheveled ? ?Eye Contact:Minimal ? ?Speech:Slow ? ?Speech Volume:Decreased ? ?Handedness:Right ? ? ?Mood and Affect  ?Mood:Dysphoric ? ?Affect:Congruent ? ? ?Thought Process  ?Thought Processes:Coherent ? ?Descriptions of Associations:Intact ? ?Orientation:Full (Time, Place and Person) ? ?Thought Content:Logical ? Diagnosis of Schizophrenia or Schizoaffective disorder in past: No ? Duration of Psychotic Symptoms: Less than six months ? ? Hallucinations:Hallucinations: Auditory ? ?Ideas of Reference:None ? ?Suicidal Thoughts:Suicidal Thoughts: No ? ?Homicidal Thoughts:Homicidal Thoughts: No ? ? ?Sensorium  ?Memory:Immediate Fair; Remote Fair; Recent Fair ? ?Judgment:Intact ? ?Insight:Present ? ? ?Executive Functions  ?Concentration:Poor ? ?Attention Span:Poor ? ?Recall:Poor ? ?Fund of Knowledge:Poor ? ?Language:Fair ? ? ?Psychomotor Activity  ?Psychomotor Activity:Psychomotor Activity: Decreased ? ? ?Assets  ?Assets:Housing;  Leisure Time; Desire for Improvement; Physical Health ? ? ?Sleep  ?Sleep:Sleep: Fair ? ? ?Nutritional Assessment (For OBS and FBC admissions only) ?Has the patient had a weight loss or gain of 10 pounds or more in the last 3 months?: No ?Has the patient had a decrease in food intake/or appetite?: No ?Does the patient have dental problems?: No ?Does the patient have eating habits or behaviors that may be indicators of an eating disorder including binging or inducing vomiting?: No ?Has the patient recently lost weight without trying?: 0 ?Has the patient been eating poorly because of a decreased appetite?: 0 ?Malnutrition Screening Tool Score: 0 ? ? ? ?Physical Exam  ?Physical Exam ?Constitutional:   ?   Appearance: Normal appearance.  ?HENT:  ?   Head: Normocephalic.  ?Eyes:  ?   Conjunctiva/sclera: Conjunctivae normal.  ?Cardiovascular:  ?   Rate and Rhythm: Normal rate.  ?Pulmonary:  ?   Effort: Pulmonary effort is normal.  ?Musculoskeletal:  ?  Cervical back: Normal range of motion.  ?Neurological:  ?   Mental Status: He is alert and oriented to person, place, and time.  ? ?Review of Systems  ?Constitutional: Negative.   ?HENT: Negative.    ?Eyes: Negative.   ?Respiratory: Negative.    ?Cardiovascular: Negative.   ?Gastrointestinal: Negative.   ?Genitourinary: Negative.   ?Musculoskeletal: Negative.   ?Skin: Negative.   ?Neurological: Negative.   ?Endo/Heme/Allergies: Negative.   ?Blood pressure (!) 142/95, pulse 71, temperature 98.5 ?F (36.9 ?C), temperature source Oral, resp. rate 18, SpO2 100 %. There is no height or weight on file to calculate BMI. ? ? ?Plan Of Care/Follow-up recommendations:  ?Activity:  as  tolerated ? OLANZapine  5 mg Oral QHS  ? potassium chloride  40 mEq Oral Daily  ? ? ?Disposition: Patient accepted to [facility], accepting provider Dr. Mamie Levers.  ? ? ?Marissa Calamity, NP ?10/14/2021, 12:09 PM ? ? ?

## 2021-10-15 ENCOUNTER — Encounter (HOSPITAL_COMMUNITY): Payer: Self-pay

## 2021-10-15 DIAGNOSIS — F19959 Other psychoactive substance use, unspecified with psychoactive substance-induced psychotic disorder, unspecified: Principal | ICD-10-CM | POA: Insufficient documentation

## 2021-10-15 DIAGNOSIS — F1590 Other stimulant use, unspecified, uncomplicated: Secondary | ICD-10-CM

## 2021-10-15 LAB — BASIC METABOLIC PANEL
Anion gap: 8 (ref 5–15)
BUN: 11 mg/dL (ref 6–20)
CO2: 25 mmol/L (ref 22–32)
Calcium: 9.2 mg/dL (ref 8.9–10.3)
Chloride: 108 mmol/L (ref 98–111)
Creatinine, Ser: 0.79 mg/dL (ref 0.61–1.24)
GFR, Estimated: >60 mL/min (ref >60)
Glucose, Bld: 170 mg/dL — ABNORMAL HIGH (ref 70–99)
Potassium: 3.5 mmol/L (ref 3.5–5.1)
Sodium: 141 mmol/L (ref 135–145)

## 2021-10-15 MED ORDER — CLONIDINE HCL 0.1 MG PO TABS
0.1000 mg | ORAL_TABLET | Freq: Two times a day (BID) | ORAL | Status: DC
  Administered 2021-10-15 – 2021-10-16 (×2): 0.1 mg via ORAL
  Filled 2021-10-15 (×6): qty 1

## 2021-10-15 MED ORDER — AMLODIPINE BESYLATE 5 MG PO TABS: 5.0000 mg | ORAL_TABLET | Freq: Every day | ORAL | Status: DC

## 2021-10-15 NOTE — Group Note (Signed)
LCSW Group Therapy Note ? ? ?Group Date: 10/15/2021 ?Start Time: 1300 ?End Time: 1400 ? ?Type of Therapy and Topic:  Group Therapy - Healthy vs Unhealthy Coping Skills ? ?Participation Level:  Active  ? ?Description of Group ?The focus of this group was to determine what unhealthy coping techniques typically are used by group members and what healthy coping techniques would be helpful in coping with various problems. Patients were guided in becoming aware of the differences between healthy and unhealthy coping techniques. Patients were asked to identify 2-3 healthy coping skills they would like to learn to use more effectively. ? ?Therapeutic Goals ?Patients learned that coping is what human beings do all day long to deal with various situations in their lives ?Patients defined and discussed healthy vs unhealthy coping techniques ?Patients identified their preferred coping techniques and identified whether these were healthy or unhealthy ?Patients determined 2-3 healthy coping skills they would like to become more familiar with and use more often. ?Patients provided support and ideas to each other ? ? ?Summary of Patient Progress:  During group, Devin Davis expressed that exercise and prayer are helpful coping skills. Patient proved open to input from peers and feedback from CSW. Patient demonstrated insight into the subject matter, was respectful of peers, and participated throughout the entire session. ? ?[name], LCSWA ?10/15/2021  1:42 PM   ? ?

## 2021-10-15 NOTE — Progress Notes (Signed)
?   10/15/21 1000  ?Psych Admission Type (Psych Patients Only)  ?Admission Status Voluntary  ?Psychosocial Assessment  ?Patient Complaints Suspiciousness  ?Eye Contact Avoids  ?Facial Expression Flat  ?Affect Depressed  ?Speech Logical/coherent  ?Interaction Cautious;Guarded  ?Motor Activity Slow  ?Appearance/Hygiene Unremarkable  ?Behavior Characteristics Guarded  ?Mood Suspicious  ?Thought Process  ?Coherency Blocking  ?Content Paranoia  ?Delusions Paranoid  ?Perception Hallucinations  ?Hallucination Auditory  ?Judgment Impaired  ?Confusion None  ?Danger to Self  ?Current suicidal ideation? Denies  ?Danger to Others  ?Danger to Others None reported or observed  ? ? ?

## 2021-10-15 NOTE — Progress Notes (Signed)
?   10/15/21 0400  ?Psych Admission Type (Psych Patients Only)  ?Admission Status Voluntary  ?Psychosocial Assessment  ?Patient Complaints Anxiety;Depression;Helplessness;Suspiciousness  ?Eye Contact Avoids  ?Facial Expression Flat  ?Affect Blunted;Depressed  ?Speech Logical/coherent  ?Interaction Cautious;Forwards little;Manipulative;Isolative  ?Motor Activity Slow  ?Appearance/Hygiene Unremarkable  ?Behavior Characteristics Guarded  ?Mood Apprehensive  ?Thought Process  ?Content Paranoia  ?Delusions Paranoid  ?Perception WDL  ?Hallucination None reported or observed  ?Judgment Impaired  ?Confusion None  ?Danger to Self  ?Current suicidal ideation? Denies  ?Agreement Not to Harm Self Yes  ?Description of Agreement verbal  ?Danger to Others  ?Danger to Others None reported or observed  ? ?Patient isolative to his room. Denies SI/HI/A/VH. Endorses anxiety and depression. Compliant with medications. Support and encouragement provided.  ?

## 2021-10-15 NOTE — Progress Notes (Signed)
Pt did not attend the evening AA group. 

## 2021-10-15 NOTE — BH IP Treatment Plan (Signed)
Interdisciplinary Treatment and Diagnostic Plan Update ? ?10/15/2021 ?Time of Session: 10:15am ?[name] ?MRN: [id number] ? ?Principal Diagnosis: Psychosis ([place]) ? ?Secondary Diagnoses: Principal Problem: ?  Psychosis ([place]) ? ? ?Current Medications:  ?Current Facility-Administered Medications  ?Medication Dose Route Frequency Provider Last Rate Last Admin  ? acetaminophen (TYLENOL) tablet 650 mg  650 mg Oral Q6H PRN White, Patrice L, NP      ? alum & mag hydroxide-simeth (MAALOX/MYLANTA) 200-200-20 MG/5ML suspension 30 mL  30 mL Oral Q4H PRN White, Patrice L, NP      ? hydrOXYzine (ATARAX) tablet 25 mg  25 mg Oral TID PRN White, Patrice L, NP      ? magnesium hydroxide (MILK OF MAGNESIA) suspension 30 mL  30 mL Oral Daily PRN White, Patrice L, NP      ? OLANZapine (ZYPREXA) tablet 5 mg  5 mg Oral QHS White, Patrice L, NP   5 mg at 10/14/21 2135  ? potassium chloride SA (KLOR-CON M) CR tablet 40 mEq  40 mEq Oral Daily White, Patrice L, NP   40 mEq at 10/15/21 0810  ? traZODone (DESYREL) tablet 50 mg  50 mg Oral QHS PRN White, Patrice L, NP   50 mg at 10/14/21 2136  ? ?PTA Medications: ?No medications prior to admission.  ? ? ?Patient Stressors: Financial difficulties   ?Marital or family conflict   ?Occupational concerns   ? ?Patient Strengths: Capable of independent living  ?Physical Health  ?Supportive family/friends  ? ?Treatment Modalities: Medication Management, Group therapy, Case management,  ?1 to 1 session with clinician, Psychoeducation, Recreational therapy. ? ? ?Physician Treatment Plan for Primary Diagnosis: Psychosis ([place]) ?Long Term Goal(s):    ? ?Short Term Goals:   ? ?Medication Management: Evaluate patient's response, side effects, and tolerance of medication regimen. ? ?Therapeutic Interventions: 1 to 1 sessions, Unit Group sessions and Medication administration. ? ?Evaluation of Outcomes: Not Met ? ?Physician Treatment Plan for Secondary Diagnosis: Principal Problem: ?  Psychosis  ([place]) ? ?Long Term Goal(s):    ? ?Short Term Goals:      ? ?Medication Management: Evaluate patient's response, side effects, and tolerance of medication regimen. ? ?Therapeutic Interventions: 1 to 1 sessions, Unit Group sessions and Medication administration. ? ?Evaluation of Outcomes: Not Met ? ? ?RN Treatment Plan for Primary Diagnosis: Psychosis ([place]) ?Long Term Goal(s): Knowledge of disease and therapeutic regimen to maintain health will improve ? ?Short Term Goals: Ability to remain free from injury will improve, Ability to verbalize frustration and anger appropriately will improve, Ability to demonstrate self-control, Ability to participate in decision making will improve, Ability to verbalize feelings will improve, Ability to disclose and discuss suicidal ideas, Ability to identify and develop effective coping behaviors will improve, and Compliance with prescribed medications will improve ? ?Medication Management: RN will administer medications as ordered by provider, will assess and evaluate patient's response and provide education to patient for prescribed medication. RN will report any adverse and/or side effects to prescribing provider. ? ?Therapeutic Interventions: 1 on 1 counseling sessions, Psychoeducation, Medication administration, Evaluate responses to treatment, Monitor vital signs and CBGs as ordered, Perform/monitor CIWA, COWS, AIMS and Fall Risk screenings as ordered, Perform wound care treatments as ordered. ? ?Evaluation of Outcomes: Not Met ? ? ?LCSW Treatment Plan for Primary Diagnosis: Psychosis ([place]) ?Long Term Goal(s): Safe transition to appropriate next level of care at discharge, Engage patient in therapeutic group addressing interpersonal concerns. ? ?Short Term Goals: Engage patient in aftercare planning with  referrals and resources, Increase social support, Increase ability to appropriately verbalize feelings, Increase emotional regulation, Facilitate acceptance of mental health  diagnosis and concerns, Facilitate patient progression through stages of change regarding substance use diagnoses and concerns, Identify triggers associated with mental health/substance abuse issues, and Increase skills for wellness and recovery ? ?Therapeutic Interventions: Assess for all discharge needs, 1 to 1 time with Education officer, museum, Explore available resources and support systems, Assess for adequacy in community support network, Educate family and significant other(s) on suicide prevention, Complete Psychosocial Assessment, Interpersonal group therapy. ? ?Evaluation of Outcomes: Not Met ? ? ?Progress in Treatment: ?Attending groups: No. ?Participating in groups: No. ?Taking medication as prescribed: Yes. ?Toleration medication: Yes. ?Family/Significant other contact made: No, will contact:  CSW will assess supports to speak with ?Patient understands diagnosis: No. ?Discussing patient identified problems/goals with staff: Yes. CSW had to provide choices of a treatment goal for patient to pick what he wanted to work on.  ?Medical problems stabilized or resolved: Yes. ?Denies suicidal/homicidal ideation: Yes. ?Issues/concerns per patient self-inventory: No. ?Other: none reported ? ?New problem(s) identified: No, Describe:  none reported ? ?New Short Term/Long Term Goal(s) ? ?medication stabilization, elimination of SI thoughts, development of comprehensive mental wellness plan.  ? ? ?Patient Goals:  Patient states that he would like to stabilize on meds.  ? ?Discharge Plan or Barriers: Patient recently admitted. CSW will continue to follow and assess for appropriate referrals and possible discharge planning.  ? ? ?Reason for Continuation of Hospitalization: Delusions  ?Depression ?Hallucinations ?Medication stabilization ?Other; describe patient has been acting paranoid and bizarre ? ?Estimated Length of Stay: ? ?Last 3 [place] Suicide Severity Risk Score: ?[place] Admission (Current) from 10/14/2021 in  [facility] ED from 10/13/2021 in [facility] ED from 03/02/2021 in [place] DEPT  ?C-SSRS RISK CATEGORY No Risk No Risk No Risk  ? ?  ? ? ?Last PHQ 2/9 Scores: ? ?  10/13/2021  ?  2:26 PM 08/22/2020  ?  9:50 AM  ?Depression screen PHQ 2/9  ?Decreased Interest 1 3  ?Down, Depressed, Hopeless 1 2  ?PHQ - 2 Score 2 5  ?Altered sleeping 2 2  ?Tired, decreased energy 1 2  ?Change in appetite 0 0  ?Feeling bad or failure about yourself  1 2  ?Trouble concentrating 1 2  ?Moving slowly or fidgety/restless 1 1  ?Suicidal thoughts 1 1  ?PHQ-9 Score 9 15  ?Difficult doing work/chores Very difficult Very difficult  ? ? ?Scribe for Treatment Team: ?Zachery Conch, LCSW ?10/15/2021 ?1:33 PM ? ? ?

## 2021-10-15 NOTE — Group Note (Signed)
Recreation Therapy Group Note ? ? ?Group Topic:Team Building  ?Group Date: 10/15/2021 ?Start Time: 0930 ?End Time: 5809 ?Facilitators: Caroll Rancher, LRT,CTRS ?Location: [address] Dayroom ? ? ?Goal Area(s) Addresses:  ?Patient will effectively work with peer towards shared goal.  ?Patient will identify skills used to make activity successful.  ?Patient will identify how skills used during activity can be applied to reach post d/c goals.  ? ?Group Description: Tallest [employer]. In teams of 5-6, patients were given 25 small craft pipe cleaners. Using the materials provided, patients were instructed to compete again the opposing team(s) to build the tallest free-standing structure from floor level. The activity was timed; difficulty increased by Clinical research associate as Production designer, theatre/television/film continued.  Systematically resources were removed with additional directions for example, placing one arm behind their back, working in silence, and shape stipulations. LRT facilitated post-activity discussion reviewing team processes and necessary communication skills involved in completion. Patients were encouraged to reflect how the skills utilized, or not utilized, in this activity can be incorporated to positively impact support systems post discharge. ? ? ?Affect/Mood: N/A ?  ?Participation Level: Did not attend ?  ? ?Clinical Observations/Individualized Feedback:   ? ? ?Plan: Continue to engage patient in RT group sessions 2-3x/week. ? ? ?Caroll Rancher, LRT,CTRS ?10/15/2021 12:24 PM ?

## 2021-10-15 NOTE — [facility] Suicide Risk Assessment (Signed)
[facility] Admission Suicide Risk Assessment ? ? ?Nursing information obtained from:  Patient ?Demographic factors:  Male, Low socioeconomic status, Unemployed ?Current Mental Status:  NA ?Loss Factors:  Decrease in vocational status, Financial problems / change in socioeconomic status ?Historical Factors:  NA ?Risk Reduction Factors:  Living with another person, especially a relative ? ?Total Time spent with patient: 45 minutes ?Principal Problem: Substance-induced psychotic disorder (HCC) ?Diagnosis:  Active Problems: ?  TOBACCO USE ?  Psychosis (HCC) ?  Stimulant use disorder ? ?Subjective Data:  ?Patient is a 36 year old male who denies having any previous formal psychiatric diagnosis, who was admitted to the psychiatric unit for evaluation and treatment of paranoia, hallucinations, insomnia, and bizarre behavior.  Patient was admitted from the [facility] ?  ?Outpatient psychiatric medication regimen: None ?  ?On my evaluation today, the patient reports "I was trippin'.  I was drinking energy drinks.  I was up for 2 to 3 days.  I was hearing things and feeling paranoid that people were out to get me."  Per [facility] H&P, the patient has been under a lot of stress related to his child's mother, he was paying child support but not able to see his kids.  He reported having mild depression to that provider and using Xanax to help him sleep at night."  Patient reports that he was awake for 3 to 4 days, from Saturday to Tuesday.  He reports that during this time he was having auditory hallucinations, that have since resolved.  He reports last having auditory hallucinations "a few days ago".  He reports that during this time he was also feeling paranoid "someone was after me.  Trying to harm me".  He reports these thoughts resolved a few days ago as well.  He denies VH.  He reports some symptoms of thought control during that time as well, which have resolved. ?He reports that leading up to this admission feeling down depressed and  sad, reports pervasive sadness for over 2 weeks, but denies anhedonia.  He reports that at this time "I am good.  I do not feel depressed or sad". ?Patient reports that sleep was recently poor, as above.  He reports that sleep was better last night with medication.  He reports appetite "is better".  Patient demonstrates poor concentration. ?When asked about suicidal thoughts, the patient reports "not really but kind of I should not have them I do not think I have any now".  Denies having homicidal thoughts. ?Patient reports anxiety is up and down but denies this is excessive, chronic, or generalized.  Denies having panic attacks.  Denies having history of trauma. ?Patient reports having history of increased energy with decreased need for sleep, lasting 3 to 4 days, with inability to concentrate during this time.  He denies that his mood is either elevated, grandiose, or irritable during this time.  Denies racing thoughts or pressured speech during this time.  Patient reports these episodes are preceded by drinking lots of energy drinks. ? ? ? ? ?Continued Clinical Symptoms:  ?  ?The "Alcohol Use Disorders Identification Test", Guidelines for Use in Primary Care, Second Edition.  [employer] writer [facility]). ?Score between 0-7:  no or low risk or alcohol related problems. ?Score between 8-15:  moderate risk of alcohol related problems. ?Score between 16-19:  high risk of alcohol related problems. ?Score 20 or above:  warrants further diagnostic evaluation for alcohol dependence and treatment. ? ? ?CLINICAL FACTORS:  ? Alcohol/Substance Abuse/Dependencies - caffeine type ?Psychotic  symptoms - reported to have subsided  ? ?Musculoskeletal: ?Strength & Muscle Tone: within normal limits ?Gait & Station: normal ?Patient leans: N/A ? ?Psychiatric Specialty Exam: ? ?Presentation  ?General Appearance: Appropriate for Environment; Casual; Fairly Groomed ? ?Eye Contact:Good ? ?Speech:Normal Rate ? ?Speech  Volume:Normal ? ?Handedness:Right ? ? ?Mood and Affect  ?Mood:Euthymic ? ?Affect:Full Range; Congruent ? ? ?Thought Process  ?Thought Processes:Goal Directed ? ?Descriptions of Associations:Intact ? ?Orientation:Full (Time, Place and Person) ? ?Thought Content:Logical ? ?History of Schizophrenia/Schizoaffective disorder:No ? ?Duration of Psychotic Symptoms:Less than six months ? ?Hallucinations:Hallucinations: None ? ?Ideas of Reference:None ? ?Suicidal Thoughts:Suicidal Thoughts: No ? ?Homicidal Thoughts:Homicidal Thoughts: No ? ? ?Sensorium  ?Memory:Immediate Fair; Recent Fair; Remote Fair ? ?Judgment:Fair ? ?Insight:Fair ? ? ?Executive Functions  ?Concentration:Fair ? ?Attention Span:Fair ? ?Recall:Poor ? ?Fund of Knowledge:Poor ? ?Language:Fair ? ? ?Psychomotor Activity  ?Psychomotor Activity:Psychomotor Activity: Normal ? ? ?Assets  ?Assets:Housing; Leisure Time; Desire for Improvement; Physical Health ? ? ?Sleep  ?Sleep:Sleep: Fair ? ? ? ?Physical Exam: ?Physical Exam See H&P ? ?ROS See H&P ? ?Blood pressure (!) 135/96, pulse 83, temperature 98.4 ?F (36.9 ?C), temperature source Oral, resp. rate 18, height 5\' 9"  (1.753 m), weight 74.4 kg, SpO2 100 %. Body mass index is 24.22 kg/m?. ? ? ?COGNITIVE FEATURES THAT CONTRIBUTE TO RISK:  ?Some scattered thinking, mild   ? ?SUICIDE RISK:  ? Mild:  There are no identifiable suicide plans, no associated intent, mild dysphoria and related symptoms, good self-control (both objective and subjective assessment), few other risk factors, and identifiable protective factors, including available and accessible social support. ? ?PLAN OF CARE:  ? ?See the H&P for additional information regarding assessment, diagnosis list, and plan.  ? ? ?I certify that inpatient services furnished can reasonably be expected to improve the patient's condition.  ? ? , MD ?10/15/2021, 3:16 PM ? ?

## 2021-10-15 NOTE — H&P (Addendum)
Psychiatric Admission Assessment Adult ? ?Patient Identification: Devin Davis ?MRN:  [id number] ?Date of Evaluation:  10/15/2021 ?Chief Complaint:  Psychosis (HCC) [F29] ?Principal Diagnosis: Substance-induced psychotic disorder (HCC) ?Diagnosis:  Active Problems: ?  TOBACCO USE ?  Psychosis (HCC) ?  Stimulant use disorder ? ?History of Present Illness:  ?Patient is a 36 year old male who denies having any previous formal psychiatric diagnosis, who was admitted to the psychiatric unit for evaluation and treatment of paranoia, hallucinations, insomnia, and bizarre behavior.  Patient was admitted from the [facility] ? ?Outpatient psychiatric medication regimen: None ? ?On my evaluation today, the patient reports "I was trippin'.  I was drinking energy drinks.  I was up for 2 to 3 days.  I was hearing things and feeling paranoid that people were out to get me."  Per [facility] H&P, the patient has been under a lot of stress related to his child's mother, he was paying child support but not able to see his kids.  He reported having mild depression to that provider and using Xanax to help him sleep at night."  Patient reports that he was awake for 3 to 4 days, from Saturday to Tuesday.  He reports that during this time he was having auditory hallucinations, that have since resolved.  He reports last having auditory hallucinations "a few days ago".  He reports that during this time he was also feeling paranoid "someone was after me.  Trying to harm me".  He reports these thoughts resolved a few days ago as well.  He denies VH.  He reports some symptoms of thought control during that time as well, which have resolved. ?He reports that leading up to this admission feeling down depressed and sad, reports pervasive sadness for over 2 weeks, but denies anhedonia.  He reports that at this time "I am good.  I do not feel depressed or sad". ?Patient reports that sleep was recently poor, as above.  He reports that sleep was better  last night with medication.  He reports appetite "is better".  Patient demonstrates poor concentration. ?When asked about suicidal thoughts, the patient reports "not really but kind of I should not have them I do not think I have any now".  Denies having homicidal thoughts. ?Patient reports anxiety is up and down but denies this is excessive, chronic, or generalized.  Denies having panic attacks.  Denies having history of trauma. ?Patient reports having history of increased energy with decreased need for sleep, lasting 3 to 4 days, with inability to concentrate during this time.  He denies that his mood is either elevated, grandiose, or irritable during this time.  Denies racing thoughts or pressured speech during this time.  Patient reports these episodes are preceded by drinking lots of energy drinks. ? ?Per chart review, patient was evaluated in the emergency department in February 2022, for paranoid thinking, and was attributed to use of ecstasy. ? ?Past psychiatric history: ?Diagnosis: Denies ?Denies previous psychiatric hospitalization ?Denies history of suicide attempt ?Denies current psychiatric medication prescription ?Denies psychiatric medication history ? ?Past medical history: Patient reports he told me my blood pressure was high but I never been diagnosed with high blood pressure.  Patient reports he has been told he has low potassium before.  Denies any further work-up or outpatient potassium supplementation. ?Patient reports "having seizures when I was little" and is not sure if he was ever prescribed antiseizure medication ?Denies history of surgeries ?NKDA ? ?Family history: Patient reports grandmother was diagnosed with bipolar  disorder.  Denies family history of suicide attempts. ? ?Substance use history: Denies alcohol use.  Reports babying nicotine and smoking cigarettes.  Denies marijuana use.  Denies other illicit drug use. ? ? ? ?Total Time spent with patient: 45 minutes ? ? ? ?Is the  patient at risk to self?  Patient response unclear, see above ?Has the patient been a risk to self in the past 6 months? No.  ?Has the patient been a risk to self within the distant past? No.  ?Is the patient a risk to others? No.  ?Has the patient been a risk to others in the past 6 months? No.  ?Has the patient been a risk to others within the distant past? No.  ? ?Prior Inpatient Therapy:   ?Prior Outpatient Therapy:   ? ?Alcohol Screening: 1. How often do you have a drink containing alcohol?: Never ?2. How many drinks containing alcohol do you have on a typical day when you are drinking?: 1 or 2 ?3. How often do you have six or more drinks on one occasion?: Never ?AUDIT-C Score: 0 ?Substance Abuse History in the last 12 months:  Yes.   ?Consequences of Substance Abuse: ?Medical Consequences:  psychiatric ?Previous Psychotropic Medications: No  ?Psychological Evaluations: Yes  ?Past Medical History:  ?Past Medical History:  ?Diagnosis Date  ? Fracture of metacarpal of right hand, closed 08/04/2010  ? Comminuted fx @ base  of R 4[facility]  ? GERD (gastroesophageal reflux disease)   ? History reviewed. No pertinent surgical history. ?Family History: History reviewed. No pertinent family history. ? ?Tobacco Screening: Yes ?Social History:  ?Social History  ? ?Substance and Sexual Activity  ?Alcohol Use Yes  ? Comment: occ  ?   ?Social History  ? ?Substance and Sexual Activity  ?Drug Use No  ?  ?Additional Social History: ?  ?   ?  ?  ?  ?  ?  ?  ?  ?  ?  ?  ? ?Allergies:  No Known Allergies ?Lab Results:  ?Results for orders placed or performed during the hospital encounter of 10/13/21 (from the past 48 hour(s))  ?Resp Panel by RT-PCR (Flu A&B, Covid) Nasopharyngeal Swab     Status: None  ? Collection Time: 10/13/21  3:35 PM  ? Specimen: Nasopharyngeal Swab; Nasopharyngeal(NP) swabs in vial transport medium  ?Result Value Ref Range  ? SARS Coronavirus 2 by RT PCR NEGATIVE NEGATIVE  ?  Comment: (NOTE) ?SARS-CoV-2 target  nucleic acids are NOT DETECTED. ? ?The SARS-CoV-2 RNA is generally detectable in upper respiratory ?specimens during the acute phase of infection. The lowest ?concentration of SARS-CoV-2 viral copies this assay can detect is ?138 copies/mL. A negative result does not preclude SARS-Cov-2 ?infection and should not be used as the sole basis for treatment or ?other patient management decisions. A negative result may occur with  ?improper specimen collection/handling, submission of specimen other ?than nasopharyngeal swab, presence of viral mutation(s) within the ?areas targeted by this assay, and inadequate number of viral ?copies(<138 copies/mL). A negative result must be combined with ?clinical observations, patient history, and epidemiological ?information. The expected result is Negative. ? ?Fact Sheet for Patients:  ?BloggerCourse.com ? ?Fact Sheet for Healthcare Providers:  ?SeriousBroker.it ? ?This test is no t yet approved or cleared by the [place] FDA and  ?has been authorized for detection and/or diagnosis of SARS-CoV-2 by ?FDA under an Emergency Use Authorization (EUA). This EUA will remain  ?in effect (meaning this test can be  used) for the duration of the ?COVID-19 declaration under Section 564(b)(1) of the Act, 21 ?U.S.C.section 360bbb-3(b)(1), unless the authorization is terminated  ?or revoked sooner.  ? ? ?  ? Influenza A by PCR NEGATIVE NEGATIVE  ? Influenza B by PCR NEGATIVE NEGATIVE  ?  Comment: (NOTE) ?The Xpert Xpress SARS-CoV-2/FLU/RSV plus assay is intended as an aid ?in the diagnosis of influenza from Nasopharyngeal swab specimens and ?should not be used as a sole basis for treatment. Nasal washings and ?aspirates are unacceptable for Xpert Xpress SARS-CoV-2/FLU/RSV ?testing. ? ?Fact Sheet for Patients: ?BloggerCourse.comhttps://www.fda.gov/media/152166/download ? ?Fact Sheet for Healthcare Providers: ?SeriousBroker.ithttps://www.fda.gov/media/152162/download ? ?This test  is not yet approved or cleared by the [place] FDA and ?has been authorized for detection and/or diagnosis of SARS-CoV-2 by ?FDA under an Emergency Use Authorization (EUA). This EUA will remain ?in effect

## 2021-10-16 DIAGNOSIS — F19959 Other psychoactive substance use, unspecified with psychoactive substance-induced psychotic disorder, unspecified: Secondary | ICD-10-CM

## 2021-10-16 MED ORDER — METOPROLOL SUCCINATE ER 50 MG PO TB24
50.0000 mg | ORAL_TABLET | Freq: Every day | ORAL | Status: DC
  Filled 2021-10-16 (×2): qty 1

## 2021-10-16 MED ORDER — CLONIDINE HCL 0.1 MG PO TABS: 0.1000 mg | ORAL_TABLET | Freq: Two times a day (BID) | ORAL | Status: DC | PRN

## 2021-10-16 MED ORDER — CLONIDINE HCL 0.1 MG PO TABS: 0.1000 mg | ORAL_TABLET | Freq: Two times a day (BID) | ORAL | Status: DC

## 2021-10-16 MED ORDER — METOPROLOL SUCCINATE ER 50 MG PO TB24
50.0000 mg | ORAL_TABLET | Freq: Every day | ORAL | Status: DC
  Administered 2021-10-17 – 2021-10-19 (×3): 50 mg via ORAL
  Filled 2021-10-16: qty 1
  Filled 2021-10-16: qty 7
  Filled 2021-10-16 (×3): qty 1

## 2021-10-16 NOTE — [facility] Group Notes (Signed)
.  Psychoeducational Group Note ? ? ? ?Date:  4/15//23 ?Time: 1100-1200 ? ? ? ?Purpose of Group: . The group focus' on teaching patients on how to identify their needs and their Life Skills:  A group where two lists are made. What people need and what are things that we do that are unhealthy. The lists are developed by the patients and it is explained that we often do the actions that are not healthy to get our list of needs met. ? ?Goal:: to develop the coping skills needed to get their needs met ? ?Participation Level:  did not attend ?Devin Davis A ? ?

## 2021-10-16 NOTE — [facility] Group Notes (Signed)
Patient did not attend the goals group. 

## 2021-10-16 NOTE — Progress Notes (Signed)
?   10/16/21 0300  ?Psych Admission Type (Psych Patients Only)  ?Admission Status Voluntary  ?Psychosocial Assessment  ?Patient Complaints Anxiety;Depression;Isolation;Sleep disturbance;Suspiciousness  ?Eye Contact Avoids  ?Facial Expression Flat  ?Affect Blunted;Depressed  ?Speech Logical/coherent  ?Interaction Cautious;Forwards little;Manipulative;Isolative  ?Motor Activity Slow  ?Appearance/Hygiene Unremarkable  ?Behavior Characteristics Unwilling to participate  ?Mood Depressed;Suspicious  ?Thought Process  ?Coherency Tangential  ?Content Paranoia  ?Delusions Paranoid  ?Perception WDL  ?Hallucination None reported or observed  ?Judgment Impaired  ?Confusion None  ?Danger to Self  ?Current suicidal ideation?  ?(DENIES)  ?Agreement Not to Harm Self Yes  ?Description of Agreement verbal  ?Danger to Others  ?Danger to Others None reported or observed  ? ?Patient compliant with medications no adverse drug noted continues to be isolative to his room with minimal interactions, Support and encouragement provided. ?

## 2021-10-16 NOTE — Progress Notes (Signed)
Pt is A&OX4, calm, flat, cooperative, denies suicidal ideations, denies homicidal ideations, denies auditory hallucinations and denies visual hallucinations. Pt verbally agrees to approach staff if these become apparent and before harming self or others. Pt denies experiencing nightmares. Mood and affect are congruent. Pt appetite is ok. No complaints of anxiety, distress, pain and/or discomfort at this time. Pt's memory appears to be grossly intact, and Pt hasn't displayed any injurious behaviors. Pt is medication compliant. There's no evidence of suicidal intent. Psychomotor activity was WNL. No s/s of Parkinson, Dystonia, Akathisia and/or Tardive Dyskinesia noted.   

## 2021-10-16 NOTE — Progress Notes (Signed)
?   10/16/21 2228  ?Psych Admission Type (Psych Patients Only)  ?Admission Status Voluntary  ?Psychosocial Assessment  ?Patient Complaints Depression;Suspiciousness  ?Eye Contact Brief  ?Facial Expression Flat  ?Affect Depressed  ?Speech Logical/coherent  ?Interaction Guarded;Minimal  ?Motor Activity Other (Comment) ?(WDL)  ?Appearance/Hygiene Unremarkable  ?Behavior Characteristics Guarded  ?Mood Depressed;Preoccupied  ?Thought Process  ?Coherency WDL  ?Content Paranoia  ?Delusions Paranoid  ?Perception WDL  ?Hallucination None reported or observed  ?Judgment Impaired  ?Confusion None  ?Danger to Self  ?Current suicidal ideation? Denies  ?Agreement Not to Harm Self Yes  ?Description of Agreement verbal  ?Danger to Others  ?Danger to Others None reported or observed  ? ? ?

## 2021-10-16 NOTE — Progress Notes (Addendum)
[facility] MD Progress Note ? ?10/16/2021 2:37 PM ?Devin Davis  ?MRN:  [id number] ? ?Subjective:  "I am good today because I do not hear voices talking in my ears anymore." ? ?Brief History: Patient is a 36 year old male who denies having any previous formal psychiatric diagnosis, who was admitted to the psychiatric unit for evaluation and treatment of paranoia, hallucinations, insomnia, and bizarre behavior.  Patient was admitted from the [facility]. ? ?Daily Notes 10/16/21: On examination to day, patient was met in his room attempting to make his bed. Was corporative when asked to sit down for the assessment. Chart is reviewed, and findings shared with treatment team and discussed with Dr. Leverne Humbles. Patient is alert and oriented to time, place person and situation. Speech is clear and fluent with normal pattern and volume. Mood is appropriate but affect is depressed. Thought process is coherent and linear. Thought content is within normal limit and logical. Actively participating in therapeutic milieu and group activities. BP elevated at 141/99. Ordered metoprolol XL 50 mg po daily for better control of BP/ low K+ management and made catapres 0.1 mg po BID PRN. Continue to monitor for safety, stabilization and response to medication therapy. Glucose 170, however, HgBA1C is 5.6 normal. ? ?Patient reported feeling well and denies anxiety today. Rated depression as "5" out of 10. Denied suicidal ideation, homicidal ideation, auditory or visual hallucination. Endorsed sleeping for up to 6 hours last night. No acute discomfort noted today and denied any adverse effects to prescribed medications. ?  ? ?Principal Problem: Substance-induced psychotic disorder ([place]) ? ?Diagnosis: Principal Problem: ?  Substance-induced psychotic disorder ([place]) ?Active Problems: ?  TOBACCO USE ?  Stimulant use disorder ? ?Total Time spent with patient: 30 minutes ? ?Past Psychiatric History: Denies previous psychiatric hospitalization ?Denies history  of suicide attempt ?Denies current psychiatric medication prescription ?Denies psychiatric medication history  ? ?Past Medical History:  ?Past Medical History:  ?Diagnosis Date  ? Fracture of metacarpal of right hand, closed 08/04/2010  ? Comminuted fx @ base  of [facility]  ? GERD (gastroesophageal reflux disease)   ? History reviewed. No pertinent surgical history. ? ?Family History: History reviewed. No pertinent family history. ? ?Family Psychiatric  History:  Grandmother dx with Bipolar ? ?Social History:  ?Social History  ? ?Substance and Sexual Activity  ?Alcohol Use Yes  ? Comment: occ  ?   ?Social History  ? ?Substance and Sexual Activity  ?Drug Use No  ?  ?Social History  ? ?Socioeconomic History  ? Marital status: Single  ?  Spouse name: Not on file  ? Number of children: Not on file  ? Years of education: Not on file  ? Highest education level: Not on file  ?Occupational History  ? Not on file  ?Tobacco Use  ? Smoking status: Every Day  ?  Packs/day: 1.00  ?  Types: Cigarettes, Cigars  ? Smokeless tobacco: Never  ?Vaping Use  ? Vaping Use: Never used  ?Substance and Sexual Activity  ? Alcohol use: Yes  ?  Comment: occ  ? Drug use: No  ? Sexual activity: Not Currently  ?Other Topics Concern  ? Not on file  ?Social History Narrative  ? Not on file  ? ?Social Determinants of Health  ? ?Financial Resource Strain: Not on file  ?Food Insecurity: Not on file  ?Transportation Needs: Not on file  ?Physical Activity: Not on file  ?Stress: Not on file  ?Social Connections: Not on file  ? ?  Additional Social History:  ?  ?Sleep: Good ? ?Appetite:  Good ? ?Current Medications: ?Current Facility-Administered Medications  ?Medication Dose Route Frequency Provider Last Rate Last Admin  ? acetaminophen (TYLENOL) tablet 650 mg  650 mg Oral Q6H PRN White, Patrice L, NP      ? alum & mag hydroxide-simeth (MAALOX/MYLANTA) 200-200-20 MG/5ML suspension 30 mL  30 mL Oral Q4H PRN White, Patrice L, NP      ? cloNIDine (CATAPRES)  tablet 0.1 mg  0.1 mg Oral BID Massengill, Ovid Curd, MD   0.1 mg at 10/16/21 3532  ? hydrOXYzine (ATARAX) tablet 25 mg  25 mg Oral TID PRN White, Patrice L, NP      ? magnesium hydroxide (MILK OF MAGNESIA) suspension 30 mL  30 mL Oral Daily PRN White, Patrice L, NP      ? OLANZapine (ZYPREXA) tablet 5 mg  5 mg Oral QHS White, Patrice L, NP   5 mg at 10/15/21 2131  ? potassium chloride SA (KLOR-CON M) CR tablet 40 mEq  40 mEq Oral Daily White, Patrice L, NP   40 mEq at 10/16/21 9924  ? traZODone (DESYREL) tablet 50 mg  50 mg Oral QHS PRN White, Patrice L, NP   50 mg at 10/15/21 2131  ? ?Lab Results:  ?Results for orders placed or performed during the hospital encounter of 10/14/21 (from the past 48 hour(s))  ?Basic metabolic panel     Status: Abnormal  ? Collection Time: 10/15/21  6:25 PM  ?Result Value Ref Range  ? Sodium 141 135 - 145 mmol/L  ? Potassium 3.5 3.5 - 5.1 mmol/L  ? Chloride 108 98 - 111 mmol/L  ? CO2 25 22 - 32 mmol/L  ? Glucose, Bld 170 (H) 70 - 99 mg/dL  ?  Comment: Glucose reference range applies only to samples taken after fasting for at least 8 hours.  ? BUN 11 6 - 20 mg/dL  ? Creatinine, Ser 0.79 0.61 - 1.24 mg/dL  ? Calcium 9.2 8.9 - 10.3 mg/dL  ? GFR, Estimated >60 >60 mL/min  ?  Comment: (NOTE) ?Calculated using the CKD-EPI Creatinine Equation (2021) ?  ? Anion gap 8 5 - 15  ?  Comment: Performed at [facility], [place] [address], [place], [place] [address]  ? ?Blood Alcohol level:  ?No results found for: [facility] ? ?Metabolic Disorder Labs: ?Lab Results  ?Component Value Date  ? HGBA1C 5.6 10/13/2021  ? MPG 114.02 10/13/2021  ? ?Lab Results  ?Component Value Date  ? PROLACTIN 1.8 (L) 10/13/2021  ? ?Lab Results  ?Component Value Date  ? CHOL 131 10/13/2021  ? TRIG 38 10/13/2021  ? HDL 60 10/13/2021  ? CHOLHDL 2.2 10/13/2021  ? VLDL 8 10/13/2021  ? Harris 63 10/13/2021  ? ?Physical Findings: ?AIMS: Facial and Oral Movements ?Muscles of Facial Expression: None, normal ?Lips and  Perioral Area: None, normal ?Jaw: None, normal ?Tongue: None, normal,Extremity Movements ?Upper (arms, wrists, hands, fingers): None, normal ?Lower (legs, knees, ankles, toes): None, normal, Trunk Movements ?Neck, shoulders, hips: None, normal, Overall Severity ?Severity of abnormal movements (highest score from questions above): None, normal ?Incapacitation due to abnormal movements: None, normal ?Patient's awareness of abnormal movements (rate only patient's report): No Awareness, Dental Status ?Current problems with teeth and/or dentures?: No ?Does patient usually wear dentures?: No  ?CIWA:    ?COWS:    ? ?Musculoskeletal: ?Strength & Muscle Tone: within normal limits ?Gait & Station: normal ?Patient leans: N/A ? ?Psychiatric Specialty  Exam: ? ?Presentation  ?General Appearance: Casual; Fairly Groomed; Appropriate for Environment ? ?Eye Contact:Fair ? ?Speech:Clear and Coherent ? ?Speech Volume:Normal ? ?Handedness:Right ? ?Mood and Affect  ?Mood:Depressed ? ?Affect:Depressed; Appropriate ? ?Thought Process  ?Thought Processes:Coherent; Linear ? ?Descriptions of Associations:Intact ? ?Orientation:Full (Time, Place and Person) ? ?Thought Content:Logical; WDL ? ?History of Schizophrenia/Schizoaffective disorder:No ? ?Duration of Psychotic Symptoms:N/A ? ?Hallucinations:Hallucinations: None (Denied today) ? ?Ideas of Reference:None ? ?Suicidal Thoughts:Suicidal Thoughts: No ? ?Homicidal Thoughts:Homicidal Thoughts: No ? ? ?Sensorium  ?Memory:Immediate Fair; Recent Fair; Remote Fair ? ?Judgment:Fair ? ?Insight:Fair ? ?Executive Functions  ?Concentration:Fair ? ?Attention Span:Fair ? ?Recall:Fair ? ?[place] ? ?Language:Good ? ?Psychomotor Activity  ?Psychomotor Activity:Psychomotor Activity: Normal ? ?Assets  ?Assets:Communication Skills; Physical Health ? ?Sleep  ?Sleep:Sleep: Good ?Number of Hours of Sleep: 6 ? ?Physical Exam: ?Physical Exam ?Vitals and nursing note reviewed.  ?Constitutional:   ?    Appearance: Normal appearance.  ?HENT:  ?   Head: Normocephalic and atraumatic.  ?   Right Ear: External ear normal.  ?   Left Ear: External ear normal.  ?   Nose: Nose normal.  ?   Mouth/Throat:  ?   Mouth: M

## 2021-10-16 NOTE — Group Note (Signed)
LCSW Group Therapy Note ? ?Group Date: 10/16/2021 ?Start Time: 1000 ?End Time: 1100 ? ? ?Type of Therapy and Topic:  Group Therapy: Anger Cues and Responses ? ?Participation Level:  Minimal ? ? ?Description of Group:   ?In this group, patients learned how to recognize the physical, cognitive, emotional, and behavioral responses they have to anger-provoking situations.  They identified a recent time they became angry and how they reacted.  They analyzed how their reaction was possibly beneficial and how it was possibly unhelpful.  The group discussed a variety of healthier coping skills that could help with such a situation in the future.  Focus was placed on how helpful it is to recognize the underlying emotions to our anger, because working on those can lead to a more permanent solution as well as our ability to focus on the important rather than the urgent. ? ?Therapeutic Goals: ?Patients will remember their last incident of anger and how they felt emotionally and physically, what their thoughts were at the time, and how they behaved. ?Patients will identify how their behavior at that time worked for them, as well as how it worked against them. ?Patients will explore possible new behaviors to use in future anger situations. ?Patients will learn that anger itself is normal and cannot be eliminated, and that healthier reactions can assist with resolving conflict rather than worsening situations. ? ?Summary of Patient Progress:  Stacy was active during the group. He shared that when he is angry he tends to do nothing about it because he does not want to get in trouble.  He spoke very little and demonstrated little insight into the subject matter, was respectful of peers, and participated throughout the entire session. ? ?Therapeutic Modalities:   ?Cognitive Behavioral Therapy ? ? ? ?Lynnell Chad, LCSWA ?10/16/2021  2:47 PM   ? ?

## 2021-10-16 NOTE — Progress Notes (Signed)
Adult Psychoeducational Group Note ? ?Date:  10/16/2021 ?Time:  9:30 PM ? ?Group Topic/Focus:  ?Wrap-Up Group:   The focus of this group is to help patients review their daily goal of treatment and discuss progress on daily workbooks. ? ?Participation Level:  Active ? ?Participation Quality:  Appropriate ? ?Affect:  Appropriate ? ?Cognitive:  Appropriate ? ?Insight: Appropriate ? ?Engagement in Group:  Engaged ? ?Modes of Intervention:  Discussion ? ?Additional Comments:  Pt stated his goal for today was to focus on his treatment plan. Pt stated he accomplished his goal today. Pt stated he did not talked with his doctor or with his social worker about his care today. Pt rated his overall day a 10. Pt stated he made no calls today. Pt stated he felt better about himself today. Pt stated he was able to attend all meals. Pt stated he took all medications provided today. Pt stated his appetite was pretty good today. Pt rated sleep last night was pretty good. Pt stated the goal tonight was to get some rest. Pt stated he had no physical pain tonight. Pt deny visual hallucinations and auditory issues tonight. Pt denies thoughts of harming himself or others. Pt stated he would alert staff if anything changed ? ?Felipa Furnace ?10/16/2021, 9:30 PM ?

## 2021-10-16 NOTE — [facility] Group Notes (Signed)
Psychoeducational Group Note ? ?Date: 10/16/2021 ?Time: 0900-1000 ? ? ? ?Goal Setting  ? ?Purpose of Group: This group helps to provide patients with the steps of setting a goal that is specific, measurable, attainable, realistic and time specific. A discussion on how we keep ourselves stuck with negative self talk. Homework given for Patients to write 30 positive attributes about themselves. ? ? ? ?Participation Level:  did not attend ?Honour Schwieger A ?

## 2021-10-17 DIAGNOSIS — I10 Essential (primary) hypertension: Secondary | ICD-10-CM | POA: Diagnosis present

## 2021-10-17 DIAGNOSIS — G5620 Lesion of ulnar nerve, unspecified upper limb: Secondary | ICD-10-CM | POA: Insufficient documentation

## 2021-10-17 DIAGNOSIS — S60212A Contusion of left wrist, initial encounter: Secondary | ICD-10-CM | POA: Insufficient documentation

## 2021-10-17 NOTE — Progress Notes (Signed)

## 2021-10-17 NOTE — B[facility] Group Notes (Signed)
Adult Psychoeducational Group  ?Date:  10/05/14/2023 ?Time:  9826-4158 ? ?Group Topic/Focus: Continuation of the group from Saturday. Looking at the lists that were created and talking about what needs to be done with the homework of 30 positives about themselves.  ?                                   Talking about taking their power back and helping themselves to develop a positive self esteem. ?     ?Participation Quality:  Appropriate ? ?Affect:  Appropriate ? ?Cognitive:  Oriented ? ?Insight: Improving ? ?Engagement in Group:  Engaged ? ?Modes of Intervention:  Activity, Discussion, Education, and Support ? ?Additional Comments:  Rates his energy at a 10/10. Participated in the group. ? ?Vira Blanco A ? ? ?

## 2021-10-17 NOTE — BHH Counselor (Signed)
Adult Comprehensive Assessment ? ?Patient ID: Devin Davis, male   DOB: 12/09/1985, 36 y.o.   MRN: [id number] ? ?Information Source: ?Information source: Patient ? ?Current Stressors:  ?Patient states their primary concerns and needs for treatment are:: "I just need to get home and get back to work." ?Patient states their goals for this hospitilization and ongoing recovery are:: "No I have pretty much been helped already." ?Educational / Learning stressors: None ?Employment / Job issues: None ?Family Relationships: None ?Financial / Lack of resources (include bankruptcy): "Yes sometimes, nothing in particular." ?Housing / Lack of housing: None ?Physical health (include injuries & life threatening diseases): None ?Social relationships: None ?Substance abuse: None ?Bereavement / Loss: None ? ?Living/Environment/Situation:  ?Living Arrangements: Other relatives ?Living conditions (as described by patient or guardian): Good ?Who else lives in the home?: Grandmother ?How long has patient lived in current situation?: A couple of months ?What is atmosphere in current home: Comfortable ? ?Family History:  ?Marital status: Single ?Does patient have children?: Yes (4 children) ?How is patient's relationship with their children?: "I have a good relationship with my kids." ? ?Childhood History:  ?By whom was/is the patient raised?: Mother ?Description of patient's relationship with caregiver when they were a child: Reports being close. ?Patient's description of current relationship with people who raised him/her: Good ?How were you disciplined when you got in trouble as a child/adolescent?: Would be grounded. ?Does patient have siblings?: Yes ?Number of Siblings: 2 ?Description of patient's current relationship with siblings: Good relationship ?Did patient suffer any verbal/emotional/physical/sexual abuse as a child?: No ?Did patient suffer from severe childhood neglect?: No ?Has patient ever been sexually  abused/assaulted/raped as an adolescent or adult?: No ?Was the patient ever a victim of a crime or a disaster?: No ?Witnessed domestic violence?: No ?Has patient been affected by domestic violence as an adult?: No ? ?Education:  ?Highest grade of school patient has completed: Some college ?Currently a student?: No ?Learning disability?: No ? 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 ?Financial Resources:   ?Financial resources: Income from employment ?Does patient have a representative payee or guardian?: No ? ?Alcohol/Substance Abuse:   ?What has been your use of drugs/alcohol within the last 12 months?: "A little alcohol but thats it." ?If yes, describe treatment: Yes, through probation. ?Has alcohol/substance abuse ever caused legal problems?: No ? ?Social Support System:   ?Patient's Community Support System: Good ?Describe Community Support System: His mother ?Type of faith/religion: Yes, Geanna Divirgilio ? ?Leisure/Recreation:   ?Do You Have Hobbies?: Yes ?Leisure and Hobbies: He likes to play sports ? ?Strengths/Needs:   ?What is the patient's perception of their strengths?: "I am smart, and active." ?Patient states these barriers may affect/interfere with their treatment: N/A ?Patient states these barriers may affect their return to the community: N/A ? ?Discharge Plan:   ?Currently receiving community mental health services: No (Reports never having recieved mental health  resources.) ?Patient states concerns and preferences for aftercare planning are: Phychiatrist for  medication management, and general mental health support group. ?Patient states they will know when they are safe and ready for discharge when: Feels he is ready to go home now. ?Does patient have access to transportation?: Yes (Mom to pick up) ?Does patient have financial barriers related to discharge medications?: Yes ?Patient description of barriers related to discharge medications: Has no active coverage. ?Will patient be returning to same living situation after discharge?: Yes (Return to live with grandmother.) ? ?Summary/Recommendations:   ?Summary and Recommendations (to be completed by the evaluator): Patient is a 36 year old male who presents to [facility] with reports of hearing voices and not getting enough sleep. Pt currently lives with grandmother, and reports his mother as his biggest support system. Pt has not recieved any mental health treatment in the past, but is open to medication management and support groups after discharge. Though patient's chart indications pt has previously reported "tripping out" on pills when upset, pt now reports no substance use in the past year and no life stressors; this is inconsistent with admission information. Patient would benefit from group therapy, medication management, psychoeducation, family session, discharge planning. At discharge it is recommended that they adhere to the established aftercare plan. ? ?Aldine Contes LCSWA 10/17/2021 ?

## 2021-10-17 NOTE — Progress Notes (Signed)
?  Pt presented brighter tonight, remained in dayroom until closing and was observed interacting appropriately with peers on the unit.   ? ? 10/17/21 2208  ?Psych Admission Type (Psych Patients Only)  ?Admission Status Voluntary  ?Psychosocial Assessment  ?Patient Complaints Depression  ?Eye Contact Fair  ?Facial Expression Flat  ?Affect Appropriate to circumstance  ?Speech Logical/coherent  ?Interaction Minimal  ?Motor Activity Other (Comment) ?(WDL)  ?Appearance/Hygiene Unremarkable  ?Behavior Characteristics Cooperative  ?Mood Depressed  ?Thought Process  ?Coherency WDL  ?Content WDL  ?Delusions None reported or observed  ?Perception WDL  ?Hallucination None reported or observed  ?Judgment Impaired  ?Confusion None  ?Danger to Self  ?Current suicidal ideation? Denies  ?Agreement Not to Harm Self Yes  ?Description of Agreement verbal  ?Danger to Others  ?Danger to Others None reported or observed  ? ? ?

## 2021-10-17 NOTE — Progress Notes (Signed)
[facility] MD Progress Note ? ?10/17/2021 2:08 PM ?Devin Davis  ?MRN:  [id number] ? ?Subjective:  "I feel good and calm because I have been resting and do not hear voices talking in my ears anymore." ? ?Brief History: Patient is a 36 year old male who denies having any previous formal psychiatric diagnosis, who was admitted to the psychiatric unit for evaluation and treatment of paranoia, hallucinations, insomnia, and bizarre behavior.  Patient was admitted from the [facility]. ? ?Daily Notes 10/17/21: On examination today, patient was met in his room sitting on the floor with his back to the wall stretching and exercising. Was corporative when asked to relax for the assessment. Chart is reviewed, and findings shared with treatment team and discussed with Dr. Leverne Humbles. Patient is alert and oriented to time, place person and situation. Speech is clear and fluent with normal pattern and volume. Mood/Affect is appropriate and euthymic. Thought process is coherent and linear. Thought content is within normal limit and logical. Actively participating in therapeutic milieu and group activities although did not attend morning orientation/goal setting group activity.  BP elevated at 132/98, pulse 101. Continued on metoprolol XL 50 mg po daily for better control of BP/ low K+ management. Last serum potassium level 3.5 on 10/15/21. Continues on catapres 0.1 mg po BID PRN. Continue to monitor for safety, stabilization and response to medication therapy. Glucose 170, however, HgBA1C is 5.6 normal. ? ?Patient reported feeling well and denies anxiety today. Rated depression as "5" out of 10. Denied suicidal ideation, homicidal ideation, auditory or visual hallucination. Endorsed sleeping for up to 6 hours last night. No acute discomfort noted today and denied any adverse effects to prescribed medications.  Patient stated he feels well and can be safe at home. ? ? Principal Problem: Substance-induced psychotic disorder ([place]) ? ?Diagnosis:  Principal Problem: ?  Substance-induced psychotic disorder ([place]) ?Active Problems: ?  TOBACCO USE ?  Stimulant use disorder ?  Hypertension ? ?Total Time spent with patient: 30 minutes ? ?Past Psychiatric History: Denies previous psychiatric hospitalization ?Denies history of suicide attempt ?Denies current psychiatric medication prescription ?Denies psychiatric medication history  ? ?Past Medical History:  ?Past Medical History:  ?Diagnosis Date  ? Fracture of metacarpal of right hand, closed 08/04/2010  ? Comminuted fx @ base  of R 4[facility]  ? GERD (gastroesophageal reflux disease)   ? History reviewed. No pertinent surgical history. ? ?Family History: History reviewed. No pertinent family history. ? ?Family Psychiatric  History:  Grandmother dx with Bipolar ? ?Social History:  ?Social History  ? ?Substance and Sexual Activity  ?Alcohol Use Yes  ? Comment: occ  ?   ?Social History  ? ?Substance and Sexual Activity  ?Drug Use No  ?  ?Social History  ? ?Socioeconomic History  ? Marital status: Single  ?  Spouse name: Not on file  ? Number of children: Not on file  ? Years of education: Not on file  ? Highest education level: Not on file  ?Occupational History  ? Not on file  ?Tobacco Use  ? Smoking status: Every Day  ?  Packs/day: 1.00  ?  Types: Cigarettes, Cigars  ? Smokeless tobacco: Never  ?Vaping Use  ? Vaping Use: Never used  ?Substance and Sexual Activity  ? Alcohol use: Yes  ?  Comment: occ  ? Drug use: No  ? Sexual activity: Not Currently  ?Other Topics Concern  ? Not on file  ?Social History Narrative  ? Not on file  ? ?  Social Determinants of Health  ? ?Financial Resource Strain: Not on file  ?Food Insecurity: Not on file  ?Transportation Needs: Not on file  ?Physical Activity: Not on file  ?Stress: Not on file  ?Social Connections: Not on file  ? ?Additional Social History:  ?  ?Sleep: Good ? ?Appetite:  Good ? ?Current Medications: ?Current Facility-Administered Medications  ?Medication Dose Route  Frequency Provider Last Rate Last Admin  ? acetaminophen (TYLENOL) tablet 650 mg  650 mg Oral Q6H PRN White, Patrice L, NP      ? alum & mag hydroxide-simeth (MAALOX/MYLANTA) 200-200-20 MG/5ML suspension 30 mL  30 mL Oral Q4H PRN White, Patrice L, NP      ? cloNIDine (CATAPRES) tablet 0.1 mg  0.1 mg Oral BID PRN Eathen Budreau, Kris Hartmann, FNP      ? hydrOXYzine (ATARAX) tablet 25 mg  25 mg Oral TID PRN White, Patrice L, NP      ? magnesium hydroxide (MILK OF MAGNESIA) suspension 30 mL  30 mL Oral Daily PRN White, Patrice L, NP      ? metoprolol succinate (TOPROL-XL) 24 hr tablet 50 mg  50 mg Oral Daily Alize Acy C, FNP   50 mg at 10/17/21 0738  ? OLANZapine (ZYPREXA) tablet 5 mg  5 mg Oral QHS White, Patrice L, NP   5 mg at 10/16/21 2136  ? potassium chloride SA (KLOR-CON M) CR tablet 40 mEq  40 mEq Oral Daily White, Patrice L, NP   40 mEq at 10/17/21 0738  ? traZODone (DESYREL) tablet 50 mg  50 mg Oral QHS PRN White, Patrice L, NP   50 mg at 10/16/21 2136  ? ?Lab Results:  ?Results for orders placed or performed during the hospital encounter of 10/14/21 (from the past 48 hour(s))  ?Basic metabolic panel     Status: Abnormal  ? Collection Time: 10/15/21  6:25 PM  ?Result Value Ref Range  ? Sodium 141 135 - 145 mmol/L  ? Potassium 3.5 3.5 - 5.1 mmol/L  ? Chloride 108 98 - 111 mmol/L  ? CO2 25 22 - 32 mmol/L  ? Glucose, Bld 170 (H) 70 - 99 mg/dL  ?  Comment: Glucose reference range applies only to samples taken after fasting for at least 8 hours.  ? BUN 11 6 - 20 mg/dL  ? Creatinine, Ser 0.79 0.61 - 1.24 mg/dL  ? Calcium 9.2 8.9 - 10.3 mg/dL  ? GFR, Estimated >60 >60 mL/min  ?  Comment: (NOTE) ?Calculated using the CKD-EPI Creatinine Equation (2021) ?  ? Anion gap 8 5 - 15  ?  Comment: Performed at [facility], [place] [address], [place],  [address]  ? ?Blood Alcohol level:  ?No results found for: [facility] ? ?Metabolic Disorder Labs: ?Lab Results  ?Component Value Date  ? HGBA1C 5.6 10/13/2021  ? MPG 114.02  10/13/2021  ? ?Lab Results  ?Component Value Date  ? PROLACTIN 1.8 (L) 10/13/2021  ? ?Lab Results  ?Component Value Date  ? CHOL 131 10/13/2021  ? TRIG 38 10/13/2021  ? HDL 60 10/13/2021  ? CHOLHDL 2.2 10/13/2021  ? VLDL 8 10/13/2021  ? [place] 63 10/13/2021  ? ?Physical Findings: ?AIMS: Facial and Oral Movements ?Muscles of Facial Expression: None, normal ?Lips and Perioral Area: None, normal ?Jaw: None, normal ?Tongue: None, normal,Extremity Movements ?Upper (arms, wrists, hands, fingers): None, normal ?Lower (legs, knees, ankles, toes): None, normal, Trunk Movements ?Neck, shoulders, hips: None, normal, Overall Severity ?Severity of abnormal movements (  highest score from questions above): None, normal ?Incapacitation due to abnormal movements: None, normal ?Patient's awareness of abnormal movements (rate only patient's report): No Awareness, Dental Status ?Current problems with teeth and/or dentures?: No ?Does patient usually wear dentures?: No  ?CIWA:    ?COWS:    ? ?Musculoskeletal: ?Strength & Muscle Tone: within normal limits ?Gait & Station: normal ?Patient leans: N/A ? ?Psychiatric Specialty Exam: ? ?Presentation  ?General Appearance: Appropriate for Environment; Casual; Fairly Groomed ? ?Eye Contact:Good ? ?Speech:Clear and Coherent; Normal Rate ? ?Speech Volume:Normal ? ?Handedness:Right ? ?Mood and Affect  ?Mood:Euthymic ? ?Affect:Appropriate ? ?Thought Process  ?Thought Processes:Coherent; Linear ? ?Descriptions of Associations:Intact ? ?Orientation:Full (Time, Place and Person) ? ?Thought Content:Logical; WDL ? ?History of Schizophrenia/Schizoaffective disorder:Yes ? ?Duration of Psychotic Symptoms:Less than six months (Substance Induced psychosis) ? ?Hallucinations:Hallucinations: None ? ?Ideas of Reference:None ? ?Suicidal Thoughts:Suicidal Thoughts: No ? ?Homicidal Thoughts:Homicidal Thoughts: No ? ?Sensorium  ?Memory:Immediate Fair; Recent Fair; Remote  Fair ? ?Judgment:Fair ? ?Insight:Fair ? ?Executive Functions  ?Concentration:Good ? ?Attention Span:Good ? ?Recall:Fair ? ?[place] ? ?Language:Good ? ?Psychomotor Activity  ?Psychomotor Activity:Psychomotor Activity: Normal ? ?Assets  ?Assets

## 2021-10-17 NOTE — Group Note (Signed)
[facility] LCSW Group Therapy Note ? ?10/17/2021   10:00-11:00AM ? ?Type of Therapy and Topic:  Group Therapy:  Unhealthy versus Healthy Supports, Which Am I? ? ?Participation Level:  Minimal  ? ?Description of Group:  Patients in this group were introduced to the concept that additional supports including self-support are an essential part of recovery.  Initially a discussion was held about the differences between healthy versus unhealthy supports.  Patients were asked to share what unhealthy supports in their lives need to be addressed, as well as what additional healthy supports could be added for greater help in reaching their goals.   A song entitled "My Own Hero" was played and a group discussion ensued in which patients stated they could relate to the song and it inspired them to realize they have be willing to help themselves in order to succeed, because other people cannot achieve sobriety or stability for them.  We discussed adding a variety of healthy supports to address the various needs in patient lives, including becoming more self-supportive.  A song was played called "I Know Where I've Been" toward the end of group and used to conduct an inspirational wrap-up to group of remembering how far they have already come in their journey. ? ?Therapeutic Goals: ?1)  Highlight the differences between healthy and unhealthy supports ?2)  Suggest the importance of being a part of one's own support system ?2)  Discuss reasons people in one's life may eventually be unable to be continually supportive ? 3)  Identify the patient's current support system  ? 4) Elicit commitments to add healthy supports and to become more conscious of being self-supportive ?  ?Summary of Patient Progress:  The patient listed as healthy supports the following:  prayer, his faith, his family, exercising.  The patient expressed that unhealthy supports to be addressed include drinking (the [facility] store).  The patient listened but did not talk  further. ? ?Therapeutic Modalities:   ?Motivational Interviewing ?Activity ? ?Lynnell Chad , MSW, LCSW  ?

## 2021-10-17 NOTE — [facility] Group Notes (Signed)
Patient did not attend morning orientation/goal setting group, although she was made aware of it.  

## 2021-10-17 NOTE — Progress Notes (Signed)
Adult Psychoeducational Group Note ? ?Date:  10/17/2021 ?Time:  10:31 PM ? ?Group Topic/Focus:  ?Wrap-Up Group:   The focus of this group is to help patients review their daily goal of treatment and discuss progress on daily workbooks. ? ?Participation Level:  Active ? ?Participation Quality:  Appropriate and Attentive ? ?Affect:  Appropriate ? ?Cognitive:  Appropriate ? ?Insight: Appropriate ? ?Engagement in Group:  Engaged ? ?Modes of Intervention:  Discussion ? ?Additional Comments:   ?Pt was engaged during group discussion. Pt didn't express much detail during group conversation but stated that he had a " good day" and identified one of his goals to be continued focus on treatment plan. ? ?Gerhard Perches ?10/17/2021, 10:31 PM ?

## 2021-10-17 NOTE — [facility] Group Notes (Signed)
Adult Psychoeducational Group Not ?Date:  10/17/2021 ?Time:  5784-6962 ?Group Topic/Focus: PROGRESSIVE RELAXATION. A group where deep breathing is taught and tensing and relaxation muscle groups is used. Imagery is used as well.  Pts are asked to imagine 3 pillars that hold them up when they are not able to hold themselves up and to share that with the group. ? ?Participation Level:  Active ? ?Participation Quality:  Appropriate ? ?Affect:  Appropriate ? ?Cognitive:  Oriented ? ?Insight: Improving ? ?Engagement in Group:  Engaged ? ?Modes of Intervention:  Activity, Discussion, Education, and Support ? ?Additional Comments:  States, God, his family and himself holds him up. ? ?Devin Davis A ? ? ?

## 2021-10-18 ENCOUNTER — Encounter (HOSPITAL_COMMUNITY): Payer: Self-pay

## 2021-10-18 NOTE — [facility] Suicide Risk Assessment (Signed)
[facility] INPATIENT:  Family/Significant Other Suicide Prevention Education ? ?Suicide Prevention Education:  ?Education Completed; [name], [phone]   (name of family member/significant other) has been identified by the patient as the family member/significant other with whom the patient will be residing, and identified as the person(s) who will aid the patient in the event of a mental health crisis (suicidal ideations/suicide attempt).  With written consent from the patient, the family member/significant other has been provided the following suicide prevention education, prior to the and/or following the discharge of the patient. ? ?CSW called mother. Per mother, patient has had several stressors including a break up and the mother of his child not letting him see his kids.  Mother reports that she believes he took some type of substance to deal with everything and he started to have a mental breakdown.  No access to guns or weapons in the house and no additional safety concerns.  Patient can return to stay with her upon discharge.   ? ?The suicide prevention education provided includes the following: ?Suicide risk factors ?Suicide prevention and interventions ?National Suicide Hotline telephone number ?[facility] assessment telephone number ?[facility] Emergency Assistance 911 ?[place] and/or Residential Mobile Crisis Unit telephone number ? ?Request made of family/significant other to: ?Remove weapons (e.g., guns, rifles, knives), all items previously/currently identified as safety concern.   ?Remove drugs/medications (over-the-counter, prescriptions, illicit drugs), all items previously/currently identified as a safety concern. ? ?The family member/significant other verbalizes understanding of the suicide prevention education information provided.  The family member/significant other agrees to remove the items of safety concern listed above. ? ?Kasidee Voisin E Towana Stenglein ?10/18/2021, 2:20 PM ?

## 2021-10-18 NOTE — [facility] Group Notes (Signed)
Patient attended the AA meeting

## 2021-10-18 NOTE — Progress Notes (Signed)
Devin Davis reported his day overall was 6/10 and he attributes this to being out of the unit and attending groups.  He denied SI/HI or AVH.  He took his hs medications without difficulty and requested trazodone for sleep.  Q 15 minute checks maintained for safety. ? ? 10/18/21 2157  ?Psych Admission Type (Psych Patients Only)  ?Admission Status Voluntary  ?Psychosocial Assessment  ?Patient Complaints Depression  ?Eye Contact Fair  ?Facial Expression Sad  ?Affect Appropriate to circumstance  ?Speech Logical/coherent  ?Interaction Minimal  ?Motor Activity Other (Comment) ?(unremarkable)  ?Appearance/Hygiene Unremarkable  ?Behavior Characteristics Cooperative;Appropriate to situation  ?Mood Depressed  ?Thought Process  ?Coherency WDL  ?Content WDL  ?Delusions None reported or observed  ?Perception WDL  ?Hallucination None reported or observed  ?Judgment WDL  ?Confusion None  ?Danger to Self  ?Current suicidal ideation? Denies  ?Danger to Others  ?Danger to Others None reported or observed  ? ? ?

## 2021-10-18 NOTE — Plan of Care (Signed)
Nurse discussed anxiety and coping skills with patient. 

## 2021-10-18 NOTE — [facility] IP Treatment Plan (Signed)
Interdisciplinary Treatment and Diagnostic Plan Update ? ?10/18/2021 ?Time of Session: 10:15am  ?Devin Davis ?MRN: [id number] ? ?Principal Diagnosis: Substance-induced psychotic disorder ([facility]) ? ?Secondary Diagnoses: Principal Problem: ?  Substance-induced psychotic disorder ([facility]) ?Active Problems: ?  TOBACCO USE ?  Stimulant use disorder ?  Hypertension ? ? ?Current Medications:  ?Current Facility-Administered Medications  ?Medication Dose Route Frequency Provider Last Rate Last Admin  ? acetaminophen (TYLENOL) tablet 650 mg  650 mg Oral Q6H PRN White, Patrice L, NP      ? alum & mag hydroxide-simeth (MAALOX/MYLANTA) 200-200-20 MG/5ML suspension 30 mL  30 mL Oral Q4H PRN White, Patrice L, NP      ? cloNIDine (CATAPRES) tablet 0.1 mg  0.1 mg Oral BID PRN Ntuen, Jesusita Oka, FNP      ? hydrOXYzine (ATARAX) tablet 25 mg  25 mg Oral TID PRN White, Patrice L, NP      ? magnesium hydroxide (MILK OF MAGNESIA) suspension 30 mL  30 mL Oral Daily PRN White, Patrice L, NP      ? metoprolol succinate (TOPROL-XL) 24 hr tablet 50 mg  50 mg Oral Daily Ntuen, Tina C, FNP   50 mg at 10/18/21 5400  ? OLANZapine (ZYPREXA) tablet 5 mg  5 mg Oral QHS White, Patrice L, NP   5 mg at 10/17/21 2136  ? potassium chloride SA (KLOR-CON M) CR tablet 40 mEq  40 mEq Oral Daily White, Patrice L, NP   40 mEq at 10/18/21 8676  ? traZODone (DESYREL) tablet 50 mg  50 mg Oral QHS PRN White, Patrice L, NP   50 mg at 10/17/21 2136  ? ?PTA Medications: ?No medications prior to admission.  ? ? ?Patient Stressors: Financial difficulties   ?Marital or family conflict   ?Occupational concerns   ? ?Patient Strengths: Capable of independent living  ?Physical Health  ?Supportive family/friends  ? ?Treatment Modalities: Medication Management, Group therapy, Case management,  ?1 to 1 session with clinician, Psychoeducation, Recreational therapy. ? ? ?Physician Treatment Plan for Primary Diagnosis: Substance-induced psychotic disorder (HCC) ?Long Term Goal(s):  Improvement in symptoms so as ready for discharge  ? ?Short Term Goals: Ability to identify changes in lifestyle to reduce recurrence of condition will improve ?Ability to verbalize feelings will improve ?Ability to disclose and discuss suicidal ideas ?Ability to demonstrate self-control will improve ?Ability to identify and develop effective coping behaviors will improve ?Ability to maintain clinical measurements within normal limits will improve ?Compliance with prescribed medications will improve ?Ability to identify triggers associated with substance abuse/mental health issues will improve ? ?Medication Management: Evaluate patient's response, side effects, and tolerance of medication regimen. ? ?Therapeutic Interventions: 1 to 1 sessions, Unit Group sessions and Medication administration. ? ?Evaluation of Outcomes: Progressing ? ?Physician Treatment Plan for Secondary Diagnosis: Principal Problem: ?  Substance-induced psychotic disorder (HCC) ?Active Problems: ?  TOBACCO USE ?  Stimulant use disorder ?  Hypertension ? ?Long Term Goal(s): Improvement in symptoms so as ready for discharge  ? ?Short Term Goals: Ability to identify changes in lifestyle to reduce recurrence of condition will improve ?Ability to verbalize feelings will improve ?Ability to disclose and discuss suicidal ideas ?Ability to demonstrate self-control will improve ?Ability to identify and develop effective coping behaviors will improve ?Ability to maintain clinical measurements within normal limits will improve ?Compliance with prescribed medications will improve ?Ability to identify triggers associated with substance abuse/mental health issues will improve    ? ?Medication Management: Evaluate patient's response, side effects, and  tolerance of medication regimen. ? ?Therapeutic Interventions: 1 to 1 sessions, Unit Group sessions and Medication administration. ? ?Evaluation of Outcomes: Progressing ? ? ?RN Treatment Plan for Primary  Diagnosis: Substance-induced psychotic disorder (HCC) ?Long Term Goal(s): Knowledge of disease and therapeutic regimen to maintain health will improve ? ?Short Term Goals: Ability to remain free from injury will improve, Ability to participate in decision making will improve, Ability to verbalize feelings will improve, Ability to disclose and discuss suicidal ideas, and Ability to identify and develop effective coping behaviors will improve ? ?Medication Management: RN will administer medications as ordered by provider, will assess and evaluate patient's response and provide education to patient for prescribed medication. RN will report any adverse and/or side effects to prescribing provider. ? ?Therapeutic Interventions: 1 on 1 counseling sessions, Psychoeducation, Medication administration, Evaluate responses to treatment, Monitor vital signs and CBGs as ordered, Perform/monitor CIWA, COWS, AIMS and Fall Risk screenings as ordered, Perform wound care treatments as ordered. ? ?Evaluation of Outcomes: Progressing ? ? ?LCSW Treatment Plan for Primary Diagnosis: Substance-induced psychotic disorder (HCC) ?Long Term Goal(s): Safe transition to appropriate next level of care at discharge, Engage patient in therapeutic group addressing interpersonal concerns. ? ?Short Term Goals: Engage patient in aftercare planning with referrals and resources, Increase social support, Increase emotional regulation, Facilitate acceptance of mental health diagnosis and concerns, Identify triggers associated with mental health/substance abuse issues, and Increase skills for wellness and recovery ? ?Therapeutic Interventions: Assess for all discharge needs, 1 to 1 time with Child psychotherapistocial worker, Explore available resources and support systems, Assess for adequacy in community support network, Educate family and significant other(s) on suicide prevention, Complete Psychosocial Assessment, Interpersonal group therapy. ? ?Evaluation of Outcomes:  Progressing ? ? ?Progress in Treatment: ?Attending groups: Yes. ?Participating in groups: Yes. ?Taking medication as prescribed: Yes. ?Toleration medication: Yes. ?Family/Significant other contact made: Yes, will contact:  Mother ?Patient understands diagnosis: No. ?Discussing patient identified problems/goals with staff: Yes.  ?Medical problems stabilized or resolved: Yes. ?Denies suicidal/homicidal ideation: Yes. ?Issues/concerns per patient self-inventory: No. ?Other: none reported ?  ?New problem(s) identified: No, Describe:  none reported ?  ?New Short Term/Long Term Goal(s) ?  ?medication stabilization, elimination of SI thoughts, development of comprehensive mental wellness plan.  ?  ?  ?Patient Goals:  Patient states that he would like to stabilize on meds.  ?  ?Discharge Plan or Barriers: Patient recently admitted. CSW will continue to follow and assess for appropriate referrals and possible discharge planning.  ?  ?  ?Reason for Continuation of Hospitalization: Delusions  ?Depression ?Hallucinations ?Medication stabilization ?Other; describe patient has been acting paranoid and bizarre ?  ?Estimated Length of Stay: 3 to 5 days  ? ?Last 3 [place] Suicide Severity Risk Score: ?Flowsheet Row [facility]  ?C-SSRS RISK CATEGORY No Risk No Risk No Risk  ? ?  ? ? ?Last PHQ 2/9 Scores: ? ?  10/13/2021  ?  2:26 PM 08/22/2020  ?  9:50 AM  ?Depression screen PHQ 2/9  ?Decreased Interest 1 3  ?Down, Depressed, Hopeless 1 2  ?PHQ - 2 Score 2 5  ?Altered sleeping 2 2  ?Tired, decreased energy 1 2  ?Change in appetite 0 0  ?Feeling bad or failure about yourself  1 2  ?Trouble concentrating 1 2  ?Moving slowly or fidgety/restless 1 1  ?  Suicidal thoughts 1 1  ?PHQ-9 Score 9 15  ?Difficult doing work/chores Very difficult Very  difficult  ? ? ?Scribe for Treatment Team: ?[name], LCSWA ?10/18/2021 ?2:58 PM ? ? ?

## 2021-10-18 NOTE — Progress Notes (Signed)
D:  Patient denied SI and HI, contracts for safety with RN.  Denied A/V hallucinations.  Denied pain. ?A:  Medications administered per MD orders.  Emotional support and encouragement given patient. ?R:  Safety maintained with 15 minute checks. ? ?Patient stated he slept 8 hours last night and is feeling better than yesterday. ? ?

## 2021-10-18 NOTE — Progress Notes (Signed)
[facility] ? ?10/18/2021 11:00 AM ?Devin Davis  ?MRN:  [id number] ? ?Subjective:   ?Patient states "I am trying to get back on track. I need to get back to work. I am doing better. I hope to be leaving soon." ? ? ?Brief History: Patient is a 36 year old male who denies having any previous formal psychiatric diagnosis, who was admitted to the psychiatric unit for evaluation and treatment of paranoia, hallucinations, insomnia, and bizarre behavior.  [facility] ? ?Daily Notes 10/18/21: Chart is reviewed, and findings shared with treatment team and discussed with Dr. Lucianne MussKumar. Patient is alert and oriented to time, place person and situation. Speech is clear and fluent with normal pattern and volume. Mood/Affect is appropriate and euthymic. Thought process is coherent and linear. Thought content is within normal limit and logical. Actively participating in therapeutic milieu and group activities although did not attend morning orientation/goal setting group activity.   Continue to monitor for safety, stabilization and response to medication therapy.  ? ?Patient reported feeling well and denies anxiety today. Rated depression as "3" out of 10. Denied suicidal ideation, homicidal ideation, auditory or visual hallucination. Endorsed sleeping for up to 6 hours last night. No acute discomfort noted today and denied any adverse effects to prescribed medications.  Patient stated he feels well and can be safe at home. Per chart review, patient was evaluated in the emergency department in February 2022, for paranoid thinking, and was attributed to use of ecstasy.  ?  ? ? Principal Problem: Substance-induced psychotic disorder (HCC) ? ?Diagnosis: Principal Problem: ?  Substance-induced psychotic disorder (HCC) ?Active Problems: ?  TOBACCO USE ?  Stimulant use disorder ?  Hypertension ? ?Total Time spent with patient: 15 minutes ? ?Past Psychiatric History: Denies previous psychiatric  hospitalization ?Denies history of suicide attempt ?Denies current psychiatric medication prescription ?Denies psychiatric medication history  ? ?Past Medical History:  ?Past Medical History:  ?Diagnosis Date  ? Fracture of metacarpal of right hand, closed 08/04/2010  ? Comminuted fx @ base  of [facility]  ? GERD (gastroesophageal reflux disease)   ? History reviewed. No pertinent surgical history. ? ?Family History: History reviewed. No pertinent family history. ? ?Family Psychiatric  History:  Grandmother dx with Bipolar ? ?Social History:  ?Social History  ? ?Substance and Sexual Activity  ?Alcohol Use Yes  ? Comment: occ  ?   ?Social History  ? ?Substance and Sexual Activity  ?Drug Use No  ?  ?Social History  ? ?Socioeconomic History  ? Marital status: Single  ?  Spouse name: Not on file  ? Number of children: Not on file  ? Years of education: Not on file  ? Highest education level: Not on file  ?Occupational History  ? Not on file  ?Tobacco Use  ? Smoking status: Every Day  ?  Packs/day: 1.00  ?  Types: Cigarettes, Cigars  ? Smokeless tobacco: Never  ?Vaping Use  ? Vaping Use: Never used  ?Substance and Sexual Activity  ? Alcohol use: Yes  ?  Comment: occ  ? Drug use: No  ? Sexual activity: Not Currently  ?Other Topics Concern  ? Not on file  ?Social History Narrative  ? Not on file  ? ?Social Determinants of Health  ? ?Financial Resource Strain: Not on file  ?Food Insecurity: Not on file  ?Transportation Needs: Not on file  ?Physical Activity: Not on file  ?Stress: Not on file  ?Social Connections:  Not on file  ? ?Additional Social History:  ?  ?Sleep: Good ? ?Appetite:  Good ? ?Current Medications: ?Current Facility-Administered Medications  ?Medication Dose Route Frequency Provider Last Rate Last Admin  ? acetaminophen (TYLENOL) tablet 650 mg  650 mg Oral Q6H PRN White, Patrice L, NP      ? alum & mag hydroxide-simeth (MAALOX/MYLANTA) 200-200-20 MG/5ML suspension 30 mL  30 mL Oral Q4H PRN White, Patrice L, NP       ? cloNIDine (CATAPRES) tablet 0.1 mg  0.1 mg Oral BID PRN Ntuen, Jesusita Oka, FNP      ? hydrOXYzine (ATARAX) tablet 25 mg  25 mg Oral TID PRN White, Patrice L, NP      ? magnesium hydroxide (MILK OF MAGNESIA) suspension 30 mL  30 mL Oral Daily PRN White, Patrice L, NP      ? metoprolol succinate (TOPROL-XL) 24 hr tablet 50 mg  50 mg Oral Daily Ntuen, Tina C, FNP   50 mg at 10/18/21 2979  ? OLANZapine (ZYPREXA) tablet 5 mg  5 mg Oral QHS White, Patrice L, NP   5 mg at 10/17/21 2136  ? potassium chloride SA (KLOR-CON M) CR tablet 40 mEq  40 mEq Oral Daily White, Patrice L, NP   40 mEq at 10/18/21 8921  ? traZODone (DESYREL) tablet 50 mg  50 mg Oral QHS PRN White, Patrice L, NP   50 mg at 10/17/21 2136  ? ?Lab Results:  ?No results found for this or any previous visit (from the past 48 hour(s)). ? ?Blood Alcohol level:  ?No results found for: [facility] ? ?Metabolic Disorder Labs: ?Lab Results  ?Component Value Date  ? HGBA1C 5.6 10/13/2021  ? MPG 114.02 10/13/2021  ? ?Lab Results  ?Component Value Date  ? PROLACTIN 1.8 (L) 10/13/2021  ? ?Lab Results  ?Component Value Date  ? CHOL 131 10/13/2021  ? TRIG 38 10/13/2021  ? HDL 60 10/13/2021  ? CHOLHDL 2.2 10/13/2021  ? VLDL 8 10/13/2021  ? LDLCALC 63 10/13/2021  ? ?Physical Findings: ?AIMS: Facial and Oral Movements ?Muscles of Facial Expression: None, normal ?Lips and Perioral Area: None, normal ?Jaw: None, normal ?Tongue: None, normal,Extremity Movements ?Upper (arms, wrists, hands, fingers): None, normal ?Lower (legs, knees, ankles, toes): None, normal, Trunk Movements ?Neck, shoulders, hips: None, normal, Overall Severity ?Severity of abnormal movements (highest score from questions above): None, normal ?Incapacitation due to abnormal movements: None, normal ?Patient's awareness of abnormal movements (rate only patient's report): No Awareness, Dental Status ?Current problems with teeth and/or dentures?: No ?Does patient usually wear dentures?: No  ?CIWA:    ?COWS:     ? ?Musculoskeletal: ?Strength & Muscle Tone: within normal limits ?Gait & Station: normal ?Patient leans: N/A ? ?Psychiatric Specialty Exam: ? ?Presentation  ?General Appearance: Appropriate for Environment; Casual; Fairly Groomed ? ?Eye Contact:Good ? ?Speech:Clear and Coherent; Normal Rate ? ?Speech Volume:Normal ? ?Handedness:Right ? ?Mood and Affect  ?Mood:Euthymic ? ?Affect:Appropriate ? ?Thought Process  ?Thought Processes:Coherent; Linear ? ?Descriptions of Associations:Intact ? ?Orientation:Full (Time, Place and Person) ? ?Thought Content:Logical; WDL ? ?History of Schizophrenia/Schizoaffective disorder:Yes ? ?Duration of Psychotic Symptoms:Less than six months (Substance Induced psychosis) ? ?Hallucinations:Hallucinations: None ? ?Ideas of Reference:None ? ?Suicidal Thoughts:Suicidal Thoughts: No ? ?Homicidal Thoughts:Homicidal Thoughts: No ? ?Sensorium  ?Memory:Immediate Fair; Recent Fair; Remote Fair ? ?Judgment:Fair ? ?Insight:Fair ? ?Executive Functions  ?Concentration:Good ? ?Attention Span:Good ? ?Recall:Fair ? ?Fund of Knowledge:Fair ? ?Language:Good ? ?Psychomotor Activity  ?Psychomotor Activity:Psychomotor Activity: Normal ? ?Assets  ?  Assets:Communication Skills; Physical Health ? ?Sleep  ?Sleep:Sleep: Good ?Number of Hours of Sleep: 9 ? ?Physical Exam: ?Physical Exam ?Vitals and nursing note reviewed.  ?Constitutional:   ?   Appearance: Normal appearance.  ?HENT:  ?   Head: Normocephalic and atraumatic.  ?   Right Ear: External ear normal.  ?   Left Ear: External ear normal.  ?   Nose: Nose normal.  ?   Mouth/Throat:  ?   Mouth: Mucous membranes are moist.  ?   Pharynx: Oropharynx is clear.  ?Eyes:  ?   Extraocular Movements: Extraocular movements intact.  ?   Conjunctiva/sclera: Conjunctivae normal.  ?   Pupils: Pupils are equal, round, and reactive to light.  ?Cardiovascular:  ?   Rate and Rhythm: Normal rate.  ?   Pulses: Normal pulses.  ?Pulmonary:  ?   Effort: Pulmonary effort is normal.   ?Abdominal:  ?   Palpations: Abdomen is soft.  ?Genitourinary: ?   Comments: Deferred ?Musculoskeletal:     ?   General: Normal range of motion.  ?   Cervical back: Normal range of motion and neck supple.  ?Skin: ?

## 2021-10-18 NOTE — BHH Group Notes (Signed)
Spiritual care group on grief and loss facilitated by chaplain Dyanne Carrel, [facility]  ? ?Group Goal:  ? ?Support / Education around grief and loss  ? ?Members engage in facilitated group support and psycho-social education.  ? ?Group Description:  ? ?Following introductions and group rules, group members engaged in facilitated group dialog and support around topic of loss, with particular support around experiences of loss in their lives. Group Identified types of loss (relationships / self / things) and identified patterns, circumstances, and changes that precipitate losses. Reflected on thoughts / feelings around loss, normalized grief responses, and recognized variety in grief experience. Group noted Worden's four tasks of grief in discussion.  ? ?Group drew on Adlerian / Rogerian, narrative, MI,  ? ?Patient Progress: Devin Davis attended group.  He showed engagement but did not participate verbally in the conversation. ? ?[employer], B[facility] ?Pager, [phone] ?

## 2021-10-18 NOTE — Group Note (Signed)
Recreation Therapy Group Note ? ? ?Group Topic:Stress Management  ?Group Date: 10/18/2021 ?Start Time: 69 ?End Time: 0955 ?Facilitators: Caroll Rancher, LRT,CTRS ?Location: [address] ? ? ?Goal Area(s) Addresses:  ?Patient will actively participate in stress management techniques presented during session.  ?Patient will successfully identify benefit of practicing stress management post d/c.  ?  ?Group Description:  Guided Imagery. LRT provided education, instruction, and demonstration on practice of visualization via guided imagery. Patient was asked to participate in the technique introduced during session. LRT debriefed including topics of mindfulness, stress management and specific scenarios each patient could use these techniques. Patients were given suggestions of ways to access scripts post d/c and encouraged to explore Youtube and other apps available on smartphones, tablets, and computers. ? ? ?Affect/Mood: N/A ?  ?Participation Level: Did not attend ?  ? ?Clinical Observations/Individualized Feedback:   ? ? ?Plan: Continue to engage patient in RT group sessions 2-3x/week. ? ? ?Caroll Rancher, LRT,CTRS ?10/18/2021 11:28 AM ?

## 2021-10-18 NOTE — Group Note (Signed)
LCSW Group Therapy Note ? ?Group Date: 10/18/2021 ?Start Time: 1300 ?End Time: 1400 ? ?Type of Therapy and Topic:  Group Therapy - Healthy vs Unhealthy Coping Skills ? ?Participation Level:  Active  ? ?Description of Group ?The focus of this group was to determine what unhealthy coping techniques typically are used by group members and what healthy coping techniques would be helpful in coping with various problems. Patients were guided in becoming aware of the differences between healthy and unhealthy coping techniques. Patients were asked to identify 2-3 healthy coping skills they would like to learn to use more effectively. ? ?Therapeutic Goals ?Patients learned that coping is what human beings do all day long to deal with various situations in their lives ?Patients defined and discussed healthy vs unhealthy coping techniques ?Patients identified their preferred coping techniques and identified whether these were healthy or unhealthy ?Patients determined 2-3 healthy coping skills they would like to become more familiar with and use more often. ?Patients provided support and ideas to each other ? ? ?Summary of Patient Progress:  The Pt attended group and accepted the handouts that were provided.  The Pt demonstrated understanding of the topic and was appropriate with their peers.  The Pt was able to identify coping skills and other ways to participate in self-care.  ? ? ?Therapeutic Modalities ?Cognitive Behavioral Therapy ?Motivational Interviewing ? ?Darleen Crocker, LCSWA ?10/18/2021  2:24 PM   ?

## 2021-10-19 DIAGNOSIS — F19959 Other psychoactive substance use, unspecified with psychoactive substance-induced psychotic disorder, unspecified: Secondary | ICD-10-CM

## 2021-10-19 MED ORDER — METOPROLOL SUCCINATE ER 50 MG PO TB24: 50.0000 mg | ORAL_TABLET | Freq: Every day | ORAL | 0 refills | Status: DC

## 2021-10-19 MED ORDER — NICOTINE 14 MG/24HR TD PT24: 14.0000 mg | MEDICATED_PATCH | Freq: Every day | TRANSDERMAL | 0 refills | Status: AC

## 2021-10-19 MED ORDER — TRAZODONE HCL 50 MG PO TABS: 50.0000 mg | ORAL_TABLET | Freq: Every evening | ORAL | 0 refills | Status: DC | PRN

## 2021-10-19 MED ORDER — OLANZAPINE 5 MG PO TABS
5.0000 mg | ORAL_TABLET | Freq: Every day | ORAL | 0 refills | Status: DC
Start: 2021-10-19 — End: 2021-12-29

## 2021-10-19 MED ORDER — NICOTINE 14 MG/24HR TD PT24
14.0000 mg | MEDICATED_PATCH | Freq: Every day | TRANSDERMAL | Status: DC
Start: 2021-10-20 — End: 2021-10-19
  Filled 2021-10-19: qty 1

## 2021-10-19 NOTE — Progress Notes (Signed)
?  [facility] Adult Case Management Discharge Plan : ? ?Will you be returning to the same living situation after discharge:  Yes,  living with parents ?At discharge, do you have transportation home?: Yes,  mother will be picking patient up ?Do you have the ability to pay for your medications: Yes,  provided resources for reduced cost medications ? ?Release of information consent forms completed and in the chart;  Patient's signature needed at discharge. ? ?Patient to Follow up at: ? Follow-up Information   ? ? [facility] Follow up.   ?Specialty: Behavioral Health ?Why: You may go to this provider for therapy and medication management services during walk in days:  Mondays and Wednesdays, at 7:30 am (services are provided on a first come, first served basis - please arrive early) ?Contact information: ?[address] ?[place] [address] ?[phone] ? ?  ?  ? ? [facility]. Call.   ?Why: Please call as soon as possible to schedule an appointment with this provider for primary care services. ?Contact information: ?[address] ?[place] [address] ?[phone] ? ?  ?  ? ? [facility] [employer]. Call.   ?Why: Call to learn about various support groups that they offer for people going through mental health issues. ?Contact information: ?[address] ?[place], [place] [address] ?[phone] ? ?  ?  ? ?  ?  ? ?  ? ? ?Next level of care provider has access to [facility] Link:yes ? ?Safety Planning and Suicide Prevention discussed: Yes,  mother ? ?  ? ?Has patient been referred to the Quitline?: Yes, faxed on 10/19/2021 ? ?Patient has been referred for addiction treatment: Yes, [facility] ? ?Alexsandro Salek E Andilyn Bettcher, LCSW ?10/19/2021, 10:07 AM ?

## 2021-10-19 NOTE — BHH Suicide Risk Assessment (Signed)
[facility] Discharge Suicide Risk Assessment ? ? ?Principal Problem: Substance-induced psychotic disorder (HCC) ?Discharge Diagnoses: Principal Problem: ?  Substance-induced psychotic disorder (HCC) ?Active Problems: ?  TOBACCO USE ?  Stimulant use disorder ?  Hypertension ? ? ?Total Time spent with patient: 20 minutes ? ?Patient is a 36 year old male who denies having any previous formal psychiatric diagnosis, who was admitted to the psychiatric unit for evaluation and treatment of paranoia, hallucinations, insomnia, and bizarre behavior. ? ?During the patient's hospitalization, patient had extensive initial psychiatric evaluation, and follow-up psychiatric evaluations every day. ?  ?Psychiatric diagnoses provided upon initial assessment:  ?-Substance-induced psychotic disorder ?-Rule out MDD ?  ?Patient's psychiatric medications were adjusted on admission:  ?-Continue Zyprexa 5 mg nightly.  Ordered at admission by admitting provider.  Patient agreeable to continuing this medication. ?-Continue potassium supplementation, as recommended by ED physician, 40 mEq once daily for a total of 7 days ?-Start clonidine for hypertension.  We will avoid other traditional antihypertensive medications due to low potassium ?  ?During the hospitalization, other adjustments were made to the patient's psychiatric medication regimen:  ?-Zyprexa was continued ?-clonidine was changed to metprolol XL 50 mg once daily for HTN ?-Potassium supplementation was completed  ?  ?Gradually, patient started adjusting to milieu.   ?Patient's care was discussed during the interdisciplinary team meeting every day during the hospitalization. ?  ?The patient denied having side effects to prescribed psychiatric medication. ?  ?The patient reports their target psychiatric symptoms of psychosis responded well to the psychiatric medications, and the patient reports overall benefit other psychiatric hospitalization. Supportive psychotherapy was provided to the  patient. The patient also participated in regular group therapy while admitted.  ?  ?Labs were reviewed with the patient, and abnormal results were discussed with the patient. ?  ?The patient denied having suicidal thoughts more than 48 hours prior to discharge.  Patient denies having homicidal thoughts.  Patient denies having auditory hallucinations.  Patient denies any visual hallucinations.  Patient denies having paranoid thoughts. ?  ?The patient is able to verbalize their individual safety plan to this provider. ?  ?It is recommended to the patient to continue psychiatric medications as prescribed, after discharge from the hospital.   ?  ?It is recommended to the patient to follow up with your outpatient psychiatric provider and PCP. ?  ?Discussed with the patient, the impact of alcohol, drugs, tobacco have been there overall psychiatric and medical wellbeing, and total abstinence from substance use was recommended the patient. ?  ? ? ?Musculoskeletal: ?Strength & Muscle Tone: within normal limits ?Gait & Station: normal ?Patient leans: N/A ? ?Psychiatric Specialty Exam ? ?Presentation  ?General Appearance: Casual ? ?Eye Contact:Good ? ?Speech:Normal Rate ? ?Speech Volume:Normal ? ?Handedness:Right ? ? ?Mood and Affect  ?Mood:Euthymic ? ?Duration of Depression Symptoms: Less than two weeks ? ?Affect:Appropriate; Congruent; Full Range ? ? ?Thought Process  ?Thought Processes:Goal Directed ? ?Descriptions of Associations:Intact ? ?Orientation:Full (Time, Place and Person) ? ?Thought Content:Logical ? ?History of Schizophrenia/Schizoaffective disorder:No ? ?Duration of Psychotic Symptoms:Less than six months ? ?Hallucinations:Hallucinations: None ? ?Ideas of Reference:None ? ?Suicidal Thoughts:Suicidal Thoughts: No ? ?Homicidal Thoughts:Homicidal Thoughts: No ? ? ?Sensorium  ?Memory:Immediate Good; Recent Good; Remote Good ? ?Judgment:Fair ? ?Insight:Fair ? ? ?Executive Functions  ?Concentration:Fair ? ?Attention  Span:Fair ? ?Recall:Fair ? ?Fund of Knowledge:Fair ? ?Language:Good ? ? ?Psychomotor Activity  ?Psychomotor Activity:Psychomotor Activity: Normal ? ? ?Assets  ?Assets:Communication Skills; Physical Health ? ? ?Sleep  ?Sleep:Sleep: Good ? ? ?Physical Exam: ?  Physical Exam See discharge summary ? ?ROS See discharge summary ? ?Blood pressure 126/81, pulse 69, temperature 98.4 ?F (36.9 ?C), temperature source Oral, resp. rate 18, height 5\' 9"  (1.753 m), weight 74.4 kg, SpO2 100 %. Body mass index is 24.22 kg/m?. ? ?Mental Status Per Nursing Assessment::   ?On Admission:  NA ? ?Demographic factors:  Male, Low socioeconomic status, Unemployed ?Loss Factors:  Decrease in vocational status, Financial problems / change in socioeconomic status ?Historical Factors:  NA ?Risk Reduction Factors:  Living with another person, especially a relative ? ?Continued Clinical Symptoms:  ? Psychosis - psychotic symptoms resolved. Denies SI. Denies HI.  ? ?Cognitive Features That Contribute To Risk:  ?None   ? ?Suicide Risk:  ?Mild:  There are no identifiable suicide plans, no associated intent, mild dysphoria and related symptoms, good self-control (both objective and subjective assessment), few other risk factors, and identifiable protective factors, including available and accessible social support. ? ? Follow-up Information   ? ? [facility] Follow up.   ?Specialty: Behavioral Health ?Why: You may go to this provider for therapy and medication management services during walk in days:  Mondays and Wednesdays, at 7:30 am (services are provided on a first come, first served basis - please arrive early) ?Contact information: ?[address] ?[place] ?[phone] ? ?  ?  ? ? [facility]. Call.   ?Why: Please call as soon as possible to schedule an appointment with this provider for primary care services. ?Contact information: ?[phone] ?[place] ?[phone] ? ?  ?  ? ? [phone] [facility]. Call.   ?Why: Call to learn about various support groups that they offer for people going through mental health issues. ?Contact information: ?[address] ?[place], [place] ?[phone] ? ?  ?  ? ?  ?  ? ?  ? ? ?Plan Of Care/Follow-up recommendations:  ? ?Activity: as tolerated ?  ?Diet: heart healthy ?  ?Other: ?-Follow-up with your outpatient psychiatric provider -instructions on appointment date, time, and address (location) are provided to you in discharge paperwork. ?  ?-Take your psychiatric medications as prescribed at discharge - instructions are provided to you in the discharge paperwork ?  ?-Follow-up with outpatient primary care doctor and other specialists -for management of chronic medical disease, including: hypokalemia (resolved) ?  ?-Testing: Follow-up with outpatient provider for abnormal lab results:  ?10/15/2021: Potassium  3.5 ?  ?-Recommend abstinence from alcohol, tobacco, and other illicit drug use at discharge.  ?  ?-If your psychiatric symptoms recur, worsen, or if you have side effects to your psychiatric medications, call your outpatient psychiatric provider, 911, [phone] or go to the nearest emergency department. ?  ?-If suicidal thoughts recur, call your outpatient psychiatric provider, 911, [phone] or go to the nearest emergency department. ? ? ?10/17/2021, MD ?10/19/2021, 10:12 AM ?

## 2021-10-19 NOTE — Progress Notes (Signed)
D:  Patient denied SI and HI, contracts for safety.  Denied A/V hallucinations.  Denied pain. A:  Medications administered per MD orders.  Emotional support and encouragement given patient. R:  Safety maintained with 15 minute checks.  

## 2021-10-19 NOTE — Discharge Summary (Signed)
Physician Discharge Summary Note ? ?Patient:  Devin Davis is an 36 y.o., male ?MRN:  [id number] ?DOB:  May 04, 1986 ?Patient phone:  [phone] (home)  ?Patient address:   ?[id number] ?[place] [address],  ?Total Time spent with patient: 20 minutes ? ?Date of Admission:  10/14/2021 ?Date of Discharge: 10-19-2021 ? ?Reason for Admission:   ?Patient is a 36 year old male who denies having any previous formal psychiatric diagnosis, who was admitted to the psychiatric unit for evaluation and treatment of paranoia, hallucinations, insomnia, and bizarre behavior. ? ?Principal Problem: Substance-induced psychotic disorder ([place]) ?Discharge Diagnoses: Principal Problem: ?  Substance-induced psychotic disorder ([place]) ?Active Problems: ?  TOBACCO USE ?  Stimulant use disorder ?  Hypertension ? ? ?Past Psychiatric History:  ?Diagnosis: Denies ?Denies previous psychiatric hospitalization ?Denies history of suicide attempt ?Denies current psychiatric medication prescription ?Denies psychiatric medication history ? ?Past Medical History:  ?Past Medical History:  ?Diagnosis Date  ? Fracture of metacarpal of right hand, closed 08/04/2010  ? Comminuted fx @ base  of [facility]  ? GERD (gastroesophageal reflux disease)   ? History reviewed. No pertinent surgical history. ?Family History: History reviewed. No pertinent family history. ?Family Psychiatric  History: Patient reports grandmother was diagnosed with bipolar disorder.  Denies family history of suicide attempts. ? ?Social History:  ?Social History  ? ?Substance and Sexual Activity  ?Alcohol Use Yes  ? Comment: occ  ?   ?Social History  ? ?Substance and Sexual Activity  ?Drug Use No  ?  ?Social History  ? ?Socioeconomic History  ? Marital status: Single  ?  Spouse name: Not on file  ? Number of children: Not on file  ? Years of education: Not on file  ? Highest education level: Not on file  ?Occupational History  ? Not on file  ?Tobacco Use  ? Smoking status: Every Day   ?  Packs/day: 1.00  ?  Types: Cigarettes, Cigars  ? Smokeless tobacco: Never  ?Vaping Use  ? Vaping Use: Never used  ?Substance and Sexual Activity  ? Alcohol use: Yes  ?  Comment: occ  ? Drug use: No  ? Sexual activity: Not Currently  ?Other Topics Concern  ? Not on file  ?Social History Narrative  ? Not on file  ? ?Social Determinants of Health  ? ?Financial Resource Strain: Not on file  ?Food Insecurity: Not on file  ?Transportation Needs: Not on file  ?Physical Activity: Not on file  ?Stress: Not on file  ?Social Connections: Not on file  ? ? ?Hospital Course:   ?During the patient's hospitalization, patient had extensive initial psychiatric evaluation, and follow-up psychiatric evaluations every day. ?  ?Psychiatric diagnoses provided upon initial assessment:  ?-Substance-induced psychotic disorder ?-Rule out MDD ?  ?Patient's psychiatric medications were adjusted on admission:  ?-Continue Zyprexa 5 mg nightly.  Ordered at admission by admitting provider.  Patient agreeable to continuing this medication. ?-Continue potassium supplementation, as recommended by ED physician, 40 mEq once daily for a total of 7 days ?-Start clonidine for hypertension.  We will avoid other traditional antihypertensive medications due to low potassium ?  ?During the hospitalization, other adjustments were made to the patient's psychiatric medication regimen:  ?-Zyprexa was continued ?-clonidine was changed to metprolol XL 50 mg once daily for HTN ?-Potassium supplementation was completed  ?  ?Gradually, patient started adjusting to milieu.   ?Patient's care was discussed during the interdisciplinary team meeting every day during the hospitalization. ?  ?The patient  denied having side effects to prescribed psychiatric medication. ?  ?The patient reports their target psychiatric symptoms of psychosis responded well to the psychiatric medications, and the patient reports overall benefit other psychiatric hospitalization. Supportive  psychotherapy was provided to the patient. The patient also participated in regular group therapy while admitted.  ?  ?Labs were reviewed with the patient, and abnormal results were discussed with the patient. ?  ?The patient denied having suicidal thoughts more than 48 hours prior to discharge.  Patient denies having homicidal thoughts.  Patient denies having auditory hallucinations.  Patient denies any visual hallucinations.  Patient denies having paranoid thoughts. ?  ?The patient is able to verbalize their individual safety plan to this provider. ?  ?It is recommended to the patient to continue psychiatric medications as prescribed, after discharge from the hospital.   ?  ?It is recommended to the patient to follow up with your outpatient psychiatric provider and PCP. ?  ?Discussed with the patient, the impact of alcohol, drugs, tobacco have been there overall psychiatric and medical wellbeing, and total abstinence from substance use was recommended the patient. ?  ? ?Physical Findings: ?AIMS: Facial and Oral Movements ?Muscles of Facial Expression: None, normal ?Lips and Perioral Area: None, normal ?Jaw: None, normal ?Tongue: None, normal,Extremity Movements ?Upper (arms, wrists, hands, fingers): None, normal ?Lower (legs, knees, ankles, toes): None, normal, Trunk Movements ?Neck, shoulders, hips: None, normal, Overall Severity ?Severity of abnormal movements (highest score from questions above): None, normal ?Incapacitation due to abnormal movements: None, normal ?Patient's awareness of abnormal movements (rate only patient's report): No Awareness, Dental Status ?Current problems with teeth and/or dentures?: No ?Does patient usually wear dentures?: No  ?CIWA:    ?COWS:    ? ?Musculoskeletal: ?Strength & Muscle Tone: within normal limits ?Gait & Station: normal ?Patient leans: N/A ? ? ?Psychiatric Specialty Exam: ? ?Presentation  ?General Appearance: Casual ? ?Eye Contact:Good ? ?Speech:Normal Rate ? ?Speech  Volume:Normal ? ?Handedness:Right ? ? ?Mood and Affect  ?Mood:Euthymic ? ?Affect:Appropriate; Congruent; Full Range ? ? ?Thought Process  ?Thought Processes:Goal Directed ? ?Descriptions of Associations:Intact ? ?Orientation:Full (Time, Place and Person) ? ?Thought Content:Logical ? ?History of Schizophrenia/Schizoaffective disorder:No ? ?Duration of Psychotic Symptoms:Less than six months ? ?Hallucinations:Hallucinations: None ? ?Ideas of Reference:None ? ?Suicidal Thoughts:Suicidal Thoughts: No ? ?Homicidal Thoughts:Homicidal Thoughts: No ? ? ?Sensorium  ?Memory:Immediate Good; Recent Good; Remote Good ? ?Judgment:Fair ? ?Insight:Fair ? ? ?Executive Functions  ?Concentration:Fair ? ?Attention Span:Fair ? ?Recall:Fair ? ?[place] ? ?Language:Good ? ? ?Psychomotor Activity  ?Psychomotor Activity:Psychomotor Activity: Normal ? ? ?Assets  ?Assets:Communication Skills; Physical Health ? ? ?Sleep  ?Sleep:Sleep: Good ? ? ? ?Physical Exam: ?Physical Exam ?Vitals reviewed.  ?Constitutional:   ?   General: He is not in acute distress. ?   Appearance: He is not toxic-appearing.  ?Pulmonary:  ?   Effort: Pulmonary effort is normal.  ?Neurological:  ?   Mental Status: He is alert.  ?   Motor: No weakness.  ?   Gait: Gait normal.  ?Psychiatric:     ?   Mood and Affect: Mood normal.     ?   Behavior: Behavior normal.     ?   Thought Content: Thought content normal.     ?   Judgment: Judgment normal.  ? ?Review of Systems  ?Constitutional:  Negative for chills and fever.  ?Cardiovascular:  Negative for chest pain and palpitations.  ?Neurological:  Negative for dizziness, tingling, tremors and headaches.  ?Psychiatric/Behavioral:  Positive for substance abuse. Negative for depression, hallucinations, memory loss and suicidal ideas. The patient is not nervous/anxious and does not have insomnia.   ?All other systems reviewed and are negative. ?Blood pressure 126/81, pulse 69, temperature 98.4 ?F (36.9 ?C), temperature  source Oral, resp. rate 18, height 5\' 9"  (1.753 m), weight 74.4 kg, SpO2 100 %. Body mass index is 24.22 kg/m?. ? ? ?Social History  ? ?Tobacco Use  ?Smoking Status Every Day  ? Packs/day: 1.00  ? Types: C

## 2021-10-19 NOTE — Discharge Instructions (Signed)
?-  Follow-up with your outpatient psychiatric provider -instructions on appointment date, time, and address (location) are provided to you in discharge paperwork. ? ?-Take your psychiatric medications as prescribed at discharge - instructions are provided to you in the discharge paperwork ?- ?Recommend using [email or web address] for coupons for prescriptions. ? ?-Follow-up with outpatient primary care doctor and other specialists -for management of chronic medical disease, including: hypokalemia (resolved) ? ?-Recommend abstinence from alcohol, tobacco, and other illicit drug use at discharge.  ? ?-If your psychiatric symptoms recur, worsen, or if you have side effects to your psychiatric medications, call your outpatient psychiatric provider, 911, [phone] or go to the nearest emergency department. ? ?-If suicidal thoughts recur, call your outpatient psychiatric provider, 911, [phone] or go to the nearest emergency department. ? ?

## 2021-10-19 NOTE — [facility] Suicide Risk Assessment (Deleted)
[facility] Admission Suicide Risk Assessment ? ? ?Nursing information obtained from:  Patient ?Demographic factors:  Male, Low socioeconomic status, Unemployed ?Current Mental Status:  NA ?Loss Factors:  Decrease in vocational status, Financial problems / change in socioeconomic status ?Historical Factors:  NA ?Risk Reduction Factors:  Living with another person, especially a relative ? ?Total Time spent with patient: 20 minutes ?Principal Problem: Substance-induced psychotic disorder ([place]) ?Diagnosis:  Principal Problem: ?  Substance-induced psychotic disorder ([place]) ?Active Problems: ?  TOBACCO USE ?  Stimulant use disorder ?  Hypertension ? ?Subjective Data:  ? Patient is a 36 year old male who denies having any previous formal psychiatric diagnosis, who was admitted to the psychiatric unit for evaluation and treatment of paranoia, hallucinations, insomnia, and bizarre behavior. ? ?During the patient's hospitalization, patient had extensive initial psychiatric evaluation, and follow-up psychiatric evaluations every day. ? ?Psychiatric diagnoses provided upon initial assessment:  ?-Substance-induced psychotic disorder ?-Rule out MDD ? ?Patient's psychiatric medications were adjusted on admission:  ?-Continue Zyprexa 5 mg nightly.  Ordered at admission by admitting provider.  Patient agreeable to continuing this medication. ?-Continue potassium supplementation, as recommended by ED physician, 40 mEq once daily for a total of 7 days ?-Start clonidine for hypertension.  We will avoid other traditional antihypertensive medications due to low potassium ? ?During the hospitalization, other adjustments were made to the patient's psychiatric medication regimen:  ?-Zyprexa was continued ?-clonidine was changed to metprolol XL 50 mg once daily for HTN ?-Potassium supplementation was completed  ? ?Gradually, patient started adjusting to milieu.   ?Patient's care was discussed during the interdisciplinary team meeting every day  during the hospitalization. ? ?The patient denied having side effects to prescribed psychiatric medication. ? ?The patient reports their target psychiatric symptoms of psychosis responded well to the psychiatric medications, and the patient reports overall benefit other psychiatric hospitalization. Supportive psychotherapy was provided to the patient. The patient also participated in regular group therapy while admitted.  ? ?Labs were reviewed with the patient, and abnormal results were discussed with the patient. ? ?The patient denied having suicidal thoughts more than 48 hours prior to discharge.  Patient denies having homicidal thoughts.  Patient denies having auditory hallucinations.  Patient denies any visual hallucinations.  Patient denies having paranoid thoughts. ? ?The patient is able to verbalize their individual safety plan to this provider. ? ?It is recommended to the patient to continue psychiatric medications as prescribed, after discharge from the hospital.   ? ?It is recommended to the patient to follow up with your outpatient psychiatric provider and PCP. ? ?Discussed with the patient, the impact of alcohol, drugs, tobacco have been there overall psychiatric and medical wellbeing, and total abstinence from substance use was recommended the patient. ? ? ?Continued Clinical Symptoms:  ?  ?The "Alcohol Use Disorders Identification Test", Guidelines for Use in Primary Care, Second Edition.  World Pharmacologist [facility]). ?Score between 0-7:  no or low risk or alcohol related problems. ?Score between 8-15:  moderate risk of alcohol related problems. ?Score between 16-19:  high risk of alcohol related problems. ?Score 20 or above:  warrants further diagnostic evaluation for alcohol dependence and treatment. ? ? ?CLINICAL FACTORS:  ? Psychosis - psychotic symptoms resolved. Denies SI. Denies HI.  ? ? ?Musculoskeletal: ?Strength & Muscle Tone: within normal limits ?Gait & Station: normal ?Patient leans:  N/A ? ?Psychiatric Specialty Exam: ? ?Presentation  ?General Appearance: Casual ? ?Eye Contact:Good ? ?Speech:Normal Rate ? ?Speech Volume:Normal ? ?Handedness:Right ? ? ?Mood and  Affect  ?Mood:Euthymic ? ?Affect:Appropriate; Congruent; Full Range ? ? ?Thought Process  ?Thought Processes:Goal Directed ? ?Descriptions of Associations:Intact ? ?Orientation:Full (Time, Place and Person) ? ?Thought Content:Logical ? ?History of Schizophrenia/Schizoaffective disorder:No ? ?Duration of Psychotic Symptoms:Less than six months ? ?Hallucinations:Hallucinations: None ? ?Ideas of Reference:None ? ?Suicidal Thoughts:Suicidal Thoughts: No ? ?Homicidal Thoughts:Homicidal Thoughts: No ? ? ?Sensorium  ?Memory:Immediate Good; Recent Good; Remote Good ? ?Judgment:Fair ? ?Insight:Fair ? ? ?Executive Functions  ?Concentration:Fair ? ?Attention Span:Fair ? ?Recall:Fair ? ?[place] ? ?Language:Good ? ? ?Psychomotor Activity  ?Psychomotor Activity:Psychomotor Activity: Normal ? ? ?Assets  ?Assets:Communication Skills; Physical Health ? ? ?Sleep  ?Sleep:Sleep: Good ? ? ? ?Physical Exam: ?Physical Exam See discharge summary ? ?ROS See discharge summary ? ?Blood pressure 126/81, pulse 69, temperature 98.4 ?F (36.9 ?C), temperature source Oral, resp. rate 18, height 5\' 9"  (1.753 m), weight 74.4 kg, SpO2 100 %. Body mass index is 24.22 kg/m?. ? ? ?COGNITIVE FEATURES THAT CONTRIBUTE TO RISK:  ?None   ? ?SUICIDE RISK:  ? Mild: There are no identifiable suicide plans, no associated intent, mild dysphoria and related symptoms, good self-control (both objective and subjective assessment), few other risk factors, and identifiable protective factors, including available and accessible social support. ? ?PLAN OF CARE:  ? ?Activity: as tolerated ? ?Diet: heart healthy ? ?Other: ?-Follow-up with your outpatient psychiatric provider -instructions on appointment date, time, and address (location) are provided to you in discharge  paperwork. ? ?-Take your psychiatric medications as prescribed at discharge - instructions are provided to you in the discharge paperwork ? ?-Follow-up with outpatient primary care doctor and other specialists -for management of chronic medical disease, including: hypokalemia (resolved) ? ?-Testing: Follow-up with outpatient provider for abnormal lab results:  ?10/15/2021: Potassium  3.5 ? ?-Recommend abstinence from alcohol, tobacco, and other illicit drug use at discharge.  ? ?-If your psychiatric symptoms recur, worsen, or if you have side effects to your psychiatric medications, call your outpatient psychiatric provider, 911, [phone] or go to the nearest emergency department. ? ?-If suicidal thoughts recur, call your outpatient psychiatric provider, 911, [phone] or go to the nearest emergency department. ? ? ?I certify that inpatient services furnished can reasonably be expected to improve the patient's condition.  ? ?Christoper Allegra, MD ?10/19/2021, 10:06 AM ? ?

## 2021-10-19 NOTE — Progress Notes (Signed)
Pt discharged to lobby. Pt was stable and appreciative at that time. All papers and prescriptions were given and valuables returned. Verbal understanding expressed. Denies SI/HI and A/VH. Pt given opportunity to express concerns and ask questions.  

## 2021-10-19 NOTE — Plan of Care (Signed)
Nurse discussed coping skills with patient.  

## 2021-12-29 ENCOUNTER — Encounter (HOSPITAL_COMMUNITY): Payer: Self-pay

## 2021-12-29 ENCOUNTER — Encounter (HOSPITAL_COMMUNITY): Payer: Self-pay | Admitting: Psychiatry

## 2021-12-29 ENCOUNTER — Ambulatory Visit (INDEPENDENT_AMBULATORY_CARE_PROVIDER_SITE_OTHER): Payer: No Payment, Other | Admitting: Psychiatry

## 2021-12-29 DIAGNOSIS — F19959 Other psychoactive substance use, unspecified with psychoactive substance-induced psychotic disorder, unspecified: Secondary | ICD-10-CM

## 2021-12-29 MED ORDER — OLANZAPINE 5 MG PO TABS: 5.0000 mg | ORAL_TABLET | Freq: Every day | ORAL | 1 refills | Status: DC

## 2021-12-29 MED ORDER — TRAZODONE HCL 50 MG PO TABS: 50.0000 mg | ORAL_TABLET | Freq: Every evening | ORAL | 0 refills | Status: DC | PRN

## 2021-12-29 NOTE — Progress Notes (Addendum)
Psychiatric Initial Adult Assessment   Patient Identification: Devin Davis MRN:  [id number] Date of Evaluation:  12/29/2021 Referral Source: [facility] Chief Complaint:  "I just come to get medication" Visit Diagnosis: Substance Induced Psychosis  Virtual Visit via Video Note  I connected with [name] on 12/29/21 at  9:00 AM EDT by a video enabled telemedicine application and verified that I am speaking with the correct person using two identifiers.  Location: Patient: clinic Provider: offsite   I discussed the limitations of evaluation and management by telemedicine and the availability of in person appointments. The patient expressed understanding and agreed to proceed.    I discussed the assessment and treatment plan with the patient. The patient was provided an opportunity to ask questions and all were answered. The patient agreed with the plan and demonstrated an understanding of the instructions.   The patient was advised to call back or seek an in-person evaluation if the symptoms worsen or if the condition fails to improve as anticipated.  I provided 15 minutes of non-face-to-face time during this encounter.   Mcneil Sober, NP   History of Present Illness:  Devin Davis is a 36 year old male presenting to [facility] for an initial psychiatric evaluation. 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 Patient was recently discharged from [facility] from an inpatient psychiatric hospitalization on 10/19/2021, after presenting to the ED on 10/13/2021.  Patient reports that he received his diagnosis of substance-induced psychosis during this hospitalization and denies a previous history of psychiatric diagnoses.  Patient reports depleting his medication supply and he last self administered his medication after completing his 30-day supply that he received upon  his psychiatric hospitalization discharge.  Today patient reports that he would like to resume his Zyprexa and trazodone, as it was effective with managing his symptoms.  Today patient reports auditory hallucinations of a male's voice talking but he is unable to determine what the voice is saying.  Patient reports last experiencing auditory hallucinations this morning, stating that he hears the voice periodically.  Patient reports that he first experienced auditory hallucinations 2 days prior to visit in the ED on October 13, 2021.  Patient reports that the voices resolved once he began taking Zyprexa and return when he depleted his medication supply.  Patient denies that the voices are command voices.  Patient also reports difficulty sleeping at times stating that he slept 6 to 7 hours last night but he waking up throughout the night.  Patient also reports worrying about life issues such as bills and providing for his children.  Patient is alert and oriented x4, calm, pleasant and willing to engage.  He reports a good mood and appetite.  He appears appropriately dressed for the weather and fairly groomed. Patient denies suicidal ideations, homicidal ideations, visual hallucinations or paranoia.  Patient is single and reports that he is currently living with a friend.  He has 4 children 1 son and 3 daughters, ages 71, 50, 1 and 39 years old.  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  He reports receiving his GED and finishing 1 year of college, but did not graduate.  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  Patient reports periodic use when he parties, last using ecstasy 2 days prior to visit in the ED on  October 13, 2021.  He reports history of smoking cigarettes but quit 5 to 6 weeks ago.  Patient does report that he vapes nicotine, stating that 1 cartridge last him 2 weeks.  Patient reports occasional alcohol intake stating that he drinks  approximately 1 day a week which consist of 1-2 beers and 2-3 shots of liquor.  He reports being incarcerated long ago but he was unwilling to share the details of his incarceration.  Patient reports that both of his biological parents are living and he has 4 sisters and 1 brother that are also living.  He reports a history of hypertension and denies a history of seizures.   Associated Signs/Symptoms: Depression Symptoms:  disturbed sleep, (Hypo) Manic Symptoms:  Hallucinations, Irritable Mood, Anxiety Symptoms:  Excessive Worry, Psychotic Symptoms:  Hallucinations: Auditory PTSD Symptoms: Negative  Past Psychiatric History: substance induced psychosis  Previous Psychotropic Medications: Yes   Substance Abuse History in the last 12 months:  Yes.    Consequences of Substance Abuse: Medical Consequences:  psychosis  Past Medical History:  Past Medical History:  Diagnosis Date   Fracture of metacarpal of right hand, closed 08/04/2010   Comminuted fx @ base  of R 4[facility]   GERD (gastroesophageal reflux disease)    No past surgical history on file.  Family Psychiatric History: Maternal Grandmother bipolar disorder.   Family History: Sister has a history of seizures.  Social History:  Social History   Socioeconomic History   Marital status: Single    Spouse name: Not on file   Number of children: Not on file   Years of education: Not on file   Highest education level: Not on file  Occupational History   Not on file  Tobacco Use   Smoking status: Every Day    Packs/day: 1.00    Types: Cigarettes, Cigars   Smokeless tobacco: Never  Vaping Use   Vaping Use: Never used  Substance and Sexual Activity   Alcohol use: Yes    Comment: occ   Drug use: No   Sexual activity: Not Currently  Other Topics Concern   Not on file  Social History Narrative   Not on file   Social Determinants of Health   Financial Resource Strain: Not on file  Food Insecurity: Not on file   Transportation Needs: Not on file  Physical Activity: Not on file  Stress: Not on file  Social Connections: Not on file    Additional Social History: n/a  Allergies:  No Known Allergies  Metabolic Disorder Labs: Lab Results  Component Value Date   HGBA1C 5.6 10/13/2021   MPG 114.02 10/13/2021   Lab Results  Component Value Date   PROLACTIN 1.8 (L) 10/13/2021   Lab Results  Component Value Date   CHOL 131 10/13/2021   TRIG 38 10/13/2021   HDL 60 10/13/2021   CHOLHDL 2.2 10/13/2021   VLDL 8 10/13/2021   LDLCALC 63 10/13/2021   Lab Results  Component Value Date   TSH 0.350 10/13/2021    Therapeutic Level Labs: No results found for: "LITHIUM" No results found for: "CBMZ" No results found for: "VALPROATE"  Current Medications: Current Outpatient Medications  Medication Sig Dispense Refill   metoprolol succinate (TOPROL-XL) 50 MG 24 hr tablet Take 1 tablet (50 mg total) by mouth daily. Take with or immediately following a meal. 30 tablet 0   OLANZapine (ZYPREXA) 5 MG tablet Take 1 tablet (5 mg total) by mouth at bedtime. 30 tablet 1  traZODone (DESYREL) 50 MG tablet Take 1 tablet (50 mg total) by mouth at bedtime as needed for sleep. 30 tablet 0   No current facility-administered medications for this visit.    Musculoskeletal: Strength & Muscle Tone:  n/a virtual visit Gait & Station:  n/a virtual visit. Patient leans: N/A  Psychiatric Specialty Exam: Review of Systems  Psychiatric/Behavioral:  Positive for hallucinations and sleep disturbance. Negative for self-injury and suicidal ideas. The patient is not nervous/anxious.   All other systems reviewed and are negative.   There were no vitals taken for this visit.There is no height or weight on file to calculate BMI.  General Appearance: Fairly Groomed  Eye Contact:  Good  Speech:  Clear and Coherent  Volume:  Normal  Mood:  Euthymic  Affect:  Congruent  Thought Process:  Coherent  Orientation:  Full  (Time, Place, and Person)  Thought Content:  Hallucinations: Auditory  Suicidal Thoughts:  No  Homicidal Thoughts:  No  Memory:  Immediate;   Good Recent;   Good Remote;   Good  Judgement:  Good  Insight:  Good  Psychomotor Activity:  Normal  Concentration:  Concentration: Good and Attention Span: Good  Recall:  Good  Fund of Knowledge:Good  Language: Good  Akathisia:  NA  Handed:  Right  AIMS (if indicated):  not done  Assets:  Architect Housing Social Support  ADL's:  Intact  Cognition: WNL  Sleep:  Poor   Screenings: AIMS    Flowsheet Row [facility]  AIMS Total Score 0      PHQ2-9    Flowsheet Row Counselor from 12/29/2021 in [facility] ED from 10/13/2021 in [facility] ED from 08/22/2020 in [facility]  PHQ-2 Total Score 0 2 5  PHQ-9 Total Score -- 9 15      Flowsheet Row [facility]) [facility] from 10/13/2021 in [facility] ED from [facility]/02/2021 in [facility]  C-SSRS RISK CATEGORY No Risk No Risk No Risk       Assessment and Plan: Jazziel Fitzsimmons is a 36 year old male presenting to [facility] for an initial psychiatric evaluation. 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 Patient was recently discharged from [facility] from an inpatient psychiatric hospitalization on 10/19/2021, after presenting to the ED on 10/13/2021.  Patient reports that he received his diagnosis of substance-induced psychosis during this hospitalization and denies a previous history of psychiatric diagnoses.  Patient reports depleting his medication  supply and he last self administered his medication after completing his 30-day supply that he received upon his psychiatric hospitalization discharge.  Today patient reports that he would like to resume his Zyprexa and trazodone, as it was effective with managing his symptoms.  Zyprexa 5 mg at bedtime and trazodone 50 mg at bedtime as needed for sleep refilled.  Individual psychotherapy discussed and patient declined the need for therapy at this time.   Collaboration of Care: Medication Management AEB medications refilled and E scribed to patient's preferred pharmacy.  Return to care in 6 weeks  1. Substance-induced psychotic disorder (HCC)     Meds ordered this encounter  Medications   OLANZapine (ZYPREXA) 5 MG tablet    Sig: Take  1 tablet (5 mg total) by mouth at bedtime.    Dispense:  30 tablet    Refill:  1    Order Specific Question:   Supervising Provider    Answer:   Nelly Rout [[id number]]   traZODone (DESYREL) 50 MG tablet    Sig: Take 1 tablet (50 mg total) by mouth at bedtime as needed for sleep.    Dispense:  30 tablet    Refill:  0    Order Specific Question:   Supervising Provider    Answer:   Nelly Rout [[id number]]       Patient/Guardian was advised Release of Information must be obtained prior to any record release in order to collaborate their care with an outside provider. Patient/Guardian was advised if they have not already done so to contact the registration department to sign all necessary forms in order for [place] to release information regarding their care.   Consent: Patient/Guardian gives verbal consent for treatment and assignment of benefits for services provided during this visit. Patient/Guardian expressed understanding and agreed to proceed.   Mcneil Sober, NP 6/28/202310:24 AM

## 2022-02-07 ENCOUNTER — Encounter (HOSPITAL_COMMUNITY): Payer: Federal, State, Local not specified - Other | Admitting: Psychiatry

## 2022-03-10 ENCOUNTER — Ambulatory Visit (HOSPITAL_COMMUNITY)
Admission: EM | Admit: 2022-03-10 | Discharge: 2022-03-11 | Disposition: A | Discharge status: Other Acute Inpatient Hospital | Payer: No Payment, Other | Source: Home / Self Care

## 2022-03-10 ENCOUNTER — Ambulatory Visit (HOSPITAL_COMMUNITY)
Admission: EM | Admit: 2022-03-10 | Discharge: 2022-03-10 | Disposition: A | Discharge status: Home / Self Care | Payer: No Payment, Other | Attending: Psychiatry | Admitting: Psychiatry

## 2022-03-10 DIAGNOSIS — F29 Unspecified psychosis not due to a substance or known physiological condition: Secondary | ICD-10-CM | POA: Insufficient documentation

## 2022-03-10 DIAGNOSIS — F919 Conduct disorder, unspecified: Secondary | ICD-10-CM | POA: Insufficient documentation

## 2022-03-10 DIAGNOSIS — Z79899 Other long term (current) drug therapy: Secondary | ICD-10-CM | POA: Insufficient documentation

## 2022-03-10 DIAGNOSIS — Z91148 Patient's other noncompliance with medication regimen for other reason: Secondary | ICD-10-CM | POA: Insufficient documentation

## 2022-03-10 DIAGNOSIS — R440 Auditory hallucinations: Secondary | ICD-10-CM | POA: Insufficient documentation

## 2022-03-10 DIAGNOSIS — Z20822 Contact with and (suspected) exposure to covid-19: Secondary | ICD-10-CM | POA: Insufficient documentation

## 2022-03-10 DIAGNOSIS — F69 Unspecified disorder of adult personality and behavior: Secondary | ICD-10-CM

## 2022-03-10 LAB — CBC WITH DIFFERENTIAL/PLATELET
Abs Immature Granulocytes: 0.02 10*3/uL (ref 0.00–0.07)
Basophils Absolute: 0.1 10*3/uL (ref 0.0–0.1)
Basophils Relative: 2 %
Eosinophils Absolute: 0.2 10*3/uL (ref 0.0–0.5)
Eosinophils Relative: 3 %
HCT: 46.3 % (ref 39.0–52.0)
Hemoglobin: 15.6 g/dL (ref 13.0–17.0)
Immature Granulocytes: 0 %
Lymphocytes Relative: 29 %
Lymphs Abs: 2 10*3/uL (ref 0.7–4.0)
MCH: 29.6 pg (ref 26.0–34.0)
MCHC: 33.7 g/dL (ref 30.0–36.0)
MCV: 87.9 fL (ref 80.0–100.0)
Monocytes Absolute: 0.5 10*3/uL (ref 0.1–1.0)
Monocytes Relative: 7 %
Neutro Abs: 4.1 10*3/uL (ref 1.7–7.7)
Neutrophils Relative %: 59 %
Platelets: 393 10*3/uL (ref 150–400)
RBC: 5.27 MIL/uL (ref 4.22–5.81)
RDW: 11.4 % — ABNORMAL LOW (ref 11.5–15.5)
WBC: 7 10*3/uL (ref 4.0–10.5)
nRBC: 0 % (ref 0.0–0.2)

## 2022-03-10 LAB — COMPREHENSIVE METABOLIC PANEL
ALT: 24 U/L (ref 0–44)
AST: 33 U/L (ref 15–41)
Albumin: 4.5 g/dL (ref 3.5–5.0)
Alkaline Phosphatase: 65 U/L (ref 38–126)
Anion gap: 7 (ref 5–15)
BUN: 9 mg/dL (ref 6–20)
CO2: 27 mmol/L (ref 22–32)
Calcium: 9.2 mg/dL (ref 8.9–10.3)
Chloride: 102 mmol/L (ref 98–111)
Creatinine, Ser: 0.94 mg/dL (ref 0.61–1.24)
GFR, Estimated: >60 mL/min (ref >60)
Glucose, Bld: 114 mg/dL — ABNORMAL HIGH (ref 70–99)
Potassium: 3.2 mmol/L — ABNORMAL LOW (ref 3.5–5.1)
Sodium: 136 mmol/L (ref 135–145)
Total Bilirubin: 1.5 mg/dL — ABNORMAL HIGH (ref 0.3–1.2)
Total Protein: 7.3 g/dL (ref 6.5–8.1)

## 2022-03-10 LAB — POCT URINE DRUG SCREEN - MANUAL ENTRY (I-SCREEN)
POC Amphetamine UR: NOT DETECTED
POC Amphetamine UR: NOT DETECTED
POC Buprenorphine (BUP): POSITIVE — AB
POC Buprenorphine (BUP): NOT DETECTED
POC Cocaine UR: NOT DETECTED
POC Cocaine UR: NOT DETECTED
POC Marijuana UR: NOT DETECTED
POC Marijuana UR: NOT DETECTED
POC Methadone UR: NOT DETECTED
POC Methadone UR: NOT DETECTED
POC Methamphetamine UR: NOT DETECTED
POC Methamphetamine UR: NOT DETECTED
POC Morphine: NOT DETECTED
POC Morphine: NOT DETECTED
POC Oxazepam (BZO): NOT DETECTED
POC Oxazepam (BZO): NOT DETECTED
POC Oxycodone UR: NOT DETECTED
POC Oxycodone UR: NOT DETECTED
POC Secobarbital (BAR): NOT DETECTED
POC Secobarbital (BAR): NOT DETECTED

## 2022-03-10 LAB — ETHANOL: Alcohol, Ethyl (B): <10 mg/dL (ref <10)

## 2022-03-10 LAB — RESP PANEL BY RT-PCR (FLU A&B, COVID) ARPGX2
Influenza A by PCR: NEGATIVE
Influenza B by PCR: NEGATIVE
SARS Coronavirus 2 by RT PCR: NEGATIVE

## 2022-03-10 MED ORDER — MAGNESIUM HYDROXIDE 400 MG/5ML PO SUSP: 30.0000 mL | Freq: Every day | ORAL | Status: DC | PRN

## 2022-03-10 MED ORDER — ACETAMINOPHEN 325 MG PO TABS: 650.0000 mg | ORAL_TABLET | Freq: Four times a day (QID) | ORAL | Status: DC | PRN

## 2022-03-10 MED ORDER — TRAZODONE HCL 50 MG PO TABS
50.0000 mg | ORAL_TABLET | Freq: Every evening | ORAL | Status: DC | PRN
  Administered 2022-03-10: 50 mg via ORAL
  Filled 2022-03-10: qty 1

## 2022-03-10 MED ORDER — HYDROXYZINE HCL 25 MG PO TABS
25.0000 mg | ORAL_TABLET | Freq: Three times a day (TID) | ORAL | Status: DC | PRN
  Administered 2022-03-10: 25 mg via ORAL
  Filled 2022-03-10: qty 1

## 2022-03-10 MED ORDER — ZIPRASIDONE MESYLATE 20 MG IM SOLR: 20.0000 mg | INTRAMUSCULAR | Status: DC | PRN

## 2022-03-10 MED ORDER — OLANZAPINE 5 MG PO TBDP: 5.0000 mg | ORAL_TABLET | Freq: Three times a day (TID) | ORAL | Status: DC | PRN

## 2022-03-10 MED ORDER — OLANZAPINE 5 MG PO TBDP
5.0000 mg | ORAL_TABLET | Freq: Two times a day (BID) | ORAL | Status: DC
Start: 2022-03-10 — End: 2022-03-11
  Administered 2022-03-10 – 2022-03-11 (×3): 5 mg via ORAL
  Filled 2022-03-10 (×3): qty 1

## 2022-03-10 MED ORDER — ALUM & MAG HYDROXIDE-SIMETH 200-200-20 MG/5ML PO SUSP: 30.0000 mL | ORAL | Status: DC | PRN

## 2022-03-10 MED ORDER — LORAZEPAM 1 MG PO TABS: 1.0000 mg | ORAL_TABLET | ORAL | Status: DC | PRN

## 2022-03-10 MED ORDER — METOPROLOL SUCCINATE ER 50 MG PO TB24: 50.0000 mg | ORAL_TABLET | Freq: Every day | ORAL | Status: DC

## 2022-03-10 MED ORDER — OLANZAPINE 5 MG PO TABS: 5.0000 mg | ORAL_TABLET | Freq: Every day | ORAL | Status: DC

## 2022-03-10 NOTE — ED Notes (Signed)
Pt sleeping in no acute distress. RR even and unlabored. Safety maintained. Will continue to monitor for safety.

## 2022-03-10 NOTE — ED Provider Notes (Signed)
NP was inform that patient left AMA.

## 2022-03-10 NOTE — Progress Notes (Addendum)
   03/10/22 0057  [facility] Triage Screening (Walk-ins at [facility] only)  How Did You Hear About [place]? Family/Friend  What Is the Reason for Your Visit/Call Today? Devin Davis is a 36 year old male presenting voluntary to [facility] due to auditory hallucinations. Patient denied SI, HI and alcohol/drug usage. When asked if voices were command, patient stated "they say I am doing something they want", no additional information given. Patient was accompanied by his mother, [name]. Patient gave consent for mother to be present. Per the patient's mother patient has been acting strange, has not slept in 2 to 3 days. Patient stated he is hearing voices telling me to do something that he is not. Patient lives at home with his mother, according to patient he works at [employer].  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 Patient is currently on parole for gun charges.  How Long Has This Been Causing You Problems? 1-6 months  Have You Recently Had Any Thoughts About Hurting Yourself? No  Are You Planning to Commit Suicide/Harm Yourself At This time? No  Have you Recently Had Thoughts About Hurting Someone Karolee Ohs? No  Are You Planning To Harm Someone At This Time? No  Are you currently experiencing any auditory, visual or other hallucinations? Yes  Please explain the hallucinations you are currently experiencing: hearing voices and ringing in ears  Have You Used Any Alcohol or Drugs in the Past 24 Hours? No  Do you have any current medical co-morbidities that require immediate attention? No  Clinician description of patient physical appearance/behavior: casual / cooperative  What Do You Feel Would Help You the Most Today? Treatment for Depression or other mood problem  Determination of Need Routine (7 days)  Options For Referral Outpatient Therapy;Medication Management;BH Urgent Care

## 2022-03-10 NOTE — ED Notes (Signed)
Pt awake  & resting at present, no distress noted. Calm & cooperative.  Monitoring for safety. 

## 2022-03-10 NOTE — Progress Notes (Addendum)
Pt was accepted to [facility] TOMORROW 03/10/22; Bed Assignment 404-1  Pt meets inpatient criteria per Sindy Guadeloupe, NP  Attending Physician will be Dr. Phineas Inches  Report can be called to: - Child and Adolescence unit: [phone] -Adult unit: [phone]  Pt can arrive after 0800  Care Team notified: Hansel Starling, RN, Harland Dingwall, Caren Farwell, [place], Delicia Emerald, [place], [facility] Fransico Michael, RN, [facility] Rosey Bath, RN , [employer], RN, Roddie Mc, RN, Nolberto Hanlon, RN, Johnny Chowoe,m Garvin Fila, RN, Liborio Nixon, NP   [place], [place] 03/10/2022 @ 8:59 PM

## 2022-03-10 NOTE — ED Notes (Signed)
Pt admitted to observation unit due to hallucinations. Pt unable to stay focused on topic at hand. Pt seem to be responding to internal stimuli but denies commands. Cooperative throughout assessment. Oriented to unit and unit rules. Will monitor for safety.

## 2022-03-10 NOTE — ED Notes (Signed)
Just arrived to the unit 

## 2022-03-10 NOTE — BH Assessment (Signed)
Comprehensive Clinical Assessment (CCA) Note  03/10/2022 Devin Davis [id number]  Disposition:  Per Liborio Nixon, NP, patient is recommended for inpatient treatment  The patient demonstrates the following risk factors for suicide: Chronic risk factors for suicide include: psychiatric disorder of psychosis and substance use disorder. Acute risk factors for suicide include:  none reported . Protective factors for this patient include: positive social support, responsibility to others (children, family), and hope for the future. Considering these factors, the overall suicide risk at this point appears to be low. Patient is not appropriate for outpatient follow up due to his current psychosis  AIMS    Flowsheet Row [facility]  AIMS Total Score 0      PHQ2-9    Flowsheet Row Counselor from 12/29/2021 in [facility] ED from 10/13/2021 in [facility] from 08/22/2020 in [facility] EMERGENCY DEPARTMENT  PHQ-2 Total Score 0 2 5  PHQ-9 Total Score -- 9 15      Flowsheet Row Ad[facility] ([facility]charged) [facility] from 10/13/2021 in [facility] ED from 03/02/2021 in [facility]  C-SSRS RISK CATEGORY No Risk No Risk No Risk        Chief Complaint:  Chief Complaint  Patient presents with   Hallucinations   Visit Diagnosis: F29 Unspecified Psychosis    CCA Screening, Triage and Referral (STR)  Patient Reported Information How did you hear about [place]? Family/Friend  What Is the Reason for Your Visit/Call Today? Patient presents to the [facility] stating that he has been hearing voices.  He states that he hears people talking and playing games with him.  He states that he started hearing voices six months ago, however, his sister  states that he heard voices six years ago.  Schizophrenia runs in his family, his grandmother and his uncles. Patient has not ever been diagnosed with schizophrenia. Patient denies any drug or alcohol use.  He denies past or present SI/HI.  Patient states that he has no slept in three days, but states that his appetite is fine.  Patient is urgent.  Per Liborio Nixon, NP Note/Evaluation: Devin Davis is a 36 year old male patient with a past psychiatric history significant for psychosis, substance-induced psychotic disorder, and stimulant use disorder who presents to the [facility] behavioral health urgent care voluntary accompanied by his sister Marisue Humble with a chief complaint of hallucinations.   Patient seen and evaluated face-to-face by this provider, and chart reviewed. On evaluation, patient is alert and oriented initially to time and place. He later states his full name at the end of the assessment. Patient is noted to have thought blocking and delayed responses during the assessment. He responds to questions using 1 or 2 words. He is noted to be walking in circles and sitting up and down throughout the assessment. He states that the voices are telling him to walk in circles and stand up. He appears to be responding to internal stimuli as he is noted to be mumbling to himself. He reports experiencing hallucinations for the past 2 months, however he is unable to further elaborate. He denies visual hallucinations at this time. He denies paranoia thoughts at this time but states that "sometimes" he experiences paranoia. He reports poor sleep. His sister states that he has not slept in 3 days. Patient is unable to answer the  question regarding his appetite, however his sister states that his appetite is fair and that he ate this morning. Patient denies drinking alcohol or using illicit drugs. He states that sometimes he takes Xanax to sleep and states that he last took Xanax from his home-boy a  couple months ago. He also reports abusing Adderall 6-8 months ago. His sister states that he drinks a lot of energy drinks, at least 10 to 12/day. Patient denies suicidal ideations. He denies self-injurious behaviors. He denies homicidal ideations. He states that he lives with his grandmother. He states that he takes medication but is unable to recall the name of the medications that he is currently taken. Patient [facility] up [facility] for medication management. Patient is currently prescribed Zyprexa 5 mg p.o. nightly.   Patient's sister states that the patient mother brought him in yesterday for an evaluation and he signed himself out. She states that this is her first time seeing her brother talking to himself and laughing at himself today. She states that she is here [place] from [place]. She states that the patient has not slept in 3 days. She states that the patient has a family history of schizophrenia, grandmother, aunts and uncles all have schizophrenia.   How Long Has This Been Causing You Problems? > than 6 months  What Do You Feel Would Help You the Most Today? Treatment for Depression or other mood problem   Have You Recently Had Any Thoughts About Hurting Yourself? No  Are You Planning to Commit Suicide/Harm Yourself At This time? No   Have you Recently Had Thoughts About Hurting Someone Karolee Ohslse? No  Are You Planning to Harm Someone at This Time? No  Explanation: No data recorded  Have You Used Any Alcohol or Drugs in the Past 24 Hours? No  How Long Ago Did You Use Drugs or Alcohol? No data recorded What Did You Use and How Much? unknown   Do You Currently Have a Therapist/Psychiatrist? No data recorded Name of Therapist/Psychiatrist: No data recorded  Have You Been Recently Discharged From Any Office Practice or Programs? No data recorded Explanation of Discharge From Practice/Program: No  data recorded    CCA Screening Triage Referral Assessment Type of Contact: No data recorded Telemedicine Service Delivery:   Is this Initial or Reassessment? No data recorded Date Telepsych consult ordered in [facility]:  No data recorded Time Telepsych consult ordered in [facility]:  No data recorded Location of Assessment: No data recorded Provider Location: No data recorded  Collateral Involvement: No data recorded  Does Patient Have a Court Appointed Legal Guardian? No data recorded Name and Contact of Legal Guardian: No data recorded If Minor and Not Living with Parent(s), Who has Custody? No data recorded Is CPS involved or ever been involved? No data recorded Is APS involved or ever been involved? No data recorded  Patient Determined To Be At Risk for Harm To Self or Others Based on Review of Patient Reported Information or Presenting Complaint? No data recorded Method: No data recorded Availability of Means: No data recorded Intent: No data recorded Notification Required: No data recorded Additional Information for Danger to Others Potential: No data recorded Additional Comments for Danger to Others Potential: No data recorded Are There Guns or Other Weapons in Your Home? No data recorded Types of Guns/Weapons: No data recorded Are These Weapons Safely Secured?  No data recorded Who Could Verify You Are Able To Have These Secured: No data recorded Do You Have any Outstanding Charges, Pending Court Dates, Parole/Probation? No data recorded Contacted To Inform of Risk of Harm To Self or Others: No data recorded   Does Patient Present under Involuntary Commitment? No data recorded IVC Papers Initial File Date: No data recorded  [place] of Residence: No data recorded  Patient Currently Receiving the Following Services: No data recorded  Determination of Need: Urgent (48 hours)   Options For Referral: Inpatient Hospitalization; Facility-Based Crisis; BH  Urgent Care     CCA Biopsychosocial Patient Reported Schizophrenia/Schizoaffective Diagnosis in Past: No   Strengths: not assessed   Mental Health Symptoms Depression:   Sleep (too much or little); Difficulty Concentrating   Duration of Depressive symptoms:  Duration of Depressive Symptoms: Less than two weeks   Mania:   None   Anxiety:    None   Psychosis:   Delusions   Duration of Psychotic symptoms:  Duration of Psychotic Symptoms: Less than six months   Trauma:   None   Obsessions:   None   Compulsions:   None   Inattention:   None   Hyperactivity/Impulsivity:   N/A   Oppositional/Defiant Behaviors:   N/A   Emotional Irregularity:   None   Other Mood/Personality Symptoms:   patient is paranoid and delusional    Mental Status Exam Appearance and self-care  Stature:   Average   Weight:   Average weight   Clothing:   Neat/clean; Casual   Grooming:   Well-groomed   Cosmetic use:   None   Posture/gait:   Normal   Motor activity:   Not Remarkable   Sensorium  Attention:   Normal   Concentration:   Normal   Orientation:   Object; Person; Place; Situation; Time   Recall/memory:   Normal   Affect and Mood  Affect:   Constricted; Flat   Mood:   Depressed   Relating  Eye contact:   Normal   Facial expression:   Depressed   Attitude toward examiner:   Cooperative; Guarded   Thought and Language  Speech flow:  Clear and Coherent   Thought content:   Appropriate to Mood and Circumstances   Preoccupation:   None   Hallucinations:   None   Organization:  No data recorded  [employer] of Knowledge:   Average   Intelligence:   Average   Abstraction:   Normal   Judgement:   Impaired   Reality Testing:   Realistic   Insight:   Lacking   Decision Making:   Impulsive   Social Functioning  Social Maturity:   Responsible   Social Judgement:   "Street Smart"   Stress   Stressors:   Armed forces operational officer; Relationship   Coping Ability:   Exhausted; Overwhelmed   Skill Deficits:   None   Supports:   Family     Religion: Religion/Spirituality Are You A Religious Person?: No  Leisure/Recreation: Leisure / Recreation Do You Have Hobbies?: Yes Leisure and Hobbies: He likes to play sports  Exercise/Diet: Exercise/Diet Do You Exercise?: No Have You Gained or Lost A Significant Amount of Weight in the Past Six Months?: No Do You Follow a Special Diet?: No Do You Have Any Trouble Sleeping?: Yes Explanation of Sleeping Difficulties: no sleep in 2-3 days   CCA Employment/Education Employment/Work Situation: Employment / Work Situation Employment Situation: Employed Work Stressors: No Patient's Job has Been Impacted  by Current Illness: Yes Describe how Patient's Job has Been Impacted: 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?: No  Education: Education Is Patient Currently Attending School?: No Last Grade Completed: 12 Did You Attend College?: Yes What Type of College Degree Do you Have?: Attended [employer], but did not complete a degree Did You Have An Individualized Education Program (IIEP): No Did You Have Any Difficulty At School?: No Patient's Education Has Been Impacted by Current Illness: No   CCA Family/Childhood History Family and Relationship History: Family history Marital status: Single Does patient have children?: Yes How many children?: 4 How is patient's relationship with their children?: "I have a good relationship with my kids."  Childhood History:  Childhood History By whom was/is the patient raised?: Mother Did patient suffer any verbal/emotional/physical/sexual abuse as a child?: No Did patient suffer from severe childhood neglect?: No Has patient ever been sexually abused/assaulted/raped as an adolescent or adult?: No Was the patient ever a victim of a crime or a disaster?: No Witnessed  domestic violence?: No Has patient been affected by domestic violence as an adult?: No  Child/Adolescent Assessment:     CCA Substance Use Alcohol/Drug Use: Alcohol / Drug Use Pain Medications: see MAR Prescriptions: see MAR Over the Counter: see MAR History of alcohol / drug use?: Yes Longest period of sobriety (when/how long): patient is currently stating that he has no history of drug or alcohol use, however, prior records indicate he abuses amphetamines and opioids by history Negative Consequences of Use: Legal, Financial, Personal relationships, Work / Programmer, multimedia Withdrawal Symptoms: None                         ASAM's:  Six Dimensions of Multidimensional Assessment  Dimension 1:  Acute Intoxication and/or Withdrawal Potential:   Dimension 1:  Description of individual's past and current experiences of substance use and withdrawal: Patient has no current signs or sypmtoms or withdrawal  Dimension 2:  Biomedical Conditions and Complications:   Dimension 2:  Description of patient's biomedical conditions and  complications: Patient has no current medical issues  Dimension 3:  Emotional, Behavioral, or Cognitive Conditions and Complications:  Dimension 3:  Description of emotional, behavioral, or cognitive conditions and complications: Patient appears to be self-medicating his mental health issues with drugs and alcohol  Dimension 4:  Readiness to Change:  Dimension 4:  Description of Readiness to Change criteria: Patient appears to have reservations with the steps that he needs to take to recover and is placing limitations on what he is willing to do.  Dimension 5:  Relapse, Continued use, or Continued Problem Potential:  Dimension 5:  Relapse, continued use, or continued problem potential critiera description: Patient has poor coping strategies, he has an extensive history of drug and alcohol use and has no prior treatment history.  Patient is at high risk for relapse.   Dimension 6:  Recovery/Living Environment:  Dimension 6:  Recovery/Iiving environment criteria description: Patient states that he is currently living in a stressful home envirnment and he is also stressed out because legal and relationship issues  ASAM Severity Score: ASAM's Severity Rating Score: 12  ASAM Recommended Level of Treatment:     Substance use Disorder (SUD) Substance Use Disorder (SUD)  Checklist Symptoms of Substance Use: Continued use despite having a persistent/recurrent physical/psychological problem caused/exacerbated by use, Continued use despite persistent or recurrent social, interpersonal problems, caused or exacerbated by use, Evidence of  tolerance, Persistent desire or unsuccessful efforts to cut down or control use, Large amounts of time spent to obtain, use or recover from the substance(s), Presence of craving or strong urge to use, Recurrent use that results in a failure to fulfill major role obligations (work, school, home), Substance(s) often taken in larger amounts or over longer times than was intended, Social, occupational, recreational activities given up or reduced due to use  Recommendations for Services/Supports/Treatments: Recommendations for Services/Supports/Treatments Recommendations For Services/Supports/Treatments: Residential-Level 3  Discharge Disposition:    DSM5 Diagnoses: Patient Active Problem List   Diagnosis Date Noted   Contusion of left wrist 10/17/2021   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   Hypertension 10/17/2021   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

## 2022-03-10 NOTE — Progress Notes (Signed)
Patient presents to the [facility] stating that he has been hearing voices.  He states that he hears people talking and playing games with him.  He states that he started hearing voices six months ago, however, his sister states that he heard voices six years ago.  Schizophrenia runs in his family, his grandmother and his uncles. Patient has not ever been diagnosed with schizophrenia. Patient denies any drug or alcohol use.  He denies past or present SI/HI.  Patient states that he has no slept in three days, but states that his appetite is fine.  Patient is urgent.

## 2022-03-10 NOTE — ED Provider Notes (Signed)
[facility] Continuous Assessment Admission H&P  Date: 03/10/22 Patient Name: Devin Davis MRN: [id number] Chief Complaint:  Chief Complaint  Patient presents with   Hallucinations      Diagnoses:  Final diagnoses:  Psychosis, unspecified psychosis type (HCC)    HPI: Devin Davis is a 36 year old male patient with a past psychiatric history significant for psychosis, substance-induced psychotic disorder, and stimulant use disorder who presents to the [facility] behavioral health urgent care voluntary accompanied by his sister Marisue Humble with a chief complaint of hallucinations.  Patient seen and evaluated face-to-face by this provider, and chart reviewed. On evaluation, patient is alert and oriented initially to time and place. He later states his full name at the end of the assessment. Patient is noted to have thought blocking and delayed responses during the assessment. He responds to questions using 1 or 2 words. He is noted to be walking in circles and sitting up and down throughout the assessment. He states that the voices are telling him to walk in circles and stand up. He appears to be responding to internal stimuli as he is noted to be mumbling to himself. He reports experiencing hallucinations for the past 2 months, however he is unable to further elaborate. He denies visual hallucinations at this time. He denies paranoia thoughts at this time but states that "sometimes" he experiences paranoia. He reports poor sleep. His sister states that he has not slept in 3 days. Patient is unable to answer the question regarding his appetite, however his sister states that his appetite is fair and that he ate this morning. Patient denies drinking alcohol or using illicit drugs. He states that sometimes he takes Xanax to sleep and states that he last took Xanax from his home-boy a couple months ago. He also reports abusing Adderall 6-8 months ago. His sister states that he drinks a  lot of energy drinks, at least 10 to 12/day. Patient denies suicidal ideations. He denies self-injurious behaviors. He denies homicidal ideations. He states that he lives with his grandmother. He states that he takes medication but is unable to recall the name of the medications that he is currently taken. Patient follows up with outpatient psychiatric services here at the [facility] behavioral health outpatient for medication management. Patient is currently prescribed Zyprexa 5 mg p.o. nightly.  Patient's sister states that the patient mother brought him in yesterday for an evaluation and he signed himself out. She states that this is her first time seeing her brother talking to himself and laughing at himself today. She states that she is here visiting from [place]. She states that the patient has not slept in 3 days. She states that the patient has a family history of schizophrenia, grandmother, aunts and uncles all have schizophrenia.  PHQ 2-9:  Flowsheet Row ED from 10/13/2021 in [facility] ED from 08/22/2020 in [facility] EMERGENCY DEPARTMENT  Thoughts that you would be better off dead, or of hurting yourself in some way Several days Several days  PHQ-9 Total Score 9 15       Flowsheet Row [facility]) [facility] from 10/13/2021 in [facility] ED from 03/02/2021 in [facility]  C-SSRS RISK CATEGORY No Risk No Risk No Risk        Total Time spent with patient: 45 minutes  Musculoskeletal  Strength & Muscle Tone: within normal limits Gait &  Station: normal Patient leans: N/A  Psychiatric Specialty Exam  Presentation General Appearance: Appropriate for Environment  Eye Contact:Minimal  Speech:Slow  Speech Volume:Decreased  Handedness:Right   Mood and Affect   Mood:Anxious  Affect:Congruent   Thought Process  Thought Processes:Linear  Descriptions of Associations:Loose  Orientation:Full (Time, Place and Person)  Thought Content:Logical  Diagnosis of Schizophrenia or Schizoaffective disorder in past: No  Duration of Psychotic Symptoms: Less than six months  Hallucinations:Hallucinations: Auditory; Command Description of Auditory Hallucinations: hear voices tell me to do things  Ideas of Reference:None  Suicidal Thoughts:Suicidal Thoughts: No  Homicidal Thoughts:Homicidal Thoughts: No   Sensorium  Memory:Immediate Fair  Judgment:Impaired  Insight:Lacking   Executive Functions  Concentration:Poor  Attention Span:Poor  Recall:Poor  Fund of Knowledge:Poor  Language:Poor   Psychomotor Activity  Psychomotor Activity:Psychomotor Activity: Restlessness   Assets  Assets:Communication Skills; Desire for Improvement; Housing; Social Support   Sleep  Sleep:Sleep: Fair Number of Hours of Sleep: 0   Nutritional Assessment (For OBS and FBC admissions only) Has the patient had a weight loss or gain of 10 pounds or more in the last 3 months?: No Has the patient had a decrease in food intake/or appetite?: No Does the patient have dental problems?: No Does the patient have eating habits or behaviors that may be indicators of an eating disorder including binging or inducing vomiting?: No Has the patient recently lost weight without trying?: 0 Has the patient been eating poorly because of a decreased appetite?: 0 Malnutrition Screening Tool Score: 0    Physical Exam HENT:     Head: Normocephalic.     Nose: Nose normal.  Eyes:     Conjunctiva/sclera: Conjunctivae normal.  Cardiovascular:     Rate and Rhythm: Normal rate.  Pulmonary:     Effort: Pulmonary effort is normal.  Musculoskeletal:        General: Normal range of motion.     Cervical back: Normal range of motion.  Neurological:     Mental Status: He is  alert and oriented to person, place, and time.    Review of Systems  Constitutional: Negative.   HENT: Negative.    Eyes: Negative.   Respiratory: Negative.    Cardiovascular: Negative.   Gastrointestinal: Negative.   Genitourinary: Negative.   Musculoskeletal: Negative.   Skin: Negative.   Neurological: Negative.   Endo/Heme/Allergies: Negative.   Psychiatric/Behavioral:  Positive for hallucinations.     Blood pressure (!) 154/84, pulse 91, temperature 99.3 F (37.4 C), resp. rate 18, height 5\' 9"  (1.753 m), weight 176 lb (79.8 kg), SpO2 99 %. Body mass index is 25.99 kg/m.  Past Psychiatric History: History of stimulant use disorder, and substance induced psychotic disorder  Is the patient at risk to self? No  Has the patient been a risk to self in the past 6 months? No .    Has the patient been a risk to self within the distant past? No   Is the patient a risk to others? No   Has the patient been a risk to others in the past 6 months? No   Has the patient been a risk to others within the distant past? No   Past Medical History:  Past Medical History:  Diagnosis Date   Fracture of metacarpal of right hand, closed 08/04/2010   Comminuted fx @ base  of R 4th [facility]   GERD (gastroesophageal reflux disease)    No past surgical history on file.  Family History: No family history on file.  Social History:  Social History   Socioeconomic History   Marital status: Single    Spouse name: Not on file   Number of children: Not on file   Years of education: Not on file   Highest education level: Not on file  Occupational History   Not on file  Tobacco Use   Smoking status: Every Day    Types: E-cigarettes   Smokeless tobacco: Never  Vaping Use   Vaping Use: Never used  Substance and Sexual Activity   Alcohol use: Yes    Alcohol/week: 4.0 standard drinks of alcohol    Types: 2 Cans of beer, 2 Shots of liquor per week    Comment: occ   Drug use: Not Currently   Sexual  activity: Not Currently  Other Topics Concern   Not on file  Social History Narrative   Not on file   Social Determinants of Health   Financial Resource Strain: Not on file  Food Insecurity: Not on file  Transportation Needs: Not on file  Physical Activity: Not on file  Stress: Not on file  Social Connections: Not on file  Intimate Partner Violence: Not on file    SDOH:  SDOH Screenings   Alcohol Screen: Low Risk  (10/14/2021)  Depression (PHQ2-9): Low Risk  (12/29/2021)  Recent Concern: Depression (PHQ2-9) - Medium Risk (10/13/2021)  Tobacco Use: High Risk (12/29/2021)    Last Labs:  Admission on 03/10/2022, Discharged on 03/10/2022  Component Date Value Ref Range Status   POC Amphetamine UR 03/10/2022 None Detected  NONE DETECTED (Cut Off Level 1000 ng/mL) Preliminary   POC Secobarbital (BAR) 03/10/2022 None Detected  NONE DETECTED (Cut Off Level 300 ng/mL) Preliminary   POC Buprenorphine (BUP) 03/10/2022 Positive (A)  NONE DETECTED (Cut Off Level 10 ng/mL) Preliminary   POC Oxazepam (BZO) 03/10/2022 None Detected  NONE DETECTED (Cut Off Level 300 ng/mL) Preliminary   POC Cocaine UR 03/10/2022 None Detected  NONE DETECTED (Cut Off Level 300 ng/mL) Preliminary   POC Methamphetamine UR 03/10/2022 None Detected  NONE DETECTED (Cut Off Level 1000 ng/mL) Preliminary   POC Morphine 03/10/2022 None Detected  NONE DETECTED (Cut Off Level 300 ng/mL) Preliminary   POC Methadone UR 03/10/2022 None Detected  NONE DETECTED (Cut Off Level 300 ng/mL) Preliminary   POC Oxycodone UR 03/10/2022 None Detected  NONE DETECTED (Cut Off Level 100 ng/mL) Preliminary   POC Marijuana UR 03/10/2022 None Detected  NONE DETECTED (Cut Off Level 50 ng/mL) Preliminary  Admission on 10/14/2021, Discharged on 10/19/2021  Component Date Value Ref Range Status   Sodium 10/15/2021 141  135 - 145 mmol/L Final   Potassium 10/15/2021 3.5  3.5 - 5.1 mmol/L Final   Chloride 10/15/2021 108  98 - 111 mmol/L Final    CO2 10/15/2021 25  22 - 32 mmol/L Final   Glucose, Bld 10/15/2021 170 (H)  70 - 99 mg/dL Final   Glucose reference range applies only to samples taken after fasting for at least 8 hours.   BUN 10/15/2021 11  6 - 20 mg/dL Final   Creatinine, Ser 10/15/2021 0.79  0.61 - 1.24 mg/dL Final   Calcium 40/98/1191 9.2  8.9 - 10.3 mg/dL Final   GFR, Estimated 10/15/2021 >60  >60 mL/min Final   Comment: (NOTE) Calculated using the CKD-EPI Creatinine Equation (2021)    Anion gap 10/15/2021 8  5 - 15 Final   Performed at [facility], [address] [place], [place] [address]  Admission on  10/13/2021, Discharged on 10/14/2021  Component Date Value Ref Range Status   SARS Coronavirus 2 by RT PCR 10/13/2021 NEGATIVE  NEGATIVE Final   Comment: (NOTE) SARS-CoV-2 target nucleic acids are NOT DETECTED.  The SARS-CoV-2 RNA is generally detectable in upper respiratory specimens during the acute phase of infection. The lowest concentration of SARS-CoV-2 viral copies this assay can detect is 138 copies/mL. A negative result does not preclude SARS-Cov-2 infection and should not be used as the sole basis for treatment or other patient management decisions. A negative result may occur with  improper specimen collection/handling, submission of specimen other than nasopharyngeal swab, presence of viral mutation(s) within the areas targeted by this assay, and inadequate number of viral copies(<138 copies/mL). A negative result must be combined with clinical observations, patient history, and epidemiological information. The expected result is Negative.  Fact Sheet for Patients:  BloggerCourse.comhttps://www.fda.gov/media/152166/download  Fact Sheet for Healthcare Providers:  SeriousBroker.ithttps://www.fda.gov/media/152162/download  This test is no                          t yet approved or cleared by the [place] FDA and  has been authorized for detection and/or diagnosis of SARS-CoV-2 by FDA under an Emergency  Use Authorization (EUA). This EUA will remain  in effect (meaning this test can be used) for the duration of the COVID-19 declaration under Section 564(b)(1) of the Act, 21 U.S.C.section 360bbb-3(b)(1), unless the authorization is terminated  or revoked sooner.       Influenza A by PCR 10/13/2021 NEGATIVE  NEGATIVE Final   Influenza B by PCR 10/13/2021 NEGATIVE  NEGATIVE Final   Comment: (NOTE) The Xpert Xpress SARS-CoV-2/FLU/RSV plus assay is intended as an aid in the diagnosis of influenza from Nasopharyngeal swab specimens and should not be used as a sole basis for treatment. Nasal washings and aspirates are unacceptable for Xpert Xpress SARS-CoV-2/FLU/RSV testing.  Fact Sheet for Patients: BloggerCourse.comhttps://www.fda.gov/media/152166/download  Fact Sheet for Healthcare Providers: SeriousBroker.ithttps://www.fda.gov/media/152162/download  This test is not yet approved or cleared by the [place] FDA and has been authorized for detection and/or diagnosis of SARS-CoV-2 by FDA under an Emergency Use Authorization (EUA). This EUA will remain in effect (meaning this test can be used) for the duration of the COVID-19 declaration under Section 564(b)(1) of the Act, 21 U.S.C. section 360bbb-3(b)(1), unless the authorization is terminated or revoked.  Performed at [facility], [address] [place], [place] [id number]    WBC 10/13/2021 8.7  4.0 - 10.5 K/uL Final   RBC 10/13/2021 5.09  4.22 - 5.81 MIL/uL Final   Hemoglobin 10/13/2021 15.2  13.0 - 17.0 g/dL Final   HCT 60/45/409804/06/2022 46.1  39.0 - 52.0 % Final   MCV 10/13/2021 90.6  80.0 - 100.0 fL Final   MCH 10/13/2021 29.9  26.0 - 34.0 pg Final   MCHC 10/13/2021 33.0  30.0 - 36.0 g/dL Final   RDW 11/91/478204/06/2022 12.3  11.5 - 15.5 % Final   Platelets 10/13/2021 405 (H)  150 - 400 K/uL Final   nRBC 10/13/2021 0.0  0.0 - 0.2 % Final   Neutrophils Relative % 10/13/2021 62  % Final   Neutro Abs 10/13/2021 5.5  1.7 - 7.7 K/uL Final   Lymphocytes Relative  10/13/2021 26  % Final   Lymphs Abs 10/13/2021 2.3  0.7 - 4.0 K/uL Final   Monocytes Relative 10/13/2021 6  % Final   Monocytes Absolute 10/13/2021 0.5  0.1 - 1.0 K/uL  Final   Eosinophils Relative 10/13/2021 4  % Final   Eosinophils Absolute 10/13/2021 0.3  0.0 - 0.5 K/uL Final   Basophils Relative 10/13/2021 2  % Final   Basophils Absolute 10/13/2021 0.1  0.0 - 0.1 K/uL Final   Immature Granulocytes 10/13/2021 0  % Final   Abs Immature Granulocytes 10/13/2021 0.03  0.00 - 0.07 K/uL Final   Performed at [facility], [address] [place], [place] [address]   Sodium 10/13/2021 141  135 - 145 mmol/L Final   Potassium 10/13/2021 2.8 (L)  3.5 - 5.1 mmol/L Final   Chloride 10/13/2021 105  98 - 111 mmol/L Final   CO2 10/13/2021 27  22 - 32 mmol/L Final   Glucose, Bld 10/13/2021 179 (H)  70 - 99 mg/dL Final   Glucose reference range applies only to samples taken after fasting for at least 8 hours.   BUN 10/13/2021 <5 (L)  6 - 20 mg/dL Final   Creatinine, Ser 10/13/2021 0.83  0.61 - 1.24 mg/dL Final   Calcium 68/34/1962 9.1  8.9 - 10.3 mg/dL Final   Total Protein 22/97/9892 6.5  6.5 - 8.1 g/dL Final   Albumin 11/94/1740 3.9  3.5 - 5.0 g/dL Final   AST 81/44/8185 26  15 - 41 U/L Final   ALT 10/13/2021 19  0 - 44 U/L Final   Alkaline Phosphatase 10/13/2021 56  38 - 126 U/L Final   Total Bilirubin 10/13/2021 0.4  0.3 - 1.2 mg/dL Final   GFR, Estimated 10/13/2021 >60  >60 mL/min Final   Comment: (NOTE) Calculated using the CKD-EPI Creatinine Equation (2021)    Anion gap 10/13/2021 9  5 - 15 Final   Performed at [facility], [address] [place], [place] [address]   Hgb A1c MFr Bld 10/13/2021 5.6  4.8 - 5.6 % Final   Comment: (NOTE) Pre diabetes:          5.7%-6.4%  Diabetes:              >6.4%  Glycemic control for   <7.0% adults with diabetes    Mean Plasma Glucose 10/13/2021 114.02  mg/dL Final   Performed at [facility], [address] [place], [place]  [address]   Cholesterol 10/13/2021 131  0 - 200 mg/dL Final   Triglycerides 78/58/8502 38  <150 mg/dL Final   HDL 77/41/2878 60  >40 mg/dL Final   Total CHOL/HDL Ratio 10/13/2021 2.2  RATIO Final   VLDL 10/13/2021 8  0 - 40 mg/dL Final   LDL Cholesterol 10/13/2021 63  0 - 99 mg/dL Final   Comment:        Total Cholesterol/HDL:CHD Risk Coronary Heart Disease Risk Table                     Men   Women  1/2 Average Risk   3.4   3.3  Average Risk       5.0   4.4  2 X Average Risk   9.6   7.1  3 X Average Risk  23.4   11.0        Use the calculated Patient Ratio above and the CHD Risk Table to determine the patient's CHD Risk.        ATP III CLASSIFICATION (LDL):  <100     mg/dL   Optimal  676-720  mg/dL   Near or Above  Optimal  130-159  mg/dL   Borderline  932-355  mg/dL   High  >732     mg/dL   Very High Performed at [facility], [address] [place], [place] [address]    Prolactin 10/13/2021 1.8 (L)  4.0 - 15.2 ng/mL Final   Comment: (NOTE) Performed At: [facility] [address] [place], [place] [id number] Jolene Schimke MD [id number]    TSH 10/13/2021 0.350  0.350 - 4.500 uIU/mL Final   Comment: Performed by a 3rd Generation assay with a functional sensitivity of <=0.01 uIU/mL. Performed at [facility], [address], [place], [place] [address]    POC Amphetamine UR 10/13/2021 None Detected  NONE DETECTED (Cut Off Level 1000 ng/mL) Final   POC Secobarbital (BAR) 10/13/2021 None Detected  NONE DETECTED (Cut Off Level 300 ng/mL) Final   POC Buprenorphine (BUP) 10/13/2021 None Detected  NONE DETECTED (Cut Off Level 10 ng/mL) Final   POC Oxazepam (BZO) 10/13/2021 None Detected  NONE DETECTED (Cut Off Level 300 ng/mL) Final   POC Cocaine UR 10/13/2021 None Detected  NONE DETECTED (Cut Off Level 300 ng/mL) Final   POC Methamphetamine UR 10/13/2021 None Detected  NONE DETECTED (Cut Off Level 1000 ng/mL) Final   POC Morphine  10/13/2021 None Detected  NONE DETECTED (Cut Off Level 300 ng/mL) Final   POC Oxycodone UR 10/13/2021 None Detected  NONE DETECTED (Cut Off Level 100 ng/mL) Final   POC Methadone UR 10/13/2021 None Detected  NONE DETECTED (Cut Off Level 300 ng/mL) Final   POC Marijuana UR 10/13/2021 None Detected  NONE DETECTED (Cut Off Level 50 ng/mL) Final    Allergies: Patient has no known allergies.  PTA Medications: (Not in a hospital admission)   Medical Decision Making  Patient is psychotic. Patient admitted to the [facility] continuous assessment unit and recommended for inpatient psychiatric treatment.  Lab Orders         Resp Panel by RT-PCR (Flu A&B, Covid) Anterior Nasal Swab         CBC with Differential/Platelet         Comprehensive metabolic panel         Ethanol         POCT Urine Drug Screen - (I-Screen)    EKG  Medications:  Zyprexa 5 mg po BID for psychosis     Recommendations  Based on my evaluation the patient does not appear to have an emergency medical condition.  Layla Barter, NP 03/10/22  11:46 AM

## 2022-03-10 NOTE — ED Provider Notes (Addendum)
[facility] Continuous Assessment Admission H&P  Date: 03/10/22 Patient Name: Devin Davis MRN: [id number] Chief Complaint:  Chief Complaint  Patient presents with   Hallucinations      Diagnoses:  Final diagnoses:  Non compliance w medication regimen  Auditory hallucinations  Behavior concern in adult    HPI: Devin Davis, 36 y.o male with a history of paranoid behaviors, substance induced psychotic disorder.  Presented to [facility] voluntarily with his mother.  Per the patient's mother patient has been acting strange, has not slept in 2 to 3 days. Patient stated he is hearing voices telling me to do something that he is not.  When asked if he is taking his medicine patient stated he took Adderall 3 days ago.  Patient lives at home with his mother, according to patient he works at [employer].  Patient is currently on parole for gun charges. According to mother we just need to keep him for a couple of days because she has other stuff she has to do she has her mother to take care of.   Patient appears to be influenced by internal and external stimuli.  Face-to-face observation of patient, patient is alert and oriented x 3-4, speech is clear at times but sometimes a little stutter.  Patient has.  He just blank out as if he does not understand the question.  Mood is depressed and anxious affect is flat.  Patient denies SI, HI.  Patient reports auditory hallucination stating he hears voices telling him to do something that he is not, patient is not a good historian and cannot elaborate on the exact voices are telling him.  Recommend inpatient observation     Flowsheet Row ED from 10/13/2021 in [facility] ED from 08/22/2020 in [facility] EMERGENCY DEPARTMENT  Thoughts that you would be better off dead, or of hurting yourself in some way Several days Several days  PHQ-9 Total Score 9 15       Flowsheet Row Admission (Discharged)  [facility] from 10/13/2021 in [facility] ED from 03/02/2021 in [facility]  C-SSRS RISK CATEGORY No Risk No Risk No Risk        Total Time spent with patient: 20 minutes  Musculoskeletal  Strength & Muscle Tone: within normal limits Gait & Station: normal Patient leans: N/A  Psychiatric Specialty Exam  Presentation General Appearance: Casual  Eye Contact:Good  Speech:Clear and Coherent  Speech Volume:Normal  Handedness:Ambidextrous   Mood and Affect  Mood:Euphoric  Affect:Labile   Thought Process  Thought Processes:Linear  Descriptions of Associations:Loose  Orientation:Full (Time, Place and Person)  Thought Content:Scattered  Diagnosis of Schizophrenia or Schizoaffective disorder in past: No  Duration of Psychotic Symptoms: Less than six months  Hallucinations:Hallucinations: Auditory Description of Auditory Hallucinations: hear voices tell me to do things  Ideas of Reference:None  Suicidal Thoughts:Suicidal Thoughts: No  Homicidal Thoughts:Homicidal Thoughts: No   Sensorium  Memory:Immediate Fair  Judgment:Fair  Insight:Fair   Executive Functions  Concentration:Fair  Attention Span:Fair  Recall:Fair  Fund of Knowledge:Fair  Language:Fair   Psychomotor Activity  Psychomotor Activity:Psychomotor Activity: Normal   Assets  Assets:Desire for Improvement   Sleep  Sleep:Sleep: Poor   Nutritional Assessment (For OBS and FBC admissions only) Has the patient had a weight loss or gain of 10 pounds or more in the last 3 months?: No Has the patient had a decrease in  food intake/or appetite?: No Does the patient have dental problems?: No Does the patient have eating habits or behaviors that may be indicators of an eating disorder including binging or inducing vomiting?: No Has the patient recently lost weight without  trying?: 0 Has the patient been eating poorly because of a decreased appetite?: 0 Malnutrition Screening Tool Score: 0    Physical Exam HENT:     Head: Normocephalic.     Nose: Nose normal.  Cardiovascular:     Rate and Rhythm: Normal rate.  Pulmonary:     Effort: Pulmonary effort is normal.  Musculoskeletal:        General: Normal range of motion.     Cervical back: Normal range of motion.  Skin:    General: Skin is warm.  Neurological:     General: No focal deficit present.     Mental Status: He is alert.  Psychiatric:        Mood and Affect: Mood normal.        Behavior: Behavior normal.        Thought Content: Thought content normal.    Review of Systems  Constitutional: Negative.   HENT: Negative.    Eyes: Negative.   Respiratory: Negative.    Cardiovascular: Negative.   Gastrointestinal: Negative.   Genitourinary: Negative.   Musculoskeletal: Negative.   Skin: Negative.   Neurological: Negative.   Endo/Heme/Allergies: Negative.   Psychiatric/Behavioral:  Positive for hallucinations. The patient is nervous/anxious.     Blood pressure (!) 157/94, pulse 64, temperature 98.6 F (37 C), temperature source Oral, resp. rate 20, SpO2 100 %. There is no height or weight on file to calculate BMI.  Past Psychiatric History: Paranoid ideation, substance induced paranoid disorder  Is the patient at risk to self? No  Has the patient been a risk to self in the past 6 months? No .    Has the patient been a risk to self within the distant past? No   Is the patient a risk to others? No   Has the patient been a risk to others in the past 6 months? No   Has the patient been a risk to others within the distant past? No   Past Medical History:  Past Medical History:  Diagnosis Date   Fracture of metacarpal of right hand, closed 08/04/2010   Comminuted fx @ base  of R 4[facility]   GERD (gastroesophageal reflux disease)    No past surgical history on file.  Family History: No  family history on file.  Social History:  Social History   Socioeconomic History   Marital status: Single    Spouse name: Not on file   Number of children: Not on file   Years of education: Not on file   Highest education level: Not on file  Occupational History   Not on file  Tobacco Use   Smoking status: Every Day    Types: E-cigarettes   Smokeless tobacco: Never  Vaping Use   Vaping Use: Never used  Substance and Sexual Activity   Alcohol use: Yes    Alcohol/week: 4.0 standard drinks of alcohol    Types: 2 Cans of beer, 2 Shots of liquor per week    Comment: occ   Drug use: Not Currently   Sexual activity: Not Currently  Other Topics Concern   Not on file  Social History Narrative   Not on file   Social Determinants of Health   Financial Resource Strain: Not on  file  Food Insecurity: Not on file  Transportation Needs: Not on file  Physical Activity: Not on file  Stress: Not on file  Social Connections: Not on file  Intimate Partner Violence: Not on file    SDOH:  SDOH Screenings   Alcohol Screen: Low Risk  (10/14/2021)  Depression (PHQ2-9): Low Risk  (12/29/2021)  Recent Concern: Depression (PHQ2-9) - Medium Risk (10/13/2021)  Tobacco Use: High Risk (12/29/2021)    Last Labs:  Admission on 03/10/2022, Discharged on 03/10/2022  Component Date Value Ref Range Status   POC Amphetamine UR 03/10/2022 None Detected  NONE DETECTED (Cut Off Level 1000 ng/mL) Preliminary   POC Secobarbital (BAR) 03/10/2022 None Detected  NONE DETECTED (Cut Off Level 300 ng/mL) Preliminary   POC Buprenorphine (BUP) 03/10/2022 Positive (A)  NONE DETECTED (Cut Off Level 10 ng/mL) Preliminary   POC Oxazepam (BZO) 03/10/2022 None Detected  NONE DETECTED (Cut Off Level 300 ng/mL) Preliminary   POC Cocaine UR 03/10/2022 None Detected  NONE DETECTED (Cut Off Level 300 ng/mL) Preliminary   POC Methamphetamine UR 03/10/2022 None Detected  NONE DETECTED (Cut Off Level 1000 ng/mL) Preliminary    POC Morphine 03/10/2022 None Detected  NONE DETECTED (Cut Off Level 300 ng/mL) Preliminary   POC Methadone UR 03/10/2022 None Detected  NONE DETECTED (Cut Off Level 300 ng/mL) Preliminary   POC Oxycodone UR 03/10/2022 None Detected  NONE DETECTED (Cut Off Level 100 ng/mL) Preliminary   POC Marijuana UR 03/10/2022 None Detected  NONE DETECTED (Cut Off Level 50 ng/mL) Preliminary  Admission on 10/14/2021, Discharged on 10/19/2021  Component Date Value Ref Range Status   Sodium 10/15/2021 141  135 - 145 mmol/L Final   Potassium 10/15/2021 3.5  3.5 - 5.1 mmol/L Final   Chloride 10/15/2021 108  98 - 111 mmol/L Final   CO2 10/15/2021 25  22 - 32 mmol/L Final   Glucose, Bld 10/15/2021 170 (H)  70 - 99 mg/dL Final   Glucose reference range applies only to samples taken after fasting for at least 8 hours.   BUN 10/15/2021 11  6 - 20 mg/dL Final   Creatinine, Ser 10/15/2021 0.79  0.61 - 1.24 mg/dL Final   Calcium 57/32/2025 9.2  8.9 - 10.3 mg/dL Final   GFR, Estimated 10/15/2021 >60  >60 mL/min Final   Comment: (NOTE) Calculated using the CKD-EPI Creatinine Equation (2021)    Anion gap 10/15/2021 8  5 - 15 Final   Performed at [facility], [address], [place], [place] [address]  Admission on 10/13/2021, Discharged on 10/14/2021  Component Date Value Ref Range Status   SARS Coronavirus 2 by RT PCR 10/13/2021 NEGATIVE  NEGATIVE Final   Comment: (NOTE) SARS-CoV-2 target nucleic acids are NOT DETECTED.  The SARS-CoV-2 RNA is generally detectable in upper respiratory specimens during the acute phase of infection. The lowest concentration of SARS-CoV-2 viral copies this assay can detect is 138 copies/mL. A negative result does not preclude SARS-Cov-2 infection and should not be used as the sole basis for treatment or other patient management decisions. A negative result may occur with  improper specimen collection/handling, submission of specimen other than nasopharyngeal  swab, presence of viral mutation(s) within the areas targeted by this assay, and inadequate number of viral copies(<138 copies/mL). A negative result must be combined with clinical observations, patient history, and epidemiological information. The expected result is Negative.  Fact Sheet for Patients:  BloggerCourse.com  Fact Sheet for Healthcare Providers:  SeriousBroker.it  This test is  no                          t yet approved or cleared by the [place] and  has been authorized for detection and/or diagnosis of SARS-CoV-2 by FDA under an Emergency Use Authorization (EUA). This EUA will remain  in effect (meaning this test can be used) for the duration of the COVID-19 declaration under Section 564(b)(1) of the Act, 21 U.S.C.section 360bbb-3(b)(1), unless the authorization is terminated  or revoked sooner.       Influenza A by PCR 10/13/2021 NEGATIVE  NEGATIVE Final   Influenza B by PCR 10/13/2021 NEGATIVE  NEGATIVE Final   Comment: (NOTE) The Xpert Xpress SARS-CoV-2/FLU/RSV plus assay is intended as an aid in the diagnosis of influenza from Nasopharyngeal swab specimens and should not be used as a sole basis for treatment. Nasal washings and aspirates are unacceptable for Xpert Xpress SARS-CoV-2/FLU/RSV testing.  Fact Sheet for Patients: EntrepreneurPulse.com.au  Fact Sheet for Healthcare Providers: IncredibleEmployment.be  This test is not yet approved or cleared by the [place] FDA and has been authorized for detection and/or diagnosis of SARS-CoV-2 by FDA under an Emergency Use Authorization (EUA). This EUA will remain in effect (meaning this test can be used) for the duration of the COVID-19 declaration under Section 564(b)(1) of the Act, 21 U.S.C. section 360bbb-3(b)(1), unless the authorization is terminated or revoked.  Performed at [facility], [place] [address], [place], [place] [address]    WBC 10/13/2021 8.7  4.0 - 10.5 K/uL Final   RBC 10/13/2021 5.09  4.22 - 5.81 MIL/uL Final   Hemoglobin 10/13/2021 15.2  13.0 - 17.0 g/dL Final   HCT 10/13/2021 46.1  39.0 - 52.0 % Final   MCV 10/13/2021 90.6  80.0 - 100.0 fL Final   MCH 10/13/2021 29.9  26.0 - 34.0 pg Final   MCHC 10/13/2021 33.0  30.0 - 36.0 g/dL Final   RDW 10/13/2021 12.3  11.5 - 15.5 % Final   Platelets 10/13/2021 405 (H)  150 - 400 K/uL Final   nRBC 10/13/2021 0.0  0.0 - 0.2 % Final   Neutrophils Relative % 10/13/2021 62  % Final   Neutro Abs 10/13/2021 5.5  1.7 - 7.7 K/uL Final   Lymphocytes Relative 10/13/2021 26  % Final   Lymphs Abs 10/13/2021 2.3  0.7 - 4.0 K/uL Final   Monocytes Relative 10/13/2021 6  % Final   Monocytes Absolute 10/13/2021 0.5  0.1 - 1.0 K/uL Final   Eosinophils Relative 10/13/2021 4  % Final   Eosinophils Absolute 10/13/2021 0.3  0.0 - 0.5 K/uL Final   Basophils Relative 10/13/2021 2  % Final   Basophils Absolute 10/13/2021 0.1  0.0 - 0.1 K/uL Final   Immature Granulocytes 10/13/2021 0  % Final   Abs Immature Granulocytes 10/13/2021 0.03  0.00 - 0.07 K/uL Final   Performed at [facility], [place] [address] [place], [place] [address]   Sodium 10/13/2021 141  135 - 145 mmol/L Final   Potassium 10/13/2021 2.8 (L)  3.5 - 5.1 mmol/L Final   Chloride 10/13/2021 105  98 - 111 mmol/L Final   CO2 10/13/2021 27  22 - 32 mmol/L Final   Glucose, Bld 10/13/2021 179 (H)  70 - 99 mg/dL Final   Glucose reference range applies only to samples taken after fasting for at least 8 hours.   BUN 10/13/2021 <5 (L)  6 -  20 mg/dL Final   Creatinine, Ser 10/13/2021 0.83  0.61 - 1.24 mg/dL Final   Calcium 10/13/2021 9.1  8.9 - 10.3 mg/dL Final   Total Protein 10/13/2021 6.5  6.5 - 8.1 g/dL Final   Albumin 10/13/2021 3.9  3.5 - 5.0 g/dL Final   AST 10/13/2021 26  15 - 41 U/L Final   ALT 10/13/2021 19  0 - 44 U/L Final   Alkaline Phosphatase 10/13/2021 56  38 - 126 U/L  Final   Total Bilirubin 10/13/2021 0.4  0.3 - 1.2 mg/dL Final   GFR, Estimated 10/13/2021 >60  >60 mL/min Final   Comment: (NOTE) Calculated using the CKD-EPI Creatinine Equation (2021)    Anion gap 10/13/2021 9  5 - 15 Final   Performed at [facility] [address] [place], [place] [address]   Hgb A1c MFr Bld 10/13/2021 5.6  4.8 - 5.6 % Final   Comment: (NOTE) Pre diabetes:          5.7%-6.4%  Diabetes:              >6.4%  Glycemic control for   <7.0% adults with diabetes    Mean Plasma Glucose 10/13/2021 114.02  mg/dL Final   Performed at [facility], [place] [address] [place], [place] [address]   Cholesterol 10/13/2021 131  0 - 200 mg/dL Final   Triglycerides 10/13/2021 38  <150 mg/dL Final   HDL 10/13/2021 60  >40 mg/dL Final   Total CHOL/HDL Ratio 10/13/2021 2.2  RATIO Final   VLDL 10/13/2021 8  0 - 40 mg/dL Final   LDL Cholesterol 10/13/2021 63  0 - 99 mg/dL Final   Comment:        Total Cholesterol/HDL:CHD Risk Coronary Heart Disease Risk Table                     Men   Women  1/2 Average Risk   3.4   3.3  Average Risk       5.0   4.4  2 X Average Risk   9.6   7.1  3 X Average Risk  23.4   11.0        Use the calculated Patient Ratio above and the CHD Risk Table to determine the patient's CHD Risk.        ATP III CLASSIFICATION (LDL):  <100     mg/dL   Optimal  100-129  mg/dL   Near or Above                    Optimal  130-159  mg/dL   Borderline  160-189  mg/dL   High  >190     mg/dL   Very High Performed at [facility] [address], [place], [place] [address]    Prolactin 10/13/2021 1.8 (L)  4.0 - 15.2 ng/mL Final   Comment: (NOTE) Performed At: [facility] [place], [place] [id number] Rush Farmer MD [id number]    TSH 10/13/2021 0.350  0.350 - 4.500 uIU/mL Final   Comment: Performed by a 3rd Generation assay with a functional sensitivity of <=0.01 uIU/mL. Performed at [facility], [place]  [address] [place], [place] [address]    POC Amphetamine UR 10/13/2021 None Detected  NONE DETECTED (Cut Off Level 1000 ng/mL) Final   POC Secobarbital (BAR) 10/13/2021 None Detected  NONE DETECTED (Cut Off Level 300 ng/mL) Final   POC Buprenorphine (BUP)  10/13/2021 None Detected  NONE DETECTED (Cut Off Level 10 ng/mL) Final   POC Oxazepam (BZO) 10/13/2021 None Detected  NONE DETECTED (Cut Off Level 300 ng/mL) Final   POC Cocaine UR 10/13/2021 None Detected  NONE DETECTED (Cut Off Level 300 ng/mL) Final   POC Methamphetamine UR 10/13/2021 None Detected  NONE DETECTED (Cut Off Level 1000 ng/mL) Final   POC Morphine 10/13/2021 None Detected  NONE DETECTED (Cut Off Level 300 ng/mL) Final   POC Oxycodone UR 10/13/2021 None Detected  NONE DETECTED (Cut Off Level 100 ng/mL) Final   POC Methadone UR 10/13/2021 None Detected  NONE DETECTED (Cut Off Level 300 ng/mL) Final   POC Marijuana UR 10/13/2021 None Detected  NONE DETECTED (Cut Off Level 50 ng/mL) Final    Allergies: Patient has no known allergies.  PTA Medications: (Not in a hospital admission)   Medical Decision Making  Inpatient observation    Lab Orders         Resp Panel by RT-PCR (Flu A&B, Covid) Anterior Nasal Swab         CBC with Differential/Platelet         Comprehensive metabolic panel         Hemoglobin A1c         Lipid panel         TSH         POCT Urine Drug Screen - (I-Screen)      Meds ordered this encounter  Medications   acetaminophen (TYLENOL) tablet 650 mg   alum & mag hydroxide-simeth (MAALOX/MYLANTA) 200-200-20 MG/5ML suspension 30 mL   magnesium hydroxide (MILK OF MAGNESIA) suspension 30 mL   OLANZapine (ZYPREXA) tablet 5 mg   metoprolol succinate (TOPROL-XL) 24 hr tablet 50 mg   AND Linked Order Group    OLANZapine zydis (ZYPREXA) disintegrating tablet 5 mg    LORazepam (ATIVAN) tablet 1 mg    ziprasidone (GEODON) injection 20 mg     Recommendations  Based on my evaluation the patient does not appear to  have an emergency medical condition.  Sindy Guadeloupe, NP 03/10/22  5:41 AM

## 2022-03-10 NOTE — ED Notes (Signed)
Pt sleeping at present, no distress noted.  Monitoring for safety. 

## 2022-03-11 ENCOUNTER — Other Ambulatory Visit: Payer: Self-pay

## 2022-03-11 ENCOUNTER — Encounter (HOSPITAL_COMMUNITY): Payer: Self-pay | Admitting: Psychiatry

## 2022-03-11 ENCOUNTER — Inpatient Hospital Stay (HOSPITAL_COMMUNITY)
Admission: AD | Admit: 2022-03-11 | Discharge: 2022-03-17 | DRG: 885 | Disposition: A | Discharge status: Home / Self Care | Payer: Federal, State, Local not specified - Other | Attending: Psychiatry | Admitting: Psychiatry

## 2022-03-11 DIAGNOSIS — Z91148 Patient's other noncompliance with medication regimen for other reason: Secondary | ICD-10-CM | POA: Diagnosis not present

## 2022-03-11 DIAGNOSIS — Z79899 Other long term (current) drug therapy: Secondary | ICD-10-CM

## 2022-03-11 DIAGNOSIS — F17290 Nicotine dependence, other tobacco product, uncomplicated: Secondary | ICD-10-CM | POA: Diagnosis present

## 2022-03-11 DIAGNOSIS — K219 Gastro-esophageal reflux disease without esophagitis: Secondary | ICD-10-CM | POA: Diagnosis present

## 2022-03-11 DIAGNOSIS — F419 Anxiety disorder, unspecified: Secondary | ICD-10-CM | POA: Diagnosis present

## 2022-03-11 DIAGNOSIS — F29 Unspecified psychosis not due to a substance or known physiological condition: Secondary | ICD-10-CM | POA: Diagnosis present

## 2022-03-11 DIAGNOSIS — F209 Schizophrenia, unspecified: Secondary | ICD-10-CM | POA: Diagnosis present

## 2022-03-11 DIAGNOSIS — Z818 Family history of other mental and behavioral disorders: Secondary | ICD-10-CM | POA: Diagnosis not present

## 2022-03-11 DIAGNOSIS — Z716 Tobacco abuse counseling: Secondary | ICD-10-CM

## 2022-03-11 MED ORDER — POTASSIUM CHLORIDE CRYS ER 20 MEQ PO TBCR
20.0000 meq | EXTENDED_RELEASE_TABLET | Freq: Once | ORAL | Status: AC
  Administered 2022-03-12: 20 meq via ORAL
  Filled 2022-03-11 (×3): qty 1

## 2022-03-11 MED ORDER — TRAZODONE HCL 50 MG PO TABS
50.0000 mg | ORAL_TABLET | Freq: Every evening | ORAL | Status: DC | PRN
  Administered 2022-03-11 – 2022-03-16 (×5): 50 mg via ORAL
  Filled 2022-03-11 (×3): qty 1
  Filled 2022-03-11: qty 7
  Filled 2022-03-11 (×2): qty 1

## 2022-03-11 MED ORDER — POTASSIUM CHLORIDE CRYS ER 20 MEQ PO TBCR
20.0000 meq | EXTENDED_RELEASE_TABLET | Freq: Two times a day (BID) | ORAL | Status: DC
Start: 2022-03-11 — End: 2022-03-11
  Administered 2022-03-11: 20 meq via ORAL
  Filled 2022-03-11: qty 1

## 2022-03-11 MED ORDER — HYDROXYZINE HCL 25 MG PO TABS
25.0000 mg | ORAL_TABLET | Freq: Three times a day (TID) | ORAL | Status: DC | PRN
  Administered 2022-03-11 – 2022-03-16 (×5): 25 mg via ORAL
  Filled 2022-03-11 (×3): qty 1
  Filled 2022-03-11: qty 10
  Filled 2022-03-11 (×2): qty 1

## 2022-03-11 MED ORDER — MAGNESIUM HYDROXIDE 400 MG/5ML PO SUSP: 30.0000 mL | Freq: Every day | ORAL | Status: DC | PRN

## 2022-03-11 MED ORDER — ACETAMINOPHEN 325 MG PO TABS: 650.0000 mg | ORAL_TABLET | Freq: Four times a day (QID) | ORAL | Status: DC | PRN

## 2022-03-11 MED ORDER — OLANZAPINE 5 MG PO TBDP: 5.0000 mg | ORAL_TABLET | Freq: Two times a day (BID) | ORAL | Status: DC

## 2022-03-11 MED ORDER — OLANZAPINE 5 MG PO TBDP
5.0000 mg | ORAL_TABLET | Freq: Two times a day (BID) | ORAL | Status: DC
Start: 2022-03-11 — End: 2022-03-12
  Administered 2022-03-11 – 2022-03-12 (×2): 5 mg via ORAL
  Filled 2022-03-11 (×7): qty 1

## 2022-03-11 NOTE — Discharge Instructions (Addendum)
Transfer to [facility] 

## 2022-03-11 NOTE — Tx Team (Signed)
Initial Treatment Plan 03/11/2022 5:39 PM Devin Davis [id number]    PATIENT STRESSORS: Other: auditory and visual hallucinations     PATIENT STRENGTHS: Ability for insight  Communication skills  Supportive family/friends    PATIENT IDENTIFIED PROBLEMS: Auditory and visual hallucinations, denies SI/HI                     DISCHARGE CRITERIA:  Ability to meet basic life and health needs Adequate post-discharge living arrangements Improved stabilization in mood, thinking, and/or behavior  PRELIMINARY DISCHARGE PLAN: Return to previous living arrangement  PATIENT/FAMILY INVOLVEMENT: This treatment plan has been presented to and reviewed with the patient, Devin Davis, and/or family member. The patient and family have been given the opportunity to ask questions and make suggestions.  Cherre Blanc, RN 03/11/2022, 5:39 PM

## 2022-03-11 NOTE — Progress Notes (Signed)
The focus of this group is to help patients review their daily goal of treatment and discuss progress on daily workbooks.  Pt attended the evening group but was unable to respond to discussion prompts from the Writer. Upon being spoken to, Devin Davis would stare off without speaking before asking the Writer to repeat the question. Upon repeating the question, he would once again stare off in silence.

## 2022-03-11 NOTE — ED Notes (Signed)
Pt resting quietly in bed.  He had a hard time answering this writers questions this morning.  After certain questions pt has long pauses.  He denies pain, SI, and HI.  When asked if he was seeing thing or hearing things pt paused and did not answer question.  Breathing is even and unlabored.  Will continue to monitor for safety.

## 2022-03-11 NOTE — Progress Notes (Signed)
Care Team updated from [facility] that pt to transfer after 4pm once [facility] coordinates.

## 2022-03-11 NOTE — Progress Notes (Signed)
Care Team updated from [facility] that pt to transfer after 4pm once [facility] coordinates.    Maryjean Ka, MSW, LCSWA 03/11/2022 3:11 PM

## 2022-03-11 NOTE — ED Notes (Signed)
Voluntary Form faxed to [facility]. 

## 2022-03-11 NOTE — Plan of Care (Signed)
  Problem: Education: Goal: Will be free of psychotic symptoms Outcome: Not Progressing Goal: Knowledge of the prescribed therapeutic regimen will improve Outcome: Not Progressing   

## 2022-03-11 NOTE — ED Notes (Signed)
Pt sleeping at present, no distress noted.  Monitoring for safety. 

## 2022-03-11 NOTE — Progress Notes (Signed)
     03/11/22 2136  Psych Admission Type (Psych Patients Only)  Admission Status Voluntary  Psychosocial Assessment  Patient Complaints Agitation;Anxiety;Irritability;Depression  Eye Contact Fair  Facial Expression Sad  Affect Flat  Speech Logical/coherent  Interaction Minimal  Motor Activity Slow  Appearance/Hygiene Unremarkable  Behavior Characteristics Cooperative;Appropriate to situation  Mood Depressed;Anxious;Suspicious  Thought Process  Coherency Blocking  Content Preoccupation  Delusions None reported or observed  Perception Hallucinations  Hallucination Auditory  Judgment Impaired  Confusion Mild  Danger to Self  Current suicidal ideation? Denies  Agreement Not to Harm Self Yes  Description of Agreement verbal  Danger to Others  Danger to Others None reported or observed

## 2022-03-11 NOTE — ED Notes (Signed)
Pt resting quietly with eyes closed.  No pain or discomfort noted/voiced.  Breathing is even and unlabored.  Will continue to monitor for safety.  

## 2022-03-11 NOTE — Plan of Care (Signed)
Patient arrived the [facility] voluntarily from the [facility]. Patient does not forward much information and appears very flat. He denies SI/HI/AVH, but appears to be responding to internal stimuli. Pt is cooperative with admission assessment. Skin assessment revealed no abnormal findings. VSS. Per provider note, pt presented to the [facility] accompanied by his mother to be assessed due to "abnormal behavior." Per mother, pt has not been taking his meds. Pt oriented to unit and given dinner. Will continue q34min check and provide verbal encouragement as needed.   Problem: Activity: Goal: Will verbalize the importance of balancing activity with adequate rest periods Outcome: Progressing   Problem: Education: Goal: Will be free of psychotic symptoms Outcome: Progressing Goal: Knowledge of the prescribed therapeutic regimen will improve Outcome: Progressing   Problem: Coping: Goal: Coping ability will improve Outcome: Progressing Goal: Will verbalize feelings Outcome: Progressing   Problem: Health Behavior/Discharge Planning: Goal: Compliance with prescribed medication regimen will improve Outcome: Progressing   Problem: Nutritional: Goal: Ability to achieve adequate nutritional intake will improve Outcome: Progressing   Problem: Role Relationship: Goal: Ability to communicate needs accurately will improve Outcome: Progressing Goal: Ability to interact with others will improve Outcome: Progressing   Problem: Safety: Goal: Ability to redirect hostility and anger into socially appropriate behaviors will improve Outcome: Progressing Goal: Ability to remain free from injury will improve Outcome: Progressing   Problem: Self-Care: Goal: Ability to participate in self-care as condition permits will improve Outcome: Progressing   Problem: Self-Concept: Goal: Will verbalize positive feelings about self Outcome: Progressing

## 2022-03-11 NOTE — ED Provider Notes (Signed)
FBC/OBS ASAP Discharge Summary  Date and Time: 03/11/2022 8:39 AM  Name: Devin Davis  MRN:  [id number]   Discharge Diagnoses:  Final diagnoses:  Psychosis, unspecified psychosis type ([place])    Subjective: Today, patient reevaluated face-to-face by his provider, and chart reviewed. On evaluation, patient awakens to complete the assessment. He is alert and oriented x 4. However, he continues to display thought blocking and delayed responses. His mood is dysphoric and affect is congruent. He denies suicidal and homicidal ideations. He denies auditory or visual hallucinations at this time. There is no objective evidence that the patient is currently responding to internal or external stimuli active at this time. He reports fair sleep.  He reports a fair appetite. He denies any side effects to taking the Zyprexa. He states that he has not used any illicit drugs for the past 2 or 3 months. UDS is negative.   Stay Summary: Devin Davis is a 36 year old male patient with a past psychiatric history significant for psychosis, substance-induced psychotic disorder, and stimulant use disorder who presents to the [facility] behavioral health urgent care on 03/10/22 voluntary accompanied by his sister Cannon Kettle with a chief complaint of hallucinations. Patient was noted to be responding to internal stimuli at that time. Patient was also seen on early morning on 03/10/22 for similar presentation but requested to leave AMA.   Labs obtained includes CBC, CMP, BAL, UDS, COVID, and EKG. Patient's potassium level 3.2. Patient prescribed on 03/11/22 potassium chloride 20 mEq x 2 doses for hypokalemia. Patient was restarted on olanzapine 5 mg p.o. twice daily for psychosis. Patient has tolerated the medications without any side effects.   Total Time spent with patient: 20 minutes  Past Psychiatric History: History of stimulant use disorder, and substance induced psychotic disorder   Past Medical History:   Past Medical History:  Diagnosis Date   Fracture of metacarpal of right hand, closed 08/04/2010   Comminuted fx @ base  of [facility]   GERD (gastroesophageal reflux disease)    No past surgical history on file. Family History: No family history on file. Family Psychiatric History: Patient's sister states that the patient has a family history of schizophrenia, grandmother, aunts and uncles all have schizophrenia. Social History:  Social History   Substance and Sexual Activity  Alcohol Use Yes   Alcohol/week: 4.0 standard drinks of alcohol   Types: 2 Cans of beer, 2 Shots of liquor per week   Comment: occ     Social History   Substance and Sexual Activity  Drug Use Not Currently    Social History   Socioeconomic History   Marital status: Single    Spouse name: Not on file   Number of children: Not on file   Years of education: Not on file   Highest education level: Not on file  Occupational History   Not on file  Tobacco Use   Smoking status: Every Day    Types: E-cigarettes   Smokeless tobacco: Never  Vaping Use   Vaping Use: Never used  Substance and Sexual Activity   Alcohol use: Yes    Alcohol/week: 4.0 standard drinks of alcohol    Types: 2 Cans of beer, 2 Shots of liquor per week    Comment: occ   Drug use: Not Currently   Sexual activity: Not Currently  Other Topics Concern   Not on file  Social History Narrative   Not on file   Social Determinants of Health  Financial Resource Strain: Not on file  Food Insecurity: Not on file  Transportation Needs: Not on file  Physical Activity: Not on file  Stress: Not on file  Social Connections: Not on file   SDOH:  SDOH Screenings   Alcohol Screen: Low Risk  (10/14/2021)  Depression (PHQ2-9): Low Risk  (12/29/2021)  Recent Concern: Depression (PHQ2-9) - Medium Risk (10/13/2021)  Tobacco Use: High Risk (12/29/2021)    Tobacco Cessation:  N/A, patient does not currently use tobacco products  Current  Medications:  Current Facility-Administered Medications  Medication Dose Route Frequency Provider Last Rate Last Admin   alum & mag hydroxide-simeth (MAALOX/MYLANTA) 200-200-20 MG/5ML suspension 30 mL  30 mL Oral Q4H PRN Eain Mullendore L, NP       hydrOXYzine (ATARAX) tablet 25 mg  25 mg Oral TID PRN Paisyn Guercio L, NP   25 mg at 03/10/22 2113   OLANZapine zydis (ZYPREXA) disintegrating tablet 5 mg  5 mg Oral BID Dennard Vezina L, NP   5 mg at 03/10/22 2113   potassium chloride SA (KLOR-CON M) CR tablet 20 mEq  20 mEq Oral BID Armonte Tortorella L, NP       traZODone (DESYREL) tablet 50 mg  50 mg Oral QHS PRN Almendra Loria L, NP   50 mg at 03/10/22 2113   Current Outpatient Medications  Medication Sig Dispense Refill   OLANZapine zydis (ZYPREXA) 5 MG disintegrating tablet Take 1 tablet (5 mg total) by mouth 2 (two) times daily.      PTA Medications: (Not in a hospital admission)      12/29/2021    9:51 AM 10/13/2021    2:26 PM 08/22/2020    9:50 AM  Depression screen PHQ 2/9  Decreased Interest 0 1 3  Down, Depressed, Hopeless 0 1 2  PHQ - 2 Score 0 2 5  Altered sleeping  2 2  Tired, decreased energy  1 2  Change in appetite  0 0  Feeling bad or failure about yourself   1 2  Trouble concentrating  1 2  Moving slowly or fidgety/restless  1 1  Suicidal thoughts  1 1  PHQ-9 Score  9 15  Difficult doing work/chores  Very difficult Very difficult    Flowsheet Row ED from 03/10/2022 in [facility] (Discharged[facility]13/2021 in [facility]  C-SSRS RISK CATEGORY Error: Question 6 not populated No Risk No Risk       Musculoskeletal  Strength & Muscle Tone: within normal limits Gait & Station: normal Patient leans: N/A  Psychiatric Specialty Exam  Presentation  General Appearance: Appropriate for Environment  Eye Contact:Fair  Speech:Clear and  Coherent  Speech Volume:Decreased  Handedness:Right   Mood and Affect  Mood:Dysphoric  Affect:Congruent   Thought Process  Thought Processes:Linear  Descriptions of Associations:Intact  Orientation:Full (Time, Place and Person)  Thought Content:Logical  Diagnosis of Schizophrenia or Schizoaffective disorder in past: No  Duration of Psychotic Symptoms: Less than six months   Hallucinations:Hallucinations: None Description of Auditory Hallucinations: hear voices tell me to do things  Ideas of Reference:None  Suicidal Thoughts:Suicidal Thoughts: No  Homicidal Thoughts:Homicidal Thoughts: No   Sensorium  Memory:Immediate Fair  Judgment:Intact  Insight:Present   Executive Functions  Concentration:Poor  Attention Span:Poor  Recall:Poor  Fund of Knowledge:Poor  Language:Poor   Psychomotor Activity  Psychomotor Activity:Psychomotor Activity: Normal   Assets  Assets:Communication Skills; Desire for Improvement;  Housing; Social Support; Physical Health   Sleep  Sleep:Sleep: Fair Number of Hours of Sleep: 0   Nutritional Assessment (For OBS and FBC admissions only) Has the patient had a weight loss or gain of 10 pounds or more in the last 3 months?: No Has the patient had a decrease in food intake/or appetite?: No Does the patient have dental problems?: No Does the patient have eating habits or behaviors that may be indicators of an eating disorder including binging or inducing vomiting?: No Has the patient recently lost weight without trying?: 0 Has the patient been eating poorly because of a decreased appetite?: 0 Malnutrition Screening Tool Score: 0    Physical Exam  Physical Exam HENT:     Head: Normocephalic.     Right Ear: External ear normal.     Left Ear: External ear normal.     Nose: Nose normal.  Eyes:     Conjunctiva/sclera: Conjunctivae normal.  Cardiovascular:     Rate and Rhythm: Normal rate.  Pulmonary:     Effort:  Pulmonary effort is normal.  Musculoskeletal:        General: Normal range of motion.     Cervical back: Normal range of motion.  Neurological:     Mental Status: He is alert and oriented to person, place, and time.    Review of Systems  Constitutional: Negative.   HENT: Negative.    Eyes: Negative.   Respiratory: Negative.    Cardiovascular: Negative.   Gastrointestinal: Negative.   Genitourinary: Negative.   Musculoskeletal: Negative.   Skin: Negative.   Neurological: Negative.   Endo/Heme/Allergies: Negative.    Blood pressure 131/86, pulse 97, temperature 98 F (36.7 C), temperature source Oral, resp. rate 18, height 5\' 9"  (1.753 m), weight 176 lb (79.8 kg), SpO2 100 %. Body mass index is 25.99 kg/m.   Plan Of Care/Follow-up recommendations:  Activity:  as tolerated  Disposition: Pt was accepted to [facility] 03/10/22; Bed Assignment 404-1. Attending Physician will be Dr. 05/10/22. Patient is voluntary. EMTALA and admission orders completed.     Phineas Inches, NP 03/11/2022, 8:39 AM

## 2022-03-12 DIAGNOSIS — F209 Schizophrenia, unspecified: Secondary | ICD-10-CM | POA: Diagnosis not present

## 2022-03-12 MED ORDER — PALIPERIDONE ER 6 MG PO TB24
6.0000 mg | ORAL_TABLET | Freq: Every day | ORAL | Status: DC
  Administered 2022-03-12 – 2022-03-17 (×6): 6 mg via ORAL
  Filled 2022-03-12 (×8): qty 1

## 2022-03-12 MED ORDER — POTASSIUM CHLORIDE 20 MEQ PO PACK
40.0000 meq | PACK | Freq: Once | ORAL | Status: DC
  Filled 2022-03-12: qty 2

## 2022-03-12 MED ORDER — BENZTROPINE MESYLATE 1 MG PO TABS: 2.0000 mg | ORAL_TABLET | Freq: Two times a day (BID) | ORAL | Status: DC | PRN

## 2022-03-12 MED ORDER — NICOTINE 7 MG/24HR TD PT24
7.0000 mg | MEDICATED_PATCH | Freq: Every day | TRANSDERMAL | Status: DC
Start: 2022-03-12 — End: 2022-03-16
  Filled 2022-03-12 (×6): qty 1

## 2022-03-12 MED ORDER — POTASSIUM CHLORIDE 20 MEQ PO PACK
20.0000 meq | PACK | Freq: Once | ORAL | Status: AC
Start: 2022-03-12 — End: 2022-03-12
  Administered 2022-03-12: 20 meq via ORAL
  Filled 2022-03-12: qty 1

## 2022-03-12 MED ORDER — BENZTROPINE MESYLATE 1 MG/ML IJ SOLN: 2.0000 mg | Freq: Two times a day (BID) | INTRAMUSCULAR | Status: DC | PRN

## 2022-03-12 NOTE — Progress Notes (Signed)
The patient's positive event for the day is that he is feeling better. He rated his day as a 5 out of 10.

## 2022-03-12 NOTE — Progress Notes (Signed)
Pt did not attend the goals group. 

## 2022-03-12 NOTE — Progress Notes (Signed)
Pt in room most of the shift. Pt endorses auditory hallucinations. Pt present paranoid with thought blocking present. Nurse offered to assist pt with calling his sister and mother per physician recommendation. Pt has not come out of bed to use phone as of yet. Pt denies SI/HI/self harm thoughts. Q 15 minute checks ongoing.

## 2022-03-12 NOTE — BHH Suicide Risk Assessment (Signed)
[facility] Admission Suicide Risk Assessment   Nursing information obtained from:  Patient Demographic factors:  NA Current Mental Status:  NA Loss Factors:  NA Historical Factors:  NA Risk Reduction Factors:  NA  Total Time spent with patient: 1 hour Principal Problem: Schizophrenia (HCC) Diagnosis:  Principal Problem:   Schizophrenia (HCC) Active Problems:   Psychosis (HCC)  Subjective Data:  History of Present Illness:  Devin Davis is a 36 year old male with a past psychiatric history of substance-induced psychotic disorder attributed to stimulant use, previous admission to the [facility] from 4/13 to 4/18.  Patient was discharged on Zyprexa 5 mg.  No other admissions in the medical record.  He presented voluntarily to the [facility] behavioral health urgent care on 9/7 complaining of hallucinations.  He was transferred to the [facility] behavioral health hospital for further treatment on a voluntary basis.   Per chart review, the patient was seen and assessed at the [facility] behavioral urgent care on 9/7.  At this time he is accompanied by his mother.  He was to be admitted to the observation unit but quickly left against medical advice.  The patient came back to the [facility] behavioral urgent care a few hours later accompanied by his sister Marisue Humble who reportedly is visiting from [place].  She reported that the patient has been talking to himself and has not slept in 3 days.  On assessment by the nurse practitioner he was noted to be thought blocking and possibly responding internal stimuli.   On interview and assessment today, the patient exhibits thought blocking and speech latency.  The patient appears to be responding to internal stimuli.  The interview was arduous because the patient takes approximately 10 seconds to respond to each question.  The patient is able to report that he went to the [facility] behavioral urgent care because he was  hearing voices.  He is insistent that he came in on his own, however, documentation states that his sister accompanied him.  The patient reports that the voices are "low" right now.  He says they are "saying nothing right now".  He has minimal and guarded when discussing the voices.  He reports that after his previous admission he took his medication for 1 to 2 weeks, found that it was not working, and then stopped taking it.  He reports some anxiety, for which she is able to give no explanation.  He reports experiencing some depression but is unable to elaborate on other symptoms.  He denies suicidal or homicidal thoughts.  He denies experiencing visual hallucinations.   Regarding social history, the patient is able to relate that he has a GED and currently works at [employer].  He reports that he has 4 children by different mothers.  He states that he does not see his children often.  He has difficulty stating who he lives with but is able to say that he lives in [place] and was raised here.   Patient's sister, [name], at [phone]. No answer, voicemail full.    Patient's mother, [name], [phone]. She reports that the patient gets depressed, takes drugs, and then begins having "trouble". She reports he starts hearing voices then cannot go to work and function. She is asked more about the patient's drug use given that the patient's UDS was negative, and she states that she is actually unsure whether or not he takes drugs. She makes the statement " I never knew my son to take  drugs but maybe that is what's happening".  She says "I hate seeing my son like this, I think he cries because the voices are so bad".    She reports the patient is currently living with his grandmother but will sometimes live with his girlfriend with whom he has children. The grandmother is currently getting hospice care. She reports that she visits frequently to help take care of the grandmother. She reports a  strong family history of schizophrenia, including her own mother (the patient's grandmother) and several of the patient's uncles and aunts. She is given information about following up at the [facility] and long acting injectables, with the hope that the patient will receive an LAI prior to discharge.  Continued Clinical Symptoms:    The "Alcohol Use Disorders Identification Test", Guidelines for Use in Primary Care, Second Edition.  World Science writer [facility]). Score between 0-7:  no or low risk or alcohol related problems. Score between 8-15:  moderate risk of alcohol related problems. Score between 16-19:  high risk of alcohol related problems. Score 20 or above:  warrants further diagnostic evaluation for alcohol dependence and treatment.   CLINICAL FACTORS:   Schizophrenia:   Paranoid or undifferentiated type   Musculoskeletal: Strength & Muscle Tone: within normal limits Gait & Station: normal Patient leans: N/A       Psychiatric Specialty Exam:   Presentation  General Appearance: Appropriate for Environment   Eye Contact:Fair   Speech:Clear and Coherent   Speech Volume:Decreased   Handedness:Right     Mood and Affect  Mood:Dysphoric   Affect:Congruent     Thought Process  Thought Processes: thought blocking Duration of Psychotic Symptoms: Less than six months   Past Diagnosis of Schizophrenia or Psychoactive disorder: No   Descriptions of Associations:Intact   Orientation:Full (Time, Place and Person)   Thought Content: scattered  Hallucinations:Hallucinations: None   Ideas of Reference:None   Suicidal Thoughts:Suicidal Thoughts: No   Homicidal Thoughts:Homicidal Thoughts: No     Sensorium  Memory:Immediate Fair   Judgment:Intact   Insight:Present     Executive Functions  Concentration:Poor   Attention Span:Poor   Recall:Poor   Fund of Knowledge:Poor   Language:Poor     Psychomotor Activity  Psychomotor Activity:Psychomotor  Activity: Normal     Assets  Assets:Communication Skills; Desire for Improvement; Housing; Social Support; Physical Health     Sleep  Sleep:Sleep: Fair       Physical Exam: Physical Exam Constitutional:      Appearance: the patient is not toxic-appearing.  Pulmonary:     Effort: Pulmonary effort is normal.  Neurological:     General: No focal deficit present.     Mental Status: the patient is alert and oriented to person, place, and time.    Review of Systems  Respiratory:  Negative for shortness of breath.   Cardiovascular:  Negative for chest pain.  Gastrointestinal:  Negative for abdominal pain, constipation, diarrhea, nausea and vomiting.  Neurological:  Negative for headaches.  Blood pressure (!) 124/93, pulse 74, temperature 97.9 F (36.6 C), temperature source Oral, resp. rate 16, height 5\' 9"  (1.753 m), weight 73 kg, SpO2 100 %. Body mass index is 23.78 kg/m.   COGNITIVE FEATURES THAT CONTRIBUTE TO RISK:  Loss of executive function    SUICIDE RISK:   Mild:  Patient has no identifiable suicidal thoughts. He has loss of executive function 2/2 to psychosis. Expect this to improve.   PLAN OF CARE:  Safety and Monitoring:             --  Voluntary admission to inpatient psychiatric unit for safety, stabilization and treatment             -- Daily contact with patient to assess and evaluate symptoms and progress in treatment             -- Patient's case to be discussed in multi-disciplinary team meeting             -- Observation Level : q15 minute checks             -- Vital signs:  q12 hours             -- Precautions: suicide, elopement, and assault   2. Psychiatric Diagnoses and Treatment:              -- Discontinue home Zyprexa 5 mg QHS -- Start Invega 6 mg daily for psychosis             -- Plan for LAI Invega before D/C -- The risks/benefits/side-effects/alternatives to this medication were discussed in detail with the patient and time was given for  questions. The patient consents to medication trial.              -- Metabolic profile and EKG monitoring obtained while on an atypical antipsychotic (BMI: 24 Lipid Panel: pending HbgA1c: pending QTc: 400)              -- Encouraged patient to participate in unit milieu and in scheduled group therapies              -- Short Term Goals: Ability to maintain clinical measurements within normal limits will improve, Compliance with prescribed medications will improve, and Ability to identify triggers associated with substance abuse/mental health issues will improve             -- Long Term Goals: Improvement in symptoms so as ready for discharge                3. Medical Issues Being Addressed:              Tobacco Use Disorder             -- Nicotine patch 7mg /24 hours ordered             -- Smoking cessation encouraged             Patient denies any medical problems, denies PTA medications aside from Zyprexa   4. Discharge Planning:              -- Social work and case management to assist with discharge planning and identification of hospital follow-up needs prior to discharge             -- Estimated LOS: 5-7 days             -- Discharge Concerns: Need to establish a safety plan; Medication compliance and effectiveness             -- Discharge Goals: Return home with outpatient referrals for mental health follow-up including medication management/psychotherapy             -- Can follow up at 2nd floor [facility], resident clinic             -- Mother says she will transport patient and make sure he takes medication  I certify that inpatient services furnished can reasonably be expected to improve the patient's condition.   [facility], MD 03/12/2022, 3:30 PM

## 2022-03-12 NOTE — Group Note (Signed)
  BHH/BMU LCSW Group Therapy Note  Date/Time:  03/12/2022   Type of Therapy and Topic:  Group Therapy:  Feelings About Hospitalization  Participation Level:  Did Not Attend   Description of Group This process group involved patients discussing their feelings related to being hospitalized, as well as the benefits they see to being in the hospital.  These feelings and benefits were itemized.  The group then brainstormed specific ways in which they could seek those same benefits when they discharge and return home.  Therapeutic Goals Patient will identify and describe positive and negative feelings related to hospitalization Patient will verbalize benefits of hospitalization to themselves personally Patients will brainstorm together ways they can obtain similar benefits in the outpatient setting, identify barriers to wellness and possible solutions  Summary of Patient Progress:  The patient did not attend group.  Therapeutic Modalities Cognitive Behavioral Therapy Motivational Interviewing    Chevy Sweigert, LCSWA 03/12/2022, 11:06 AM    

## 2022-03-12 NOTE — Progress Notes (Signed)
   Administered PRN Hydroxyzine and Trazodone per [facility] per pt request for anxiety and help with sleep.

## 2022-03-12 NOTE — B[facility] Counselor (Signed)
Adult Comprehensive Assessment  Patient ID: Devin Davis, male   DOB: 23-Sep-1985, 36 y.o.   MRN: [id number]  Information Source: Information source: Patient  Current Stressors:  Patient states their primary concerns and needs for treatment are:: "Get stronger medicine." Patient states their goals for this hospitilization and ongoing recovery are:: "Get stronger medicine." Educational / Learning stressors: Denied stressors Employment / Job issues: Denied stressors Family Relationships: Some Programmer, multimedia / Lack of resources (include bankruptcy): Denied stressors Housing / Lack of housing: Denied stressors Physical health (include injuries & life threatening diseases): Denied stressors Social relationships: "Not being truthful" Substance abuse: Denied stressors Bereavement / Loss: Denied stressors  Living/Environment/Situation:  Living Arrangements: Other relatives Living conditions (as described by patient or guardian): Good Who else lives in the home?: Grandmother How long has patient lived in current situation?: 6 months What is atmosphere in current home: Comfortable  Family History:  Marital status: Single Are you sexually active?: Yes What is your sexual orientation?: heterosexual Does patient have children?: Yes How many children?: 4 How is patient's relationship with their children?: "I have a good relationship with my kids."  They are 12yo, 9yo, 5yo, and 4yo.  Childhood History:  By whom was/is the patient raised?: Mother Description of patient's relationship with caregiver when they were a child: Reports being close to mother, Had some contact with father. Patient's description of current relationship with people who raised him/her: Good with mother; "average" with father How were you disciplined when you got in trouble as a child/adolescent?: Would be grounded. Does patient have siblings?: Yes Number of Siblings: 2 Description of patient's current  relationship with siblings: Good relationship Did patient suffer any verbal/emotional/physical/sexual abuse as a child?: No Has patient ever been sexually abused/assaulted/raped as an adolescent or adult?: No Was the patient ever a victim of a crime or a disaster?: No Witnessed domestic violence?: No Has patient been affected by domestic violence as an adult?: No  Education:  Highest grade of school patient has completed: Some college Currently a Consulting civil engineer?: No Learning disability?: No  Employment/Work Situation:   Employment Situation: Employed Where is Patient Currently Employed?: [employer] Long has Patient Been Employed?: 6 months Are You Satisfied With Your Job?: Yes Do You Work More Than One Job?: Yes Work Stressors: None reported Patient's Job has Been Impacted by Current Illness: Yes Describe how Patient's Job has Been Impacted: Missing work while being at the hospital What is the Longest Time Patient has Held a Job?: 2 years Where was the Patient Employed at that Time?: [employer] Has Patient ever Been in the [employer]?: No  Financial Resources:   Financial resources: Income from employment Does patient have a representative payee or guardian?: No  Alcohol/Substance Abuse:   What has been your use of drugs/alcohol within the last 12 months?: Social drinking but no drugs.  He does occasionally purchase a Xanax from a friend and about 6-8 months ago he abused Adderall.  He has been drinking 10-12 energy drinks daily. Alcohol/Substance Abuse Treatment Hx: Denies past history Has alcohol/substance abuse ever caused legal problems?: No  Social Support System:   Patient's Community Support System: Good Describe Community Support System: mother, father, family Type of faith/religion: Ephriam Knuckles How does patient's faith help to cope with current illness?: prayer  Leisure/Recreation:   Do You Have Hobbies?: Yes Leisure and Hobbies: He likes to play  sports  Strengths/Needs:   What is the patient's perception of their strengths?: "I am smart and active."  Patient states they can use these personal strengths during their treatment to contribute to their recovery: "Working, do something to keep busy." Patient states these barriers may affect/interfere with their treatment: N/A Patient states these barriers may affect their return to the community: N/A Other important information patient would like considered in planning for their treatment: N/A  Discharge Plan:   Currently receiving community mental health services: Yes (From Whom) ([facility] sporadically) Patient states concerns and preferences for aftercare planning are: States he could go to psychiatrist and therapist at [facility]. Patient states they will know when they are safe and ready for discharge when: "When I'm not hearing voices telling me what to do so much." Does patient have access to transportation?: Yes (Likely to be mother) Does patient have financial barriers related to discharge medications?: Yes Patient description of barriers related to discharge medications: No insurance Will patient be returning to same living situation after discharge?: Yes (With grandmother)  Summary/Recommendations:   Summary and Recommendations (to be completed by the evaluator): Patient is a 36yo male who is hospitalized with paranoia and command auditory hallucinations that are worsening.  Throughout Psychosocial Assessment he appeared to be thought blocking due to voices that he stated are very bothersome.  There is a significant presence of Schizophrenia in the family including grandmother and uncles, but he himself has to date not been diagnosed with this.  He denies abusing alcohol or illicit drugs, but does sometimes buy Xanax from a friend and abused Adderall a few months ago.  His sister reports that he consumes a lot of energy drinks, about 10-12 daily.  He denies having any suicidal ideation.  He  lives with grandmother and feels that mother is a good support.  He was previously hospitalized at [facility] in April 2023 and did very little follow-up at [facility].  Patient would benefit from group therapy, medication management, psychoeducation, crisis stabilization, peer support and discharge planning.  At discharge it is recommended that the patient adhere to the established aftercare plan.  Lynnell Chad. 03/12/2022

## 2022-03-12 NOTE — H&P (Signed)
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  Patient was discharged on Zyprexa 5 mg.  No other admissions in the medical record.  He presented voluntarily to the [facility] behavioral health urgent care on 9/7 complaining of hallucinations.  He was transferred to the [facility] behavioral health hospital for further treatment on a voluntary basis.  Per chart review, the patient was seen and assessed at the [facility] behavioral urgent care on 9/7.  At this time he is accompanied by his mother.  He was to be admitted to the observation unit but quickly left against medical advice.  The patient came back to the [facility] behavioral urgent care a few hours later accompanied by his sister Devin Davis who reportedly is visiting from [place].  She reported that the patient has been talking to himself and has not slept in 3 days.  On assessment by the nurse practitioner he was noted to be thought blocking and possibly responding internal stimuli.  On interview and assessment today, the patient exhibits thought blocking and speech latency.  The patient appears to be responding to internal stimuli.  The interview was arduous because the patient takes approximately 10 seconds to respond to each question.  The patient is able to report that he went to the [facility] behavioral urgent care because he was hearing voices.  He is insistent that he came in on his own, however, documentation states that his  sister accompanied him.  The patient reports that the voices are "low" right now.  He says they are "saying nothing right now".  He has minimal and guarded when discussing the voices.  He reports that after his previous admission he took his medication for 1 to 2 weeks, found that it was not working, and then stopped taking it.  He reports some anxiety, for which she is able to give no explanation.  He reports experiencing some depression but is unable to elaborate on other symptoms.  He denies suicidal or homicidal thoughts.  He denies experiencing visual hallucinations.  Regarding social history, the patient is able to relate that he has a GED and currently works at [employer].  He reports that he has 4 children by different mothers.  He states that he does not see his children often.  He has difficulty stating who he lives with but is able to say that he lives in [place] and was raised here.  Patient's sister, Devin Davis, at [phone]. No answer, voicemail full.   Patient's mother, Devin Davis, [phone]. She reports that the patient gets depressed, takes drugs, and then begins having "trouble". She reports he starts hearing voices then cannot go to work and function. She is asked more about the patient's drug use given that the patient's UDS was negative, and she states that she is actually unsure whether or not he takes drugs. She makes the statement " I never knew my son to take drugs but maybe that is what's happening".  She says "I hate seeing my son like this, I think he cries because the voices are so bad".  She reports the patient is currently living with his grandmother but will sometimes live with his girlfriend with whom he has children. The grandmother is currently getting hospice care. She reports that she visits frequently to help take care of the grandmother. She reports a strong family history of schizophrenia, including her own mother (the patient's grandmother) and several of  the patient's uncles and aunts. She is given information about following up at the [facility] and long acting injectables, with the hope that the patient will receive an LAI prior to discharge.   Total Time spent with patient: 1 hour  Past Psychiatric History: as above  Is the patient at risk to self? No.  Has the patient been a risk to self in the past 6 months? No.  Has the patient been a risk to self within the distant past? No.  Is the patient a risk to others? No.  Has the patient been a risk to others in the past 6 months? No.  Has the patient been a risk to others within the distant past? No.   [place] Scale:  Flowsheet Row [facility]) [facility] from 03/10/2022 in [facility] Admission (Discharged) [facility]  C-SSRS RISK CATEGORY No Risk Error: Question 6 not populated No Risk        Prior Inpatient Therapy:   Prior Outpatient Therapy:    Alcohol Screening: 1. How often do you have a drink containing alcohol?: Never 2. How many drinks containing alcohol do you have on a typical day when you are drinking?: 1 or 2 3. How often do you have six or more drinks on one occasion?: Never AUDIT-C Score: 0 Alcohol Brief Interventions/Follow-up: Alcohol education/Brief advice Substance Abuse History in the last 12 months:  Yes.   Consequences of Substance Abuse: Negative Previous Psychotropic Medications: Yes  Psychological Evaluations: Yes  Past Medical History:  Past Medical History:  Diagnosis Date   Fracture of metacarpal of right hand, closed 08/04/2010   Comminuted fx @ base  of R 4th MC   GERD (gastroesophageal reflux disease)    History reviewed. No pertinent surgical history. Family History: History reviewed. No pertinent family history. Family Psychiatric  History: as above Tobacco Screening:   Social History:  Social History    Substance and Sexual Activity  Alcohol Use Yes   Alcohol/week: 4.0 standard drinks of alcohol   Types: 2 Cans of beer, 2 Shots of liquor per week   Comment: occ     Social History   Substance and Sexual Activity  Drug Use Not Currently    Additional Social History: Marital status: Single Are you sexually active?: Yes What is your sexual orientation?: heterosexual Does patient have children?: Yes How many children?: 4 How is patient's relationship with their children?: "I have a good relationship with my kids."  They are 12yo, 9yo, 5yo, and 4yo.                         Allergies:  No Known Allergies Lab Results:  No results found for this or any previous visit (from the past 48 hour(s)).   Blood Alcohol level:  Lab Results  Component Value Date   ETH <10 03/10/2022    Metabolic Disorder Labs:  Lab Results  Component Value Date   HGBA1C 5.6 10/13/2021   MPG 114.02 10/13/2021   Lab Results  Component Value Date   PROLACTIN 1.8 (L) 10/13/2021   Lab Results  Component Value Date   CHOL 131 10/13/2021   TRIG 38 10/13/2021   HDL 60 10/13/2021   CHOLHDL 2.2 10/13/2021   VLDL 8 10/13/2021   LDLCALC 63 10/13/2021    Current Medications: Current Facility-Administered Medications  Medication Dose Route Frequency Provider Last Rate Last Admin   acetaminophen (TYLENOL) tablet 650 mg  650 mg Oral Q6H PRN Sindy Guadeloupe, NP       benztropine (COGENTIN) tablet 2 mg  2 mg Oral BID PRN Carlyn Reichert, MD       Or   benztropine mesylate (COGENTIN) injection 2 mg  2 mg Intramuscular BID PRN Carlyn Reichert, MD       hydrOXYzine (ATARAX) tablet 25 mg  25 mg Oral TID PRN Bobbitt, Shalon E, NP   25 mg at 03/11/22 2101   magnesium hydroxide (MILK OF MAGNESIA) suspension 30 mL  30 mL Oral Daily PRN Sindy Guadeloupe, NP       nicotine (NICODERM CQ - dosed in mg/24 hr) patch 7 mg  7 mg Transdermal Daily Carlyn Reichert, MD       paliperidone (INVEGA) 24 hr tablet 6 mg  6  mg Oral Daily Carlyn Reichert, MD       potassium chloride (KLOR-CON) packet 20 mEq  20 mEq Oral Once Carlyn Reichert, MD       traZODone (DESYREL) tablet 50 mg  50 mg Oral QHS PRN Bobbitt, Shalon E, NP   50 mg at 03/11/22 2101   PTA Medications: Medications Prior to Admission  Medication Sig Dispense Refill Last Dose   OLANZapine zydis (ZYPREXA) 5 MG disintegrating tablet Take 1 tablet (5 mg total) by mouth 2 (two) times daily.       Musculoskeletal: Strength & Muscle Tone: within normal limits Gait & Station: normal Patient leans: N/A    Psychiatric Specialty Exam:  Presentation  General Appearance: Appropriate for Environment  Eye Contact:Fair  Speech:Clear and Coherent  Speech Volume:Decreased  Handedness:Right   Mood and Affect  Mood:Dysphoric  Affect:Congruent   Thought Process  Thought Processes: thought blocking Duration of Psychotic Symptoms: Less than six months  Past Diagnosis of Schizophrenia or Psychoactive disorder: No  Descriptions of Associations:Intact  Orientation:Full (Time, Place and Person)  Thought Content: scattered  Hallucinations:Hallucinations: None  Ideas of Reference:None  Suicidal Thoughts:Suicidal Thoughts: No  Homicidal Thoughts:Homicidal Thoughts: No   Sensorium  Memory:Immediate Fair  Judgment:Intact  Insight:Present   Executive Functions  Concentration:Poor  Attention Span:Poor  Recall:Poor  Fund of Knowledge:Poor  Language:Poor   Psychomotor Activity  Psychomotor Activity:Psychomotor Activity: Normal   Assets  Assets:Communication Skills; Desire for Improvement; Housing; Social Support; Physical Health   Sleep  Sleep:Sleep: Fair    Physical Exam: Physical Exam Constitutional:      Appearance: the patient is not toxic-appearing.  Pulmonary:     Effort: Pulmonary effort is normal.  Neurological:     General: No focal deficit present.     Mental Status: the patient is alert and oriented  to person, place, and time.   Review of Systems  Respiratory:  Negative for shortness of breath.   Cardiovascular:  Negative for chest pain.  Gastrointestinal:  Negative for abdominal pain, constipation, diarrhea, nausea and vomiting.  Neurological:  Negative for headaches.   Blood pressure (!) 124/93, pulse 74, temperature 97.9 F (36.6 C), temperature source Oral, resp. rate 16, height 5\' 9"  (1.753 m), weight 73 kg, SpO2 100 %.  Body mass index is 23.78 kg/m.  Treatment Plan Summary: Daily contact with patient to assess and evaluate symptoms and progress in treatment and Medication management   Physician Treatment Plan for Primary Diagnosis: Schizophrenia (HCC) Long Term Goal(s): Improvement in symptoms so as ready for discharge  Short Term Goals: Ability to identify changes in lifestyle to reduce recurrence of condition will improve, Ability to verbalize feelings will improve, and Ability to demonstrate self-control will improve  Physician Treatment Plan for Secondary Diagnosis: Principal Problem:   Schizophrenia (HCC) Active Problems:   Psychosis (HCC)  Long Term Goal(s): Improvement in symptoms so as ready for discharge  Short Term Goals: Ability to maintain clinical measurements within normal limits will improve, Compliance with prescribed medications will improve, and Ability to identify triggers associated with substance abuse/mental health issues will improve   ASSESSMENT:  Diagnoses / Active Problems: #Schizophrenia  PLAN: Safety and Monitoring:  -- Voluntary admission to inpatient psychiatric unit for safety, stabilization and treatment  -- Daily contact with patient to assess and evaluate symptoms and progress in treatment  -- Patient's case to be discussed in multi-disciplinary team meeting  -- Observation Level : q15 minute checks  -- Vital signs:  q12 hours  -- Precautions: suicide, elopement, and assault  2. Psychiatric Diagnoses and Treatment:   --  Discontinue home Zyprexa 5 mg QHS -- Start Invega 6 mg daily for psychosis  -- Plan for LAI Invega before D/C -- The risks/benefits/side-effects/alternatives to this medication were discussed in detail with the patient and time was given for questions. The patient consents to medication trial.   -- Metabolic profile and EKG monitoring obtained while on an atypical antipsychotic (BMI: 24 Lipid Panel: pending HbgA1c: pending QTc: 400)   -- Encouraged patient to participate in unit milieu and in scheduled group therapies   -- Short Term Goals: Ability to maintain clinical measurements within normal limits will improve, Compliance with prescribed medications will improve, and Ability to identify triggers associated with substance abuse/mental health issues will improve  -- Long Term Goals: Improvement in symptoms so as ready for discharge    3. Medical Issues Being Addressed:   Tobacco Use Disorder  -- Nicotine patch 7mg /24 hours ordered  -- Smoking cessation encouraged  Patient denies any medical problems, denies PTA medications aside from Zyprexa  4. Discharge Planning:   -- Social work and case management to assist with discharge planning and identification of hospital follow-up needs prior to discharge  -- Estimated LOS: 5-7 days  -- Discharge Concerns: Need to establish a safety plan; Medication compliance and effectiveness  -- Discharge Goals: Return home with outpatient referrals for mental health follow-up including medication management/psychotherapy  -- Can follow up at 2nd floor [facility], resident clinic  -- Mother says she will transport patient and make sure he takes medication  I certify that inpatient services furnished can reasonably be expected to improve the patient's condition.     [facility], MD 9/9/20233:17 PM

## 2022-03-13 DIAGNOSIS — F209 Schizophrenia, unspecified: Secondary | ICD-10-CM | POA: Diagnosis not present

## 2022-03-13 LAB — LIPID PANEL
Cholesterol: 153 mg/dL (ref 0–200)
HDL: 53 mg/dL (ref >40)
LDL Cholesterol: 87 mg/dL (ref 0–99)
Total CHOL/HDL Ratio: 2.9 RATIO
Triglycerides: 65 mg/dL (ref <150)
VLDL: 13 mg/dL (ref 0–40)

## 2022-03-13 LAB — TSH: TSH: 0.684 u[IU]/mL (ref 0.350–4.500)

## 2022-03-13 LAB — HEMOGLOBIN A1C
Hgb A1c MFr Bld: 5.8 % — ABNORMAL HIGH (ref 4.8–5.6)
Mean Plasma Glucose: 119.76 mg/dL

## 2022-03-13 LAB — POTASSIUM: Potassium: 3.9 mmol/L (ref 3.5–5.1)

## 2022-03-13 IMAGING — CR DG HAND COMPLETE 3+V*R*
4 series · 4 of 4 positions shown · non-contrast
Comparison: None.

CLINICAL DATA: Fall and pain

EXAM:
RIGHT HAND - COMPLETE 3+ VIEW

[x hand pa right]
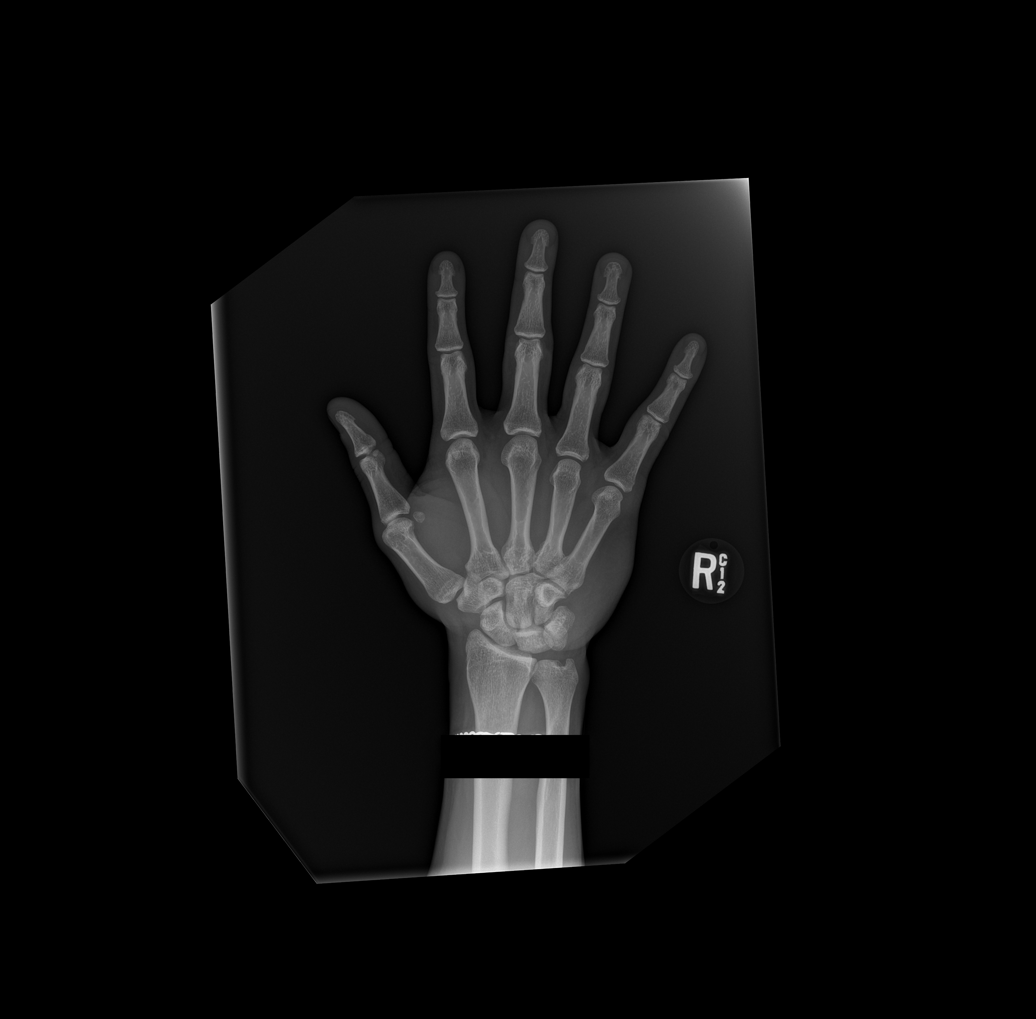

[x hand obl right (1 of 2)]
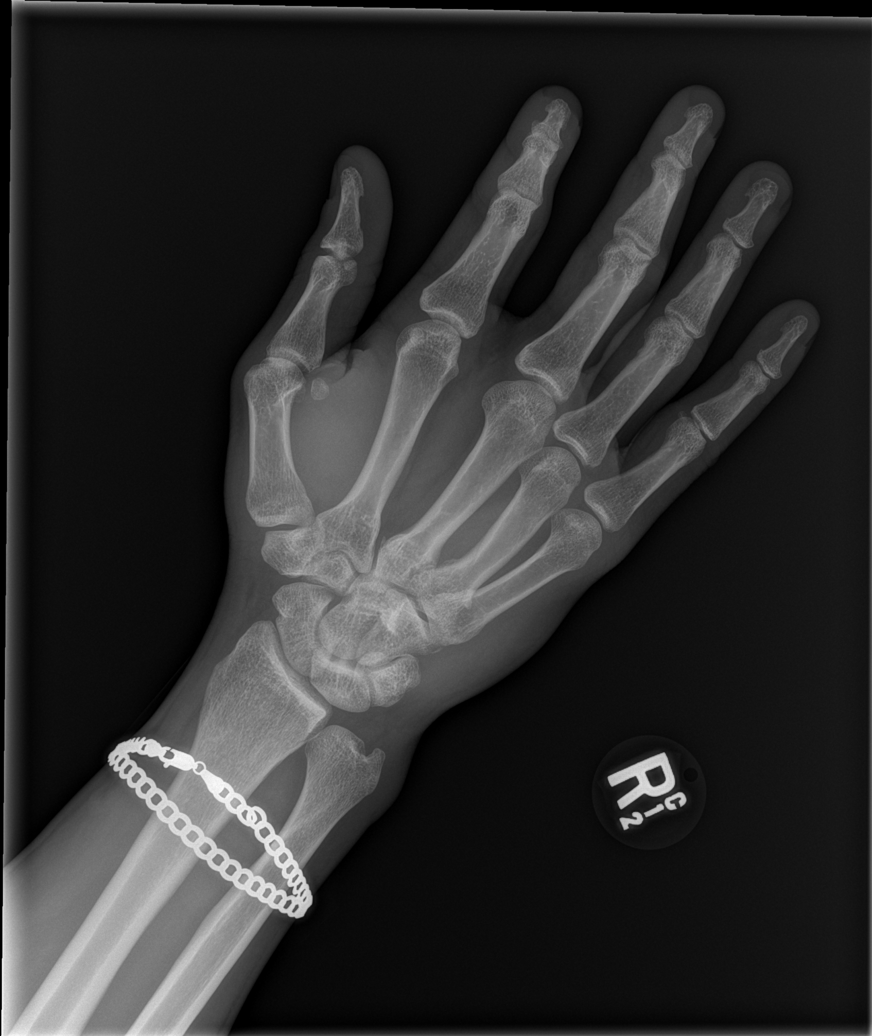

[x hand obl right (2 of 2)]
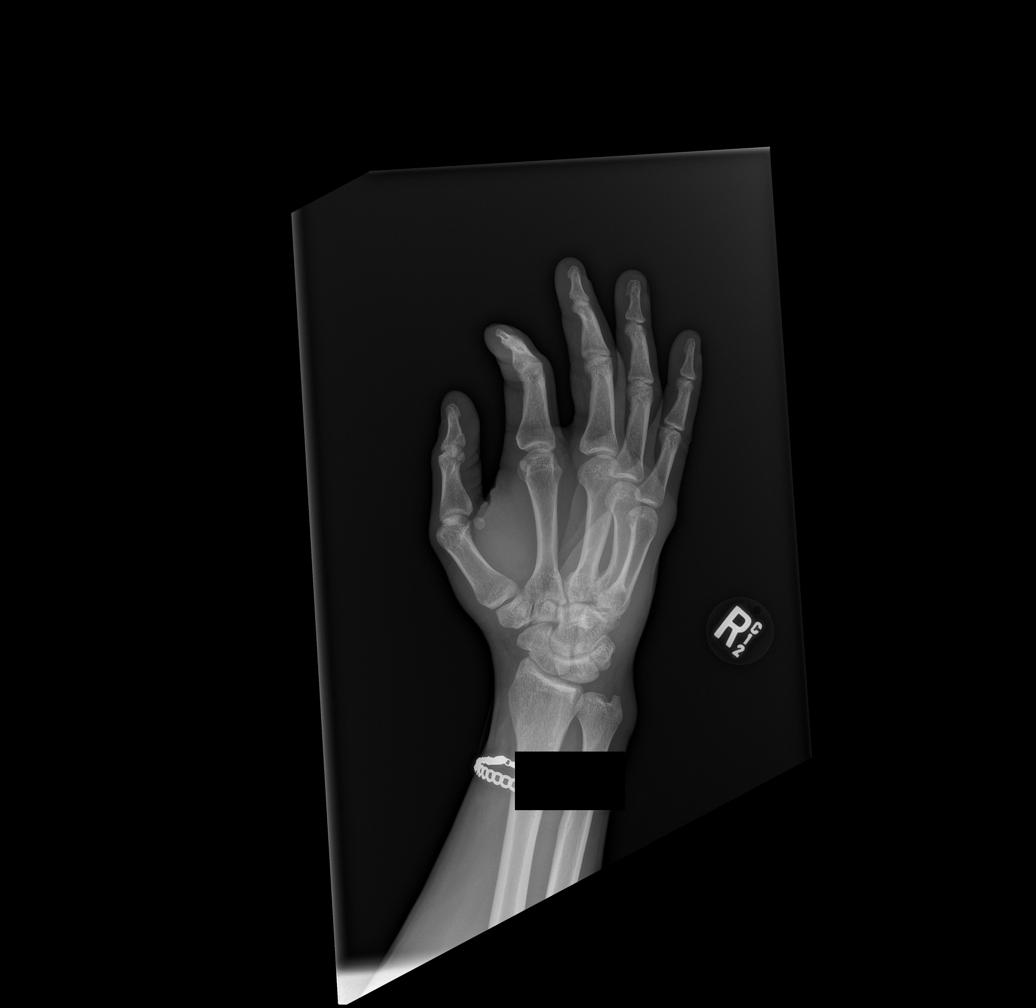

[x hand lat right]
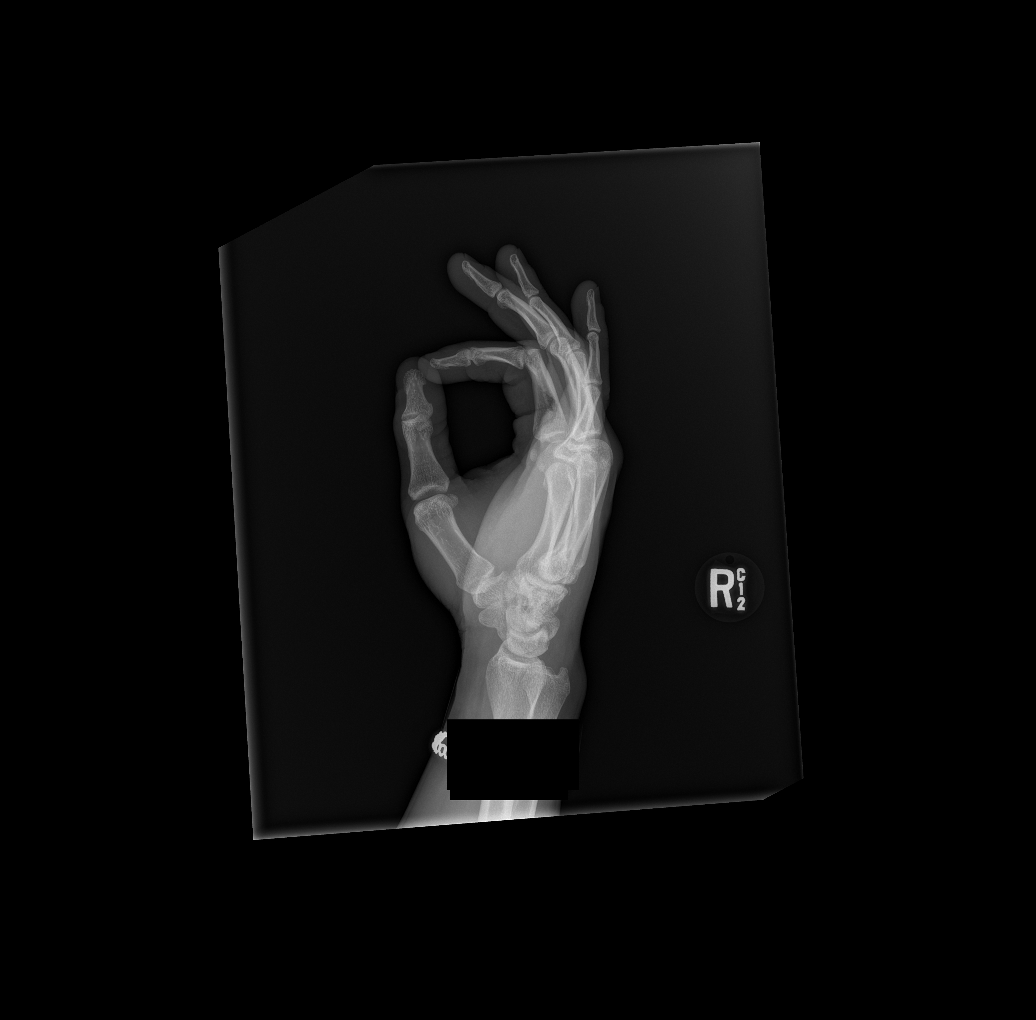

[4 of 4 positions shown; findings below may reference images not displayed]

FINDINGS: There is a tiny mildly displaced osseous fragment seen adjacent to
the dorsal ulnar surface of the hamate, likely small chip fracture.
Overlying soft tissue swelling is seen. No other fractures are
identified.
IMPRESSION: Tiny mildly displaced chip fracture from the dorsal ulnar aspect of
the hamate.

## 2022-03-13 NOTE — Group Note (Signed)
LCSW Group Therapy Note  03/13/2022      Type of Therapy and Topic:  Group Therapy: Gratitude  Participation Level:  Did Not Attend   Description of Group:   In this group, patients shared and discussed the importance of acknowledging the elements in their lives for which they are grateful and how this can positively impact their mood.  The group discussed how bringing the positive elements of their lives to the forefront of their minds can help with recovery from any illness, physical or mental.  An exercise was done as a group in which a list was made of gratitude items in order to encourage participants to consider other potential positives in their lives.  Therapeutic Goals: Patients will identify one or more item for which they are grateful in each of 6 categories:  people, experience, thing, place, skill, and other. Patients will discuss how it is possible to seek out gratitude in even bad situations. Patients will explore other possible items of gratitude that they could remember.   Summary of Patient Progress:  The patient did not attend this group.  Therapeutic Modalities:   Solution-Focused Therapy Activity  Shatha Hooser, LCSWA 10:37 AM  

## 2022-03-13 NOTE — [facility] Suicide Risk Assessment (Signed)
[facility] INPATIENT:  Family/Significant Other Suicide Prevention Education  Suicide Prevention Education:  Education Completed; mother Rennis Harding [phone],  (name of family member/significant other) has been identified by the patient as the family member/significant other with whom the patient will be residing, and identified as the person(s) who will aid the patient in the event of a mental health crisis (suicidal ideations/suicide attempt).    Mother's concern is how the patient will react to the diagnosis of Schizophrenia.  "I don't want him to get into that depressed stage."  Mother is going to check to see if there are weapons in the home or at his sister's home.  He does tend to hide things.   She accepted this information well and said she will be keeping a close eye on him and making sure he takes him medicine.  She wants him to be on an injection monthly because she thinks he would do better with that than taking pills daily.  She stated she will get him to the [facility] for his follow-up.  With written consent from the patient, the family member/significant other has been provided the following suicide prevention education, prior to the and/or following the discharge of the patient.  The suicide prevention education provided includes the following: Suicide risk factors Suicide prevention and interventions National Suicide Hotline telephone number [facility] assessment telephone number [facility] Emergency Assistance 911 [facility] and/or Residential Mobile Crisis Unit telephone number  Request made of family/significant other to: Remove weapons (e.g., guns, rifles, knives), all items previously/currently identified as safety concern.   Remove drugs/medications (over-the-counter, prescriptions, illicit drugs), all items previously/currently identified as a safety concern.  The family member/significant other verbalizes understanding of the suicide prevention education  information provided.  The family member/significant other agrees to remove the items of safety concern listed above.  Carloyn Jaeger Grossman-Orr 03/13/2022, 3:58 PM

## 2022-03-13 NOTE — Progress Notes (Signed)
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  Patient was discharged on Zyprexa 5 mg.  No other admissions in the medical record.  He presented voluntarily to the [facility] behavioral health urgent care on 9/7 complaining of hallucinations.  He was transferred to the [facility] behavioral health hospital for further treatment on a voluntary basis.  Current diagnosis is schizophrenia.  On assessment this morning, the patient exhibits marked improvement compared to yesterday.  The patient has received 2 doses of Invega 6 mg.  He states that going to group was "okay".  He states that he has been sleeping and eating appropriately.  He even smiles during the interview.  He states that he is not currently hearing any voices.  He denies medication side effects.  He denies suicidal thoughts or homicidal thoughts.    Unfortunately, the patient exhibits little insight.  He is able to state that he wants a higher paying job and in the meantime wants to go back to working at [employer].  He feels that the medication has been unhelpful and that he did not come in because anything was wrong with him.  Did not discuss LAI with the patient on this encounter.   Principal Problem: Schizophrenia ([place]) Diagnosis: Principal Problem:   Schizophrenia ([place]) Active Problems:   Psychosis ([place])  Total Time spent with patient: 15 minutes  Past Psychiatric History: as above  Past Medical History:  Past Medical History:  Diagnosis Date   Fracture of metacarpal of right hand, closed 08/04/2010   Comminuted fx @ base  of R 4[facility]   GERD (gastroesophageal reflux disease)    History reviewed. No pertinent surgical history. Family History: History reviewed. No  pertinent family history. Family Psychiatric  History: per H and P, family Hx of schizophrenia Social History:  Social History   Substance and Sexual Activity  Alcohol Use Yes   Alcohol/week: 4.0 standard drinks of alcohol   Types: 2 Cans of beer, 2 Shots of liquor per week   Comment: occ     Social History   Substance and Sexual Activity  Drug Use Not Currently    Social History   Socioeconomic History   Marital status: Single    Spouse name: Not on file   Number of children: Not on file   Years of education: Not on file   Highest education level: Not on file  Occupational History   Not on file  Tobacco Use   Smoking status: Every Day    Types: E-cigarettes   Smokeless tobacco: Never  Vaping Use   Vaping Use: Never used  Substance and Sexual Activity   Alcohol use: Yes    Alcohol/week: 4.0 standard drinks of alcohol    Types: 2 Cans of beer, 2 Shots of liquor per week    Comment: occ   Drug use: Not Currently   Sexual activity: Not Currently  Other Topics Concern   Not on file  Social History Narrative   Not on file   Social Determinants of Health   Financial Resource Strain: Not on file  Food Insecurity: Not on file  Transportation Needs: Unknown (03/11/2022)   PRAPARE - Transportation    Lack of Transportation (Medical): Patient refused    Lack of  Transportation (Non-Medical): Not on file  Physical Activity: Not on file  Stress: Not on file  Social Connections: Not on file   Additional Social History:        Sleep: Fair  Appetite:  Good  Current Medications: Current Facility-Administered Medications  Medication Dose Route Frequency Provider Last Rate Last Admin   acetaminophen (TYLENOL) tablet 650 mg  650 mg Oral Q6H PRN Sindy Guadeloupe, NP       benztropine (COGENTIN) tablet 2 mg  2 mg Oral BID PRN Carlyn Reichert, MD       Or   benztropine mesylate (COGENTIN) injection 2 mg  2 mg Intramuscular BID PRN Carlyn Reichert, MD       hydrOXYzine  (ATARAX) tablet 25 mg  25 mg Oral TID PRN Bobbitt, Shalon E, NP   25 mg at 03/12/22 2109   magnesium hydroxide (MILK OF MAGNESIA) suspension 30 mL  30 mL Oral Daily PRN Sindy Guadeloupe, NP       nicotine (NICODERM CQ - dosed in mg/24 hr) patch 7 mg  7 mg Transdermal Daily Carlyn Reichert, MD       paliperidone (INVEGA) 24 hr tablet 6 mg  6 mg Oral Daily Carlyn Reichert, MD   6 mg at 03/13/22 0757   traZODone (DESYREL) tablet 50 mg  50 mg Oral QHS PRN Bobbitt, Shalon E, NP   50 mg at 03/12/22 2110    Lab Results:  Results for orders placed or performed during the hospital encounter of 03/11/22 (from the past 48 hour(s))  TSH     Status: None   Collection Time: 03/13/22  6:44 AM  Result Value Ref Range   TSH 0.684 0.350 - 4.500 uIU/mL    Comment: Performed by a 3rd Generation assay with a functional sensitivity of <=0.01 uIU/mL. Performed at [facility], [address] [place], [place] [address]   Lipid panel     Status: None   Collection Time: 03/13/22  6:44 AM  Result Value Ref Range   Cholesterol 153 0 - 200 mg/dL   Triglycerides 65 <673 mg/dL   HDL 53 >41 mg/dL   Total CHOL/HDL Ratio 2.9 RATIO   VLDL 13 0 - 40 mg/dL   LDL Cholesterol 87 0 - 99 mg/dL    Comment:        Total Cholesterol/HDL:CHD Risk Coronary Heart Disease Risk Table                     Men   Women  1/2 Average Risk   3.4   3.3  Average Risk       5.0   4.4  2 X Average Risk   9.6   7.1  3 X Average Risk  23.4   11.0        Use the calculated Patient Ratio above and the CHD Risk Table to determine the patient's CHD Risk.        ATP III CLASSIFICATION (LDL):  <100     mg/dL   Optimal  937-902  mg/dL   Near or Above                    Optimal  130-159  mg/dL   Borderline  409-735  mg/dL   High  >329     mg/dL   Very High Performed at [facility] [address] [place], [place] [address]   Hemoglobin A1c     Status: Abnormal  Collection Time: 03/13/22  6:44 AM   Result Value Ref Range   Hgb A1c MFr Bld 5.8 (H) 4.8 - 5.6 %    Comment: (NOTE) Pre diabetes:          5.7%-6.4%  Diabetes:              >6.4%  Glycemic control for   <7.0% adults with diabetes    Mean Plasma Glucose 119.76 mg/dL    Comment: Performed at [facility] [address], [place], [place] [address]  Potassium     Status: None   Collection Time: 03/13/22  6:44 AM  Result Value Ref Range   Potassium 3.9 3.5 - 5.1 mmol/L    Comment: Performed at [facility], [place] [address], [place], [place] [address]    Blood Alcohol level:  Lab Results  Component Value Date   ETH <10 [id number]    Metabolic Disorder Labs: Lab Results  Component Value Date   HGBA1C 5.8 (H) 03/13/2022   MPG 119.76 03/13/2022   MPG 114.02 10/13/2021   Lab Results  Component Value Date   PROLACTIN 1.8 (L) 10/13/2021   Lab Results  Component Value Date   CHOL 153 03/13/2022   TRIG 65 03/13/2022   HDL 53 03/13/2022   CHOLHDL 2.9 03/13/2022   VLDL 13 03/13/2022   LDLCALC 87 03/13/2022   LDLCALC 63 10/13/2021    Physical Findings:   Musculoskeletal: Strength & Muscle Tone: within normal limits Gait & Station: normal Patient leans: N/A  Psychiatric Specialty Exam:  Presentation  General Appearance: Appropriate for Environment  Eye Contact:Fair  Speech:Clear and Coherent  Speech Volume:Decreased  Handedness:Right   Mood and Affect  Mood: euthymic Affect: constricted, approaching normality  Thought Process  Thought Processes:Linear  Descriptions of Associations:Intact  Orientation:Full (Time, Place and Person)  Thought Content:Logical  History of Schizophrenia/Schizoaffective disorder:No  Duration of Psychotic Symptoms:Less than six months  Hallucinations: denies Ideas of Reference:None  Suicidal Thoughts: denies Homicidal Thoughts: denies  Sensorium  Memory:Immediate Fair  Judgment:Intact  Insight:Present   Executive  Functions  Concentration:Poor  Attention Span:Poor  Recall:Poor  Fund of Knowledge:Poor  Language:Poor   Psychomotor Activity  Psychomotor Activity: normal Assets  Assets:Communication Skills; Desire for Improvement; Housing; Social Support; Physical Health   Sleep  Sleep: fair  Physical Exam: Physical Exam Constitutional:      Appearance: the patient is not toxic-appearing.  Pulmonary:     Effort: Pulmonary effort is normal.  Neurological:     General: No focal deficit present.     Mental Status: the patient is alert and oriented to person, place, and time.   Review of Systems  Respiratory:  Negative for shortness of breath.   Cardiovascular:  Negative for chest pain.  Gastrointestinal:  Negative for abdominal pain, constipation, diarrhea, nausea and vomiting.  Neurological:  Negative for headaches.   Blood pressure 114/80, pulse (!) 107, temperature 97.8 F (36.6 C), temperature source Oral, resp. rate 16, height 5\' 9"  (1.753 m), weight 73 kg, SpO2 100 %. Body mass index is 23.78 kg/m.   Treatment Plan SummaDaily contact with patient to assess and evaluate symptoms and progress in treatment and Medication management: Daily contact with patient to assess and evaluate symptoms and progress in treatment and Medication management  Diagnoses / Active Problems: #Schizophrenia   PLAN: Safety and Monitoring:             -- Voluntary admission to inpatient psychiatric unit for safety, stabilization and treatment             --  Daily contact with patient to assess and evaluate symptoms and progress in treatment             -- Patient's case to be discussed in multi-disciplinary team meeting             -- Observation Level : q15 minute checks             -- Vital signs:  q12 hours             -- Precautions: suicide, elopement, and assault   2. Psychiatric Diagnoses and Treatment:              -- Discontinued home Zyprexa 5 mg QHS -- Continue Invega 6 mg daily for  psychosis             -- Plan for LAI Invega before D/C -- The risks/benefits/side-effects/alternatives to this medication were discussed in detail with the patient and time was given for questions. The patient consents to medication trial.              -- Metabolic profile and EKG monitoring obtained while on an atypical antipsychotic (BMI: 24 Lipid Panel: pending HbgA1c: pending QTc: 400)              -- Encouraged patient to participate in unit milieu and in scheduled group therapies              -- Short Term Goals: Ability to maintain clinical measurements within normal limits will improve, Compliance with prescribed medications will improve, and Ability to identify triggers associated with substance abuse/mental health issues will improve             -- Long Term Goals: Improvement in symptoms so as ready for discharge                3. Medical Issues Being Addressed:              Tobacco Use Disorder             -- Nicotine patch 7mg /24 hours ordered             -- Smoking cessation encouraged             Patient denies any medical problems, denies PTA medications aside from Zyprexa   4. Discharge Planning:              -- Social work and case management to assist with discharge planning and identification of hospital follow-up needs prior to discharge             -- Estimated LOS: 5-7 days             -- Discharge Concerns: Need to establish a safety plan; Medication compliance and effectiveness             -- Discharge Goals: Return home with outpatient referrals for mental health follow-up including medication management/psychotherapy             -- Can follow up at 2nd floor [facility], LAI or resident clinic             -- Mother says she will transport patient and make sure he takes medication   12-21-1981, MD 03/13/2022, 3:47 PM

## 2022-03-13 NOTE — Progress Notes (Signed)
   03/12/22 2200  Psych Admission Type (Psych Patients Only)  Admission Status Voluntary  Psychosocial Assessment  Patient Complaints Anxiety  Eye Contact Fair  Facial Expression Animated  Affect Flat;Depressed  Speech Logical/coherent  Interaction Cautious;Minimal  Motor Activity Slow  Appearance/Hygiene Unremarkable  Behavior Characteristics Appropriate to situation  Mood Apprehensive  Thought Process  Coherency Blocking  Content Preoccupation  Delusions WDL  Perception WDL  Hallucination None reported or observed  Judgment Poor  Confusion None  Danger to Self  Current suicidal ideation? Denies  Agreement Not to Harm Self Yes  Description of Agreement VERBAL  Danger to Others  Danger to Others None reported or observed

## 2022-03-13 NOTE — Progress Notes (Signed)
D:  Patient has been laying in bed most of the day.  Patient denied SI and HI, contracts for safety.  Denied A/V hallucinations. A:  Medications administered per MD orders.  Emotional support and encouragement given patient. R:  Safety maintained with 15 minute checks.

## 2022-03-13 NOTE — Progress Notes (Signed)
Nurse discussed anxiety, depression and coping skills with patient.  

## 2022-03-13 NOTE — [facility] Group Notes (Signed)
Patient did not attend the goals group. 

## 2022-03-13 NOTE — Progress Notes (Signed)
RN asked patient to call his family today per MD instructions.  Patient has been laying in bed most of the morning.  Would not get out of bed this morning for group.

## 2022-03-14 ENCOUNTER — Encounter (HOSPITAL_COMMUNITY): Payer: Self-pay

## 2022-03-14 DIAGNOSIS — F209 Schizophrenia, unspecified: Secondary | ICD-10-CM | POA: Diagnosis not present

## 2022-03-14 MED ORDER — PALIPERIDONE PALMITATE ER 234 MG/1.5ML IM SUSY
234.0000 mg | PREFILLED_SYRINGE | Freq: Once | INTRAMUSCULAR | Status: AC
Start: 2022-03-14 — End: 2022-03-14
  Administered 2022-03-14: 234 mg via INTRAMUSCULAR

## 2022-03-14 NOTE — Progress Notes (Signed)
The focus of this group is to help patients review their daily goal of treatment and discuss progress on daily workbooks.  Pt attended the evening group but responded minimally to discussion prompts from the Writer. Pt shared that today was a good day on the unit, the highlight of which was having more energy. "I'm not sure why, I just felt better."  In discussing medium-term goals, Pt told that he wanted to be well enough to get back to work so that he can earn a paycheck and provide for himself.  Pt rated his day a 10 out of 10 and his affect was appropriate.

## 2022-03-14 NOTE — Group Note (Signed)
LCSW Group Therapy Note   Group Date: 03/14/2022 Start Time: 1300 End Time: 1400  Type of Therapy/Topic:  Group Therapy:  Balance in Life   Participation Level:  Did Not Attend   Description of Group:    This group will address the concept of balance and how it feels and looks when one is unbalanced. Patients will be encouraged to process areas in their lives that are out of balance and identify reasons for remaining unbalanced. Facilitators will guide patients in utilizing problem-solving interventions to address and correct the stressor making their life unbalanced. Understanding and applying boundaries will be explored and addressed for obtaining and maintaining a balanced life. Patients will be encouraged to explore ways to assertively make their unbalanced needs known to significant others in their lives, using other group members and facilitator for support and feedback.   Therapeutic Goals: Patient will identify two or more emotions or situations they have that consume much of in their lives. Patient will identify two ways to set boundaries in order to achieve balance in their lives:  Identify wants, needs, and ways to incorporate self-care and coping skills into their daily life.   Summary of Patient Progress:  Did not attend       Therapeutic Modalities:   Cognitive Behavioral Therapy Solution-Focused Therapy Assertiveness Training  [name], [place] 03/14/2022  1:50 PM

## 2022-03-14 NOTE — Progress Notes (Signed)
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  Patient was discharged on Zyprexa 5 mg.  No other admissions in the medical record.  He presented voluntarily to the [facility] behavioral health urgent care on 9/7 complaining of hallucinations.  He was transferred to the [facility] behavioral health hospital for further treatment on a voluntary basis.  Current diagnosis is schizophrenia.  On assessment this morning, the patient continues to do well.  Denies SI/HI/VH.  Patient reports that he has auditory hallucinations early in the morning but they are quieter and not as persistent as compared to when he initially was admitted.  Patient continues to be amenable to getting loaded with Gean Birchwood with plan to get second dose on 03/17/2022 and discharged if doing well.  Understands the benefits of being on LAI as to reduce likelihood of medication noncompliance and medication burden.  Principal Problem: Schizophrenia (HCC) Diagnosis: Principal Problem:   Schizophrenia (HCC) Active Problems:   Psychosis (HCC)  Total Time spent with patient: 15 minutes  Past Psychiatric History: as above  Past Medical History:  Past Medical History:  Diagnosis Date   Fracture of metacarpal of right hand, closed 08/04/2010   Comminuted fx @ base  of R 4th [facility]   GERD (gastroesophageal reflux disease)    History reviewed. No pertinent surgical history. Family History: History reviewed. No pertinent family history. Family Psychiatric  History: per H and P, family Hx of schizophrenia Social History:  Social History   Substance and Sexual Activity  Alcohol Use Yes   Alcohol/week: 4.0 standard drinks of alcohol   Types: 2 Cans of beer, 2 Shots of  liquor per week   Comment: occ     Social History   Substance and Sexual Activity  Drug Use Not Currently    Social History   Socioeconomic History   Marital status: Single    Spouse name: Not on file   Number of children: Not on file   Years of education: Not on file   Highest education level: Not on file  Occupational History   Not on file  Tobacco Use   Smoking status: Every Day    Types: E-cigarettes   Smokeless tobacco: Never  Vaping Use   Vaping Use: Never used  Substance and Sexual Activity   Alcohol use: Yes    Alcohol/week: 4.0 standard drinks of alcohol    Types: 2 Cans of beer, 2 Shots of liquor per week    Comment: occ   Drug use: Not Currently   Sexual activity: Not Currently  Other Topics Concern   Not on file  Social History Narrative   Not on file   Social Determinants of Health   Financial Resource Strain: Not on file  Food Insecurity: Not on file  Transportation Needs: Unknown (03/11/2022)   PRAPARE - Administrator, Civil Service (Medical): Patient refused    Lack of Transportation (Non-Medical): Not on file  Physical Activity: Not on file  Stress: Not on file  Social Connections: Not on file   Additional Social History:        Sleep: Fair  Appetite:  Good  Current Medications: Current Facility-Administered Medications  Medication Dose Route Frequency Provider Last Rate Last Admin   acetaminophen (TYLENOL)  tablet 650 mg  650 mg Oral Q6H PRN Sindy Guadeloupe, NP       benztropine (COGENTIN) tablet 2 mg  2 mg Oral BID PRN Carlyn Reichert, MD       Or   benztropine mesylate (COGENTIN) injection 2 mg  2 mg Intramuscular BID PRN Carlyn Reichert, MD       hydrOXYzine (ATARAX) tablet 25 mg  25 mg Oral TID PRN Bobbitt, Shalon E, NP   25 mg at 03/12/22 2109   magnesium hydroxide (MILK OF MAGNESIA) suspension 30 mL  30 mL Oral Daily PRN Sindy Guadeloupe, NP       nicotine (NICODERM CQ - dosed in mg/24 hr) patch 7 mg  7 mg Transdermal Daily  Carlyn Reichert, MD       paliperidone (INVEGA) 24 hr tablet 6 mg  6 mg Oral Daily Carlyn Reichert, MD   6 mg at 03/13/22 0757   traZODone (DESYREL) tablet 50 mg  50 mg Oral QHS PRN Bobbitt, Shalon E, NP   50 mg at 03/12/22 2110    Lab Results:  Results for orders placed or performed during the hospital encounter of 03/11/22 (from the past 48 hour(s))  TSH     Status: None   Collection Time: 03/13/22  6:44 AM  Result Value Ref Range   TSH 0.684 0.350 - 4.500 uIU/mL    Comment: Performed by a 3rd Generation assay with a functional sensitivity of <=0.01 uIU/mL. Performed at [facility], [address] [place], [place] [address]   Lipid panel     Status: None   Collection Time: 03/13/22  6:44 AM  Result Value Ref Range   Cholesterol 153 0 - 200 mg/dL   Triglycerides 65 <341 mg/dL   HDL 53 >96 mg/dL   Total CHOL/HDL Ratio 2.9 RATIO   VLDL 13 0 - 40 mg/dL   LDL Cholesterol 87 0 - 99 mg/dL    Comment:        Total Cholesterol/HDL:CHD Risk Coronary Heart Disease Risk Table                     Men   Women  1/2 Average Risk   3.4   3.3  Average Risk       5.0   4.4  2 X Average Risk   9.6   7.1  3 X Average Risk  23.4   11.0        Use the calculated Patient Ratio above and the CHD Risk Table to determine the patient's CHD Risk.        ATP III CLASSIFICATION (LDL):  <100     mg/dL   Optimal  222-979  mg/dL   Near or Above                    Optimal  130-159  mg/dL   Borderline  892-119  mg/dL   High  >417     mg/dL   Very High Performed at [facility], [address] [place], [place] [address]   Hemoglobin A1c     Status: Abnormal   Collection Time: 03/13/22  6:44 AM  Result Value Ref Range   Hgb A1c MFr Bld 5.8 (H) 4.8 - 5.6 %    Comment: (NOTE) Pre diabetes:          5.7%-6.4%  Diabetes:              >6.4%  Glycemic control  for   <7.0% adults with diabetes    Mean Plasma Glucose 119.76 mg/dL    Comment: Performed at [facility], [address], [place], [place] [address]  Potassium     Status: None   Collection Time: 03/13/22  6:44 AM  Result Value Ref Range   Potassium 3.9 3.5 - 5.1 mmol/L    Comment: Performed at [facility], [address], [place], [place] [address]    Blood Alcohol level:  Lab Results  Component Value Date   ETH <10 03/10/2022    Metabolic Disorder Labs: Lab Results  Component Value Date   HGBA1C 5.8 (H) 03/13/2022   MPG 119.76 03/13/2022   MPG 114.02 10/13/2021   Lab Results  Component Value Date   PROLACTIN 1.8 (L) 10/13/2021   Lab Results  Component Value Date   CHOL 153 03/13/2022   TRIG 65 03/13/2022   HDL 53 03/13/2022   CHOLHDL 2.9 03/13/2022   VLDL 13 03/13/2022   LDLCALC 87 03/13/2022   LDLCALC 63 10/13/2021    Physical Findings:   Musculoskeletal: Strength & Muscle Tone: within normal limits Gait & Station: normal Patient leans: N/A  Psychiatric Specialty Exam:  Presentation  General Appearance: Appropriate for Environment  Eye Contact:Fair  Speech:Clear and Coherent  Speech Volume:Decreased  Handedness:Right   Mood and Affect  Mood: euthymic Affect: constricted, approaching normality  Thought Process  Thought Processes:Linear  Descriptions of Associations:Intact  Orientation:Full (Time, Place and Person)  Thought Content:Logical  History of Schizophrenia/Schizoaffective disorder:No  Duration of Psychotic Symptoms:Less than six months  Hallucinations: denies Ideas of Reference:None  Suicidal Thoughts: denies Homicidal Thoughts: denies  Sensorium  Memory:Immediate Fair  Judgment:Intact  Insight:Present   Executive Functions  Concentration:Poor  Attention Span:Poor  Recall:Poor  Fund of Knowledge:Poor  Language:Poor   Psychomotor Activity  Psychomotor Activity: normal Assets  Assets:Communication Skills; Desire for Improvement; Housing; Social Support; Physical  Health   Sleep  Sleep: fair  Physical Exam: Physical Exam Constitutional:      Appearance: the patient is not toxic-appearing.  Pulmonary:     Effort: Pulmonary effort is normal.  Neurological:     General: No focal deficit present.     Mental Status: the patient is alert and oriented to person, place, and time.   Review of Systems  Respiratory:  Negative for shortness of breath.   Cardiovascular:  Negative for chest pain.  Gastrointestinal:  Negative for abdominal pain, constipation, diarrhea, nausea and vomiting.  Neurological:  Negative for headaches.   Blood pressure 128/88, pulse 100, temperature 97.9 F (36.6 C), temperature source Oral, resp. rate 16, height 5\' 9"  (1.753 m), weight 73 kg, SpO2 100 %. Body mass index is 23.78 kg/m.   Treatment Plan SummaDaily contact with patient to assess and evaluate symptoms and progress in treatment and Medication management: Daily contact with patient to assess and evaluate symptoms and progress in treatment and Medication management  Diagnoses / Active Problems: #Schizophrenia   PLAN: Safety and Monitoring:             -- Voluntary admission to inpatient psychiatric unit for safety, stabilization and treatment             -- Daily contact with patient to assess and evaluate symptoms and progress in treatment             -- Patient's case to be discussed in multi-disciplinary team meeting             --  Observation Level : q15 minute checks             -- Vital signs:  q12 hours             -- Precautions: suicide, elopement, and assault   2. Psychiatric Diagnoses and Treatment:  -- Continue Invega 6 mg daily for psychosis --Invega Sustenna loading dose today 03/14/22 --Day 4 Invega LAI on 03/17/22 if continues to do well -- The risks/benefits/side-effects/alternatives to this medication were discussed in detail with the patient and time was given for questions. The patient consents to medication trial.              --  Metabolic profile and EKG monitoring obtained while on an atypical antipsychotic (BMI: 24 Lipid Panel: pending HbgA1c: pending QTc: 400)              -- Encouraged patient to participate in unit milieu and in scheduled group therapies              -- Short Term Goals: Ability to maintain clinical measurements within normal limits will improve, Compliance with prescribed medications will improve, and Ability to identify triggers associated with substance abuse/mental health issues will improve             -- Long Term Goals: Improvement in symptoms so as ready for discharge                3. Medical Issues Being Addressed:              Tobacco Use Disorder             -- Nicotine patch 7mg /24 hours ordered             -- Smoking cessation encouraged             Patient denies any medical problems, denies PTA medications aside from Zyprexa   4. Discharge Planning:              -- Social work and case management to assist with discharge planning and identification of hospital follow-up needs prior to discharge             -- Estimated LOS: 5-7 days             -- Discharge Concerns: Need to establish a safety plan; Medication compliance and effectiveness             -- Discharge Goals: Return home with outpatient referrals for mental health follow-up including medication management/psychotherapy             -- Can follow up at 2nd floor [facility]I clinic             -- Mother says she will transport patient and make sure he takes medication   [facility], MD 03/14/2022, 7:30 AM

## 2022-03-14 NOTE — Progress Notes (Signed)
   03/13/22 2200  Psych Admission Type (Psych Patients Only)  Admission Status Voluntary  Psychosocial Assessment  Patient Complaints Isolation  Eye Contact Fair  Facial Expression Flat  Affect Flat  Speech Logical/coherent  Interaction Forwards little  Motor Activity Slow  Appearance/Hygiene Unremarkable  Behavior Characteristics Appropriate to situation  Mood Apprehensive  Thought Process  Coherency Blocking  Content Preoccupation  Delusions None reported or observed  Perception WDL  Hallucination None reported or observed  Judgment Poor  Confusion None  Danger to Self  Current suicidal ideation? Denies  Agreement Not to Harm Self Yes  Description of Agreement verbal  Danger to Others  Danger to Others None reported or observed

## 2022-03-14 NOTE — Progress Notes (Signed)
Pt visible in milieu at intervals during shift. Presents little guarded, cautious but does forwards on interactions. Observed with slight thought blocking but presents logical during groups and subsequent interactions. Denies SI, HI, AVH and pain. Confirms he slept fairly last night related to "nightmares, when I have nightmares that means those things are coming or about to happen. It was back to back. I was still able to go back to sleep though". Attended scheduled groups, engaged in activities on and off unit. Remains medication compliant without issues. Invega Sustena 234 mg IM given this shift. Safety checks maintained at Q 15 minutes intervals without outburst. Verbal education done on current regimen and effects monitored. Support, reassurance and encouragement offered. Pt tolerates all meals and fluids well. Remains redirectable and cooperative with care.

## 2022-03-14 NOTE — BH IP Treatment Plan (Signed)
Interdisciplinary Treatment and Diagnostic Plan Update  03/14/2022 Time of Session: 1000 GRAHM ETSITTY MRN: [id number]  Principal Diagnosis: Schizophrenia [facility])  Secondary Diagnoses: Principal Problem:   Schizophrenia (HCC) Active Problems:   Psychosis (HCC)   Current Medications:  Current Facility-Administered Medications  Medication Dose Route Frequency Provider Last Rate Last Admin   acetaminophen (TYLENOL) tablet 650 mg  650 mg Oral Q6H PRN Sindy Guadeloupe, NP       benztropine (COGENTIN) tablet 2 mg  2 mg Oral BID PRN Carlyn Reichert, MD       Or   benztropine mesylate (COGENTIN) injection 2 mg  2 mg Intramuscular BID PRN Carlyn Reichert, MD       hydrOXYzine (ATARAX) tablet 25 mg  25 mg Oral TID PRN Bobbitt, Shalon E, NP   25 mg at 03/12/22 2109   magnesium hydroxide (MILK OF MAGNESIA) suspension 30 mL  30 mL Oral Daily PRN Sindy Guadeloupe, NP       nicotine (NICODERM CQ - dosed in mg/24 hr) patch 7 mg  7 mg Transdermal Daily Carlyn Reichert, MD       paliperidone (INVEGA SUSTENNA) injection 234 mg  234 mg Intramuscular Once Park Pope, MD       paliperidone (INVEGA) 24 hr tablet 6 mg  6 mg Oral Daily Carlyn Reichert, MD   6 mg at 03/14/22 0839   traZODone (DESYREL) tablet 50 mg  50 mg Oral QHS PRN Bobbitt, Shalon E, NP   50 mg at 03/12/22 2110   PTA Medications: Medications Prior to Admission  Medication Sig Dispense Refill Last Dose   OLANZapine zydis (ZYPREXA) 5 MG disintegrating tablet Take 1 tablet (5 mg total) by mouth 2 (two) times daily.       Patient Stressors: Other: auditory and visual hallucinations    Patient Strengths: Ability for insight  Communication skills  Supportive family/friends   Treatment Modalities: Medication Management, Group therapy, Case management,  1 to 1 session with clinician, Psychoeducation, Recreational therapy.   Physician Treatment Plan for Primary Diagnosis: Schizophrenia (HCC) Long Term Goal(s): Improvement in symptoms so as  ready for discharge   Short Term Goals: Ability to maintain clinical measurements within normal limits will improve Compliance with prescribed medications will improve Ability to identify triggers associated with substance abuse/mental health issues will improve Ability to identify changes in lifestyle to reduce recurrence of condition will improve Ability to verbalize feelings will improve Ability to demonstrate self-control will improve  Medication Management: Evaluate patient's response, side effects, and tolerance of medication regimen.  Therapeutic Interventions: 1 to 1 sessions, Unit Group sessions and Medication administration.  Evaluation of Outcomes: Progressing  Physician Treatment Plan for Secondary Diagnosis: Principal Problem:   Schizophrenia (HCC) Active Problems:   Psychosis (HCC)  Long Term Goal(s): Improvement in symptoms so as ready for discharge   Short Term Goals: Ability to maintain clinical measurements within normal limits will improve Compliance with prescribed medications will improve Ability to identify triggers associated with substance abuse/mental health issues will improve Ability to identify changes in lifestyle to reduce recurrence of condition will improve Ability to verbalize feelings will improve Ability to demonstrate self-control will improve     Medication Management: Evaluate patient's response, side effects, and tolerance of medication regimen.  Therapeutic Interventions: 1 to 1 sessions, Unit Group sessions and Medication administration.  Evaluation of Outcomes: Progressing   RN Treatment Plan for Primary Diagnosis: Schizophrenia (HCC) Long Term Goal(s): Knowledge of disease and therapeutic regimen to maintain health will improve  Short Term Goals: Ability to remain free from injury will improve, Ability to verbalize frustration and anger appropriately will improve, Ability to demonstrate self-control, Ability to participate in decision  making will improve, Ability to verbalize feelings will improve, Ability to disclose and discuss suicidal ideas, Ability to identify and develop effective coping behaviors will improve, and Compliance with prescribed medications will improve  Medication Management: RN will administer medications as ordered by provider, will assess and evaluate patient's response and provide education to patient for prescribed medication. RN will report any adverse and/or side effects to prescribing provider.  Therapeutic Interventions: 1 on 1 counseling sessions, Psychoeducation, Medication administration, Evaluate responses to treatment, Monitor vital signs and CBGs as ordered, Perform/monitor CIWA, COWS, AIMS and Fall Risk screenings as ordered, Perform wound care treatments as ordered.  Evaluation of Outcomes: Progressing   LCSW Treatment Plan for Primary Diagnosis: Schizophrenia (HCC) Long Term Goal(s): Safe transition to appropriate next level of care at discharge, Engage patient in therapeutic group addressing interpersonal concerns.  Short Term Goals: Engage patient in aftercare planning with referrals and resources, Increase social support, Increase ability to appropriately verbalize feelings, Increase emotional regulation, Facilitate acceptance of mental health diagnosis and concerns, Facilitate patient progression through stages of change regarding substance use diagnoses and concerns, Identify triggers associated with mental health/substance abuse issues, and Increase skills for wellness and recovery  Therapeutic Interventions: Assess for all discharge needs, 1 to 1 time with Social worker, Explore available resources and support systems, Assess for adequacy in community support network, Educate family and significant other(s) on suicide prevention, Complete Psychosocial Assessment, Interpersonal group therapy.  Evaluation of Outcomes: Progressing   Progress in Treatment: Attending groups:  No. Participating in groups: No. Taking medication as prescribed: Yes. Toleration medication: Yes. Family/Significant other contact made: Yes, individual(s) contacted:  mother Rennis Harding [phone] Patient understands diagnosis: Yes. Discussing patient identified problems/goals with staff: Yes. Medical problems stabilized or resolved: Yes. Denies suicidal/homicidal ideation: No. Issues/concerns per patient self-inventory: Yes. Other: none  New problem(s) identified: No, Describe:  none  New Short Term/Long Term Goals: Patient to work towards elimination of symptoms of psychosis, medication management for mood stabilization; elimination of SI thoughts; development of comprehensive mental wellness plan.  Patient Goals:  Patient states their goal for treatment is to "get help with the voices and stuff but I am pretty good."  Discharge Plan or Barriers: No psychosocial barriers identified at this time, patient to return to place of residence when appropriate for discharge.   Reason for Continuation of Hospitalization: Hallucinations  Estimated Length of Stay: 1-7 days   Last 3 [place] Suicide Severity Risk Score: Flowsheet Row [facility] (Discharged) [facility]  C-SSRS RISK CATEGORY No Risk Error: Question 6 not populated No Risk       Last PHQ 2/9 Scores:    12/29/2021    9:51 AM 10/13/2021    2:26 PM 08/22/2020    9:50 AM  Depression screen PHQ 2/9  Decreased Interest 0 1 3  Down, Depressed, Hopeless 0 1 2  PHQ - 2 Score 0 2 5  Altered sleeping  2 2  Tired, decreased energy  1 2  Change in appetite  0 0  Feeling bad or failure about yourself   1 2  Trouble concentrating  1 2  Moving slowly or fidgety/restless  1 1  Suicidal thoughts  1 1  PHQ-9 Score  9 15  Difficult doing  work/chores  Very difficult Very difficult    Scribe for Treatment Team: Almedia Balls 03/14/2022 11:11 AM

## 2022-03-14 NOTE — BHH Group Notes (Signed)
Group date: 03/14/2022 Group time: 1430  Group topic: Resources and Support systems   Focus of group to help patients identify sources of support and other important resources,   Patient attended group and was attentive. Patient engaged in discussion. Patient identified family and friends as sources of support.

## 2022-03-15 DIAGNOSIS — F209 Schizophrenia, unspecified: Secondary | ICD-10-CM | POA: Diagnosis not present

## 2022-03-15 NOTE — Progress Notes (Signed)
   03/15/22 1200  Psych Admission Type (Psych Patients Only)  Admission Status Voluntary  Psychosocial Assessment  Patient Complaints None  Eye Contact Suspiciousness  Facial Expression Flat  Affect Apprehensive  Speech Logical/coherent  Interaction Forwards little  Motor Activity Slow  Appearance/Hygiene Unremarkable  Behavior Characteristics Appropriate to situation  Mood Preoccupied  Thought Process  Coherency Concrete thinking  Content Preoccupation  Delusions None reported or observed  Perception WDL  Hallucination None reported or observed  Judgment Impaired  Confusion None  Danger to Self  Current suicidal ideation? Denies

## 2022-03-15 NOTE — Progress Notes (Signed)
Pt states he wants to get ready for discharge. Pt rates depression 0/10 and anxiety 0/10. Pt reports a good appetite, and no physical problems. Pt denies SI/HI/AVH and verbally contracts for safety. Provided support and encouragement. Pt safe on the unit. Q 15 minute safety checks continued.

## 2022-03-15 NOTE — Group Note (Signed)
Recreation Therapy Group Note   Group Topic:Team Building  Group Date: 03/15/2022 Start Time: 1000 End Time: 1030 Facilitators: Caroll Rancher, LRT,CTRS Location: 400 Hall Dayroom   Goal Area(s) Addresses:  Patient will effectively work with peer towards shared goal.  Patient will identify skills used to make activity successful.  Patient will identify how skills used during activity can be used to reach post d/c goals.    Group Description: Landing Pad. In teams of 3-5, patients were given 12 plastic drinking straws and an equal length of masking tape. Using the materials provided, patients were asked to build a landing pad to catch a golf ball dropped from approximately 5 feet in the air. All materials were required to be used by the team in their design. LRT facilitated post-activity discussion.   Affect/Mood: N/A   Participation Level: Did not attend    Clinical Observations/Individualized Feedback:     Plan: Continue to engage patient in RT group sessions 2-3x/week.   Caroll Rancher, LRT,CTRS 03/15/2022 1:03 PM

## 2022-03-15 NOTE — BHH Counselor (Signed)
The Pt states that he has a court date on 03/17/2022 in [place]. CSW sent a letter to the [place] [facility] Court [facility] informing them that the Pt will be discharging after 9:30am on that day.  CSW will also provide the Pt with another note on the day of discharge.

## 2022-03-15 NOTE — Progress Notes (Signed)
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  Patient was discharged on Zyprexa 5 mg.  No other admissions in the medical record.  He presented voluntarily to the [facility] behavioral health urgent care on 9/7 complaining of hallucinations.  He was transferred to the [facility] behavioral health hospital for further treatment on a voluntary basis.  Current diagnosis is schizophrenia.  On assessment this morning, the patient continues to do well.  Denies SI/HI/VH.  Patient reports that he has auditory hallucinations early in the morning. Amenable to 2nd loading dose of Invega. Reports he feels better with LAI. Reports amenable to discharge Thursday but is concerned he has court on 9/14.  Understands the benefits of being on LAI as to reduce likelihood of medication noncompliance and medication burden.  Principal Problem: Schizophrenia (HCC) Diagnosis: Principal Problem:   Schizophrenia (HCC) Active Problems:   Psychosis (HCC)  Total Time spent with patient: 15 minutes  Past Psychiatric History: as above  Past Medical History:  Past Medical History:  Diagnosis Date   Fracture of metacarpal of right hand, closed 08/04/2010   Comminuted fx @ base  of [facility]   GERD (gastroesophageal reflux disease)    History reviewed. No pertinent surgical history. Family History: History reviewed. No pertinent family history. Family Psychiatric  History: per H and P, family Hx of schizophrenia Social History:  Social History   Substance and Sexual Activity  Alcohol Use Yes   Alcohol/week: 4.0 standard drinks of alcohol   Types: 2 Cans of beer, 2 Shots of liquor per week   Comment: occ     Social History   Substance and Sexual Activity   Drug Use Not Currently    Social History   Socioeconomic History   Marital status: Single    Spouse name: Not on file   Number of children: Not on file   Years of education: Not on file   Highest education level: Not on file  Occupational History   Not on file  Tobacco Use   Smoking status: Every Day    Types: E-cigarettes   Smokeless tobacco: Never  Vaping Use   Vaping Use: Never used  Substance and Sexual Activity   Alcohol use: Yes    Alcohol/week: 4.0 standard drinks of alcohol    Types: 2 Cans of beer, 2 Shots of liquor per week    Comment: occ   Drug use: Not Currently   Sexual activity: Not Currently  Other Topics Concern   Not on file  Social History Narrative   Not on file   Social Determinants of Health   Financial Resource Strain: Not on file  Food Insecurity: Not on file  Transportation Needs: Unknown (03/11/2022)   PRAPARE - Administrator, Civil Service (Medical): Patient refused    Lack of Transportation (Non-Medical): Not on file  Physical Activity: Not on file  Stress: Not on file  Social Connections: Not on file   Additional Social History:        Sleep: Fair  Appetite:  Good  Current Medications: Current Facility-Administered Medications  Medication Dose Route Frequency Provider Last Rate Last Admin   acetaminophen (TYLENOL) tablet 650 mg  650 mg Oral Q6H PRN Sindy Guadeloupe, NP  benztropine (COGENTIN) tablet 2 mg  2 mg Oral BID PRN Carlyn Reichert, MD       Or   benztropine mesylate (COGENTIN) injection 2 mg  2 mg Intramuscular BID PRN Carlyn Reichert, MD       hydrOXYzine (ATARAX) tablet 25 mg  25 mg Oral TID PRN Bobbitt, Shalon E, NP   25 mg at 03/14/22 2125   magnesium hydroxide (MILK OF MAGNESIA) suspension 30 mL  30 mL Oral Daily PRN Sindy Guadeloupe, NP       nicotine (NICODERM CQ - dosed in mg/24 hr) patch 7 mg  7 mg Transdermal Daily Carlyn Reichert, MD       paliperidone (INVEGA) 24 hr tablet 6 mg  6 mg Oral Daily  Carlyn Reichert, MD   6 mg at 03/14/22 0839   traZODone (DESYREL) tablet 50 mg  50 mg Oral QHS PRN Bobbitt, Shalon E, NP   50 mg at 03/14/22 2125    Lab Results:  No results found for this or any previous visit (from the past 48 hour(s)).   Blood Alcohol level:  Lab Results  Component Value Date   ETH <10 03/10/2022    Metabolic Disorder Labs: Lab Results  Component Value Date   HGBA1C 5.8 (H) 03/13/2022   MPG 119.76 03/13/2022   MPG 114.02 10/13/2021   Lab Results  Component Value Date   PROLACTIN 1.8 (L) 10/13/2021   Lab Results  Component Value Date   CHOL 153 03/13/2022   TRIG 65 03/13/2022   HDL 53 03/13/2022   CHOLHDL 2.9 03/13/2022   VLDL 13 03/13/2022   LDLCALC 87 03/13/2022   LDLCALC 63 10/13/2021    Physical Findings:   Musculoskeletal: Strength & Muscle Tone: within normal limits Gait & Station: normal Patient leans: N/A  Psychiatric Specialty Exam:  Presentation  General Appearance: Appropriate for Environment  Eye Contact:Fair  Speech:Clear and Coherent  Speech Volume:Decreased  Handedness:Right   Mood and Affect  Mood: euthymic Affect: constricted, approaching normality  Thought Process  Thought Processes:Linear  Descriptions of Associations:Intact  Orientation:Full (Time, Place and Person)  Thought Content:Logical  History of Schizophrenia/Schizoaffective disorder:No  Duration of Psychotic Symptoms:Less than six months  Hallucinations: denies Ideas of Reference:None  Suicidal Thoughts: denies Homicidal Thoughts: denies  Sensorium  Memory:Immediate Fair  Judgment:Intact  Insight:Present   Executive Functions  Concentration:Poor  Attention Span:Poor  Recall:Poor  Fund of Knowledge:Poor  Language:Poor   Psychomotor Activity  Psychomotor Activity: normal Assets  Assets:Communication Skills; Desire for Improvement; Housing; Social Support; Physical Health   Sleep  Sleep: fair  Physical  Exam: Physical Exam Constitutional:      Appearance: the patient is not toxic-appearing.  Pulmonary:     Effort: Pulmonary effort is normal.  Neurological:     General: No focal deficit present.     Mental Status: the patient is alert and oriented to person, place, and time.   Review of Systems  Respiratory:  Negative for shortness of breath.   Cardiovascular:  Negative for chest pain.  Gastrointestinal:  Negative for abdominal pain, constipation, diarrhea, nausea and vomiting.  Neurological:  Negative for headaches.   Blood pressure (!) 132/91, pulse 86, temperature 98.6 F (37 C), temperature source Oral, resp. rate 16, height 5\' 9"  (1.753 m), weight 73 kg, SpO2 100 %. Body mass index is 23.78 kg/m.   Treatment Plan [facility]Daily contact with patient to assess and evaluate symptoms and progress in treatment and Medication management: Daily contact with patient to assess and evaluate  symptoms and progress in treatment and Medication management  Diagnoses / Active Problems: #Schizophrenia   PLAN: Safety and Monitoring:             -- Voluntary admission to inpatient psychiatric unit for safety, stabilization and treatment             -- Daily contact with patient to assess and evaluate symptoms and progress in treatment             -- Patient's case to be discussed in multi-disciplinary team meeting             -- Observation Level : q15 minute checks             -- Vital signs:  q12 hours             -- Precautions: suicide, elopement, and assault   2. Psychiatric Diagnoses and Treatment:  -- Continue Invega 6 mg daily for psychosis. Will discontinue upon discharge (after 2nd loading dose) --[place] administered 03/14/22 --Day 4 Invega LAI on 03/17/22 if continues to do well -- The risks/benefits/side-effects/alternatives to this medication were discussed in detail with the patient and time was given for questions. The patient consents to medication trial.              --  Metabolic profile and EKG monitoring obtained while on an atypical antipsychotic (BMI: 24 Lipid Panel: pending HbgA1c: pending QTc: 400)              -- Encouraged patient to participate in unit milieu and in scheduled group therapies              -- Short Term Goals: Ability to maintain clinical measurements within normal limits will improve, Compliance with prescribed medications will improve, and Ability to identify triggers associated with substance abuse/mental health issues will improve             -- Long Term Goals: Improvement in symptoms so as ready for discharge                3. Medical Issues Being Addressed:              Tobacco Use Disorder             -- Nicotine patch 7mg /24 hours ordered             -- Smoking cessation encouraged             Patient denies any medical problems, denies PTA medications aside from Zyprexa   4. Discharge Planning:              -- Social work and case management to assist with discharge planning and identification of hospital follow-up needs prior to discharge             -- Estimated LOS: 5-7 days             -- Discharge Concerns: Need to establish a safety plan; Medication compliance and effectiveness             -- Discharge Goals: Return home with outpatient referrals for mental health follow-up including medication management/psychotherapy             -- Can follow up at 2nd floor [facility] LAI clinic             -- Mother says she will transport patient and make sure he takes medication   [facility], MD 03/15/2022, 8:39 AM

## 2022-03-15 NOTE — Progress Notes (Signed)
The focus of this group is to help patients review their daily goal of treatment and discuss progress on daily workbooks.  Pt attended the evening group but responded minimally to discussion prompts from the Writer. Pt shared that today was a good day on the unit, the highlight of which was getting to go outside to the courtyard.  In discussing ways to stay well upon discharge, Eris mentioned wanting to get back on a regular schedule. "I'm the kind of person who needs to know what he's doing each day, like a routine."  Pt rated his day a 10 out of 10 and his affect was flat.

## 2022-03-15 NOTE — Progress Notes (Addendum)
BHH/BMU LCSW Progress Note   03/15/2022    11:23 AM  Devin Davis   [id number]   Type of Contact and Topic:  Housing Coordination   Patient has been provided list of shelter resources in surrounding areas with contact information. Patient encouraged to contact shelters to complete intake interviews. CSW will continue to follow up with patient related to finding suitable housing. Situation ongoing, CSW will continue to monitor and update note as more information becomes available.   States he has an operable vehicle. Patient expressed interest in the [employer] program operated by the [facility] on [address].    03/15/22 1121  SDOH Housing  Within the past 12 months, have you ever stayed: outside, in a car, in a tent, in an overnight shelter, or temporarily in someone else's home (i.e. couch-surfing) 1  Are you worried about losing your housing? 1  Do you have problems with pests (bugs, ants, mice), mold, lead and/or water leaks at the place where you stay? 0  Housing risk score 2  SDOH Interventions  Housing Interventions Inpatient TOC;Other (Comment)     Signed:  Corky Crafts, MSW, LCSWA, LCAS 03/15/2022 11:23 AM

## 2022-03-15 NOTE — Progress Notes (Signed)
   03/14/22 2200  Psych Admission Type (Psych Patients Only)  Admission Status Voluntary  Psychosocial Assessment  Patient Complaints None  Eye Contact Suspiciousness  Facial Expression Fixed smile  Affect Apprehensive  Speech Logical/coherent  Interaction Forwards little;Guarded  Motor Activity Shuffling  Appearance/Hygiene Unremarkable  Behavior Characteristics Cooperative;Appropriate to situation  Mood Pleasant  Thought Process  Coherency Concrete thinking  Content Preoccupation  Delusions None reported or observed  Perception WDL  Hallucination None reported or observed  Judgment Limited  Confusion None  Danger to Self  Current suicidal ideation? Denies  Agreement Not to Harm Self Yes  Description of Agreement verbal  Danger to Others  Danger to Others None reported or observed

## 2022-03-15 NOTE — BHH Group Notes (Signed)
Adult Psychoeducational Group Note  Date:  03/15/2022 Time:  10:22 AM  Group Topic/Focus:  Goals Group:   The focus of this group is to help patients establish daily goals to achieve during treatment and discuss how the patient can incorporate goal setting into their daily lives to aide in recovery.  Participation Level:  Did Not Attend  Additional Comments:  Patient was told multiple times that group was starting, but still did not attend.   Wenda Vanschaick T Ailyne Pawley 03/15/2022, 10:22 AM

## 2022-03-16 DIAGNOSIS — F209 Schizophrenia, unspecified: Secondary | ICD-10-CM | POA: Diagnosis not present

## 2022-03-16 MED ORDER — HYDROXYZINE HCL 25 MG PO TABS: 25.0000 mg | ORAL_TABLET | Freq: Three times a day (TID) | ORAL | 0 refills | Status: DC | PRN

## 2022-03-16 MED ORDER — TRAZODONE HCL 50 MG PO TABS: 50.0000 mg | ORAL_TABLET | Freq: Every evening | ORAL | 0 refills | Status: DC | PRN

## 2022-03-16 MED ORDER — INVEGA SUSTENNA 234 MG/1.5ML IM SUSY: 234.0000 mg | PREFILLED_SYRINGE | INTRAMUSCULAR | 0 refills | Status: DC

## 2022-03-16 MED ORDER — ALUM & MAG HYDROXIDE-SIMETH 200-200-20 MG/5ML PO SUSP
15.0000 mL | Freq: Four times a day (QID) | ORAL | Status: DC | PRN
  Administered 2022-03-16: 15 mL via ORAL
  Filled 2022-03-16: qty 30

## 2022-03-16 MED ORDER — PALIPERIDONE PALMITATE ER 156 MG/ML IM SUSY
156.0000 mg | PREFILLED_SYRINGE | Freq: Once | INTRAMUSCULAR | Status: DC
  Filled 2022-03-16: qty 1

## 2022-03-16 NOTE — Progress Notes (Signed)
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  Patient was discharged on Zyprexa 5 mg.  No other admissions in the medical record.  He presented voluntarily to the [facility] behavioral health urgent care on 9/7 complaining of hallucinations.  He was transferred to the [facility] behavioral health hospital for further treatment on a voluntary basis.  Current diagnosis is schizophrenia.  On assessment this morning, the patient continues to do well.  Denies SI/HI/AVH.  Patient reports some shoulder pains related to LAI injection but no other complaints at this time.  Reports that he is sleeping well and eating well.  Patient denies any somatic complaints and denies constipation or urinary symptoms.  Patient is eager for discharge tomorrow.  Reports mood and anxiety are unchanged.  No further questions at this time.  AIMS: 0, no cogwheeling, stiffness, abnormal facial movements noted on exam Principal Problem: Schizophrenia (HCC) Diagnosis: Principal Problem:   Schizophrenia (HCC) Active Problems:   Psychosis (HCC)  Total Time spent with patient: 15 minutes  Past Psychiatric History: as above  Past Medical History:  Past Medical History:  Diagnosis Date   Fracture of metacarpal of right hand, closed 08/04/2010   Comminuted fx @ base  of [facility]   GERD (gastroesophageal reflux disease)    History reviewed. No pertinent surgical history. Family History: History reviewed. No pertinent family history. Family Psychiatric  History: per H and P, family Hx of schizophrenia Social History:  Social History   Substance and Sexual Activity  Alcohol Use Yes   Alcohol/week: 4.0 standard drinks of alcohol   Types: 2 Cans of beer, 2 Shots of  liquor per week   Comment: occ     Social History   Substance and Sexual Activity  Drug Use Not Currently    Social History   Socioeconomic History   Marital status: Single    Spouse name: Not on file   Number of children: Not on file   Years of education: Not on file   Highest education level: Not on file  Occupational History   Not on file  Tobacco Use   Smoking status: Every Day    Types: E-cigarettes   Smokeless tobacco: Never  Vaping Use   Vaping Use: Never used  Substance and Sexual Activity   Alcohol use: Yes    Alcohol/week: 4.0 standard drinks of alcohol    Types: 2 Cans of beer, 2 Shots of liquor per week    Comment: occ   Drug use: Not Currently   Sexual activity: Not Currently  Other Topics Concern   Not on file  Social History Narrative   Not on file   Social Determinants of Health   Financial Resource Strain: Not on file  Food Insecurity: Not on file  Transportation Needs: Unknown (03/11/2022)   PRAPARE - Administrator, Civil Service (Medical): Patient refused    Lack of Transportation (Non-Medical): Not on file  Physical Activity: Not on file  Stress: Not on file  Social Connections: Not on file   Additional Social History:        Sleep: Fair  Appetite:  Good  Current Medications: Current Facility-Administered Medications  Medication Dose Route Frequency Provider Last Rate Last Admin   acetaminophen (TYLENOL)  tablet 650 mg  650 mg Oral Q6H PRN Sindy Guadeloupe, NP       benztropine (COGENTIN) tablet 2 mg  2 mg Oral BID PRN Carlyn Reichert, MD       Or   benztropine mesylate (COGENTIN) injection 2 mg  2 mg Intramuscular BID PRN Carlyn Reichert, MD       hydrOXYzine (ATARAX) tablet 25 mg  25 mg Oral TID PRN Bobbitt, Shalon E, NP   25 mg at 03/15/22 2129   magnesium hydroxide (MILK OF MAGNESIA) suspension 30 mL  30 mL Oral Daily PRN Sindy Guadeloupe, NP       paliperidone (INVEGA) 24 hr tablet 6 mg  6 mg Oral Daily Carlyn Reichert, MD    6 mg at 03/15/22 0859   traZODone (DESYREL) tablet 50 mg  50 mg Oral QHS PRN Bobbitt, Shalon E, NP   50 mg at 03/15/22 2129    Lab Results:  No results found for this or any previous visit (from the past 48 hour(s)).   Blood Alcohol level:  Lab Results  Component Value Date   ETH <10 03/10/2022    Metabolic Disorder Labs: Lab Results  Component Value Date   HGBA1C 5.8 (H) 03/13/2022   MPG 119.76 03/13/2022   MPG 114.02 10/13/2021   Lab Results  Component Value Date   PROLACTIN 1.8 (L) 10/13/2021   Lab Results  Component Value Date   CHOL 153 03/13/2022   TRIG 65 03/13/2022   HDL 53 03/13/2022   CHOLHDL 2.9 03/13/2022   VLDL 13 03/13/2022   LDLCALC 87 03/13/2022   LDLCALC 63 10/13/2021    Physical Findings:   Musculoskeletal: Strength & Muscle Tone: within normal limits Gait & Station: normal Patient leans: N/A  Psychiatric Specialty Exam:  Presentation  General Appearance: Appropriate for Environment; Casual  Eye Contact:Good  Speech:Clear and Coherent; Normal Rate  Speech Volume:Normal  Handedness:Right   Mood and Affect  Mood: euthymic Affect: constricted, approaching normality  Thought Process  Thought Processes:Coherent; Goal Directed; Linear  Descriptions of Associations:Intact  Orientation:Full (Time, Place and Person)  Thought Content:Logical  History of Schizophrenia/Schizoaffective disorder:No  Duration of Psychotic Symptoms:Less than six months  Hallucinations: denies Ideas of Reference:None  Suicidal Thoughts: denies Homicidal Thoughts: denies  Sensorium  Memory:Immediate Good; Recent Good; Remote Good  Judgment:Intact  Insight:Fair   Executive Functions  Concentration:Fair  Attention Span:Fair  Recall:Fair  Fund of Knowledge:Fair  Language:Fair   Psychomotor Activity  Psychomotor Activity: normal Assets  Assets:Communication Skills; Desire for Improvement; Housing; Social Support; Physical  Health   Sleep  Sleep: fair  Physical Exam: Physical Exam Constitutional:      Appearance: the patient is not toxic-appearing.  Pulmonary:     Effort: Pulmonary effort is normal.  Neurological:     General: No focal deficit present.     Mental Status: the patient is alert and oriented to person, place, and time.   Review of Systems  Respiratory:  Negative for shortness of breath.   Cardiovascular:  Negative for chest pain.  Gastrointestinal:  Negative for abdominal pain, constipation, diarrhea, nausea and vomiting.  Neurological:  Negative for headaches.   Blood pressure (!) 144/88, pulse 77, temperature 97.8 F (36.6 C), temperature source Oral, resp. rate 16, height 5\' 9"  (1.753 m), weight 73 kg, SpO2 99 %. Body mass index is 23.78 kg/m.   Treatment Plan Sum[facility]Daily contact with patient to assess and evaluate symptoms and progress in treatment and Medication management: Daily contact with patient to  assess and evaluate symptoms and progress in treatment and Medication management  Diagnoses / Active Problems: #Schizophrenia   PLAN: Safety and Monitoring:             -- Voluntary admission to inpatient psychiatric unit for safety, stabilization and treatment             -- Daily contact with patient to assess and evaluate symptoms and progress in treatment             -- Patient's case to be discussed in multi-disciplinary team meeting             -- Observation Level : q15 minute checks             -- Vital signs:  q12 hours             -- Precautions: suicide, elopement, and assault   2. Psychiatric Diagnoses and Treatment:  -- Continue Invega 6 mg daily for psychosis. Will discontinue upon discharge (after 2nd loading dose) --[place] administered 03/14/22 --Day 4 Invega LAI on 03/17/22 if continues to do well -- The risks/benefits/side-effects/alternatives to this medication were discussed in detail with the patient and time was given for questions. The patient  consents to medication trial.              -- Metabolic profile and EKG monitoring obtained while on an atypical antipsychotic (BMI: 24 Lipid Panel: pending HbgA1c: pending QTc: 400)              -- Encouraged patient to participate in unit milieu and in scheduled group therapies              -- Short Term Goals: Ability to maintain clinical measurements within normal limits will improve, Compliance with prescribed medications will improve, and Ability to identify triggers associated with substance abuse/mental health issues will improve             -- Long Term Goals: Improvement in symptoms so as ready for discharge                3. Medical Issues Being Addressed:              Tobacco Use Disorder             -- Nicotine patch 7mg /24 hours ordered             -- Smoking cessation encouraged             Patient denies any medical problems, denies PTA medications aside from Zyprexa   4. Discharge Planning:              -- Social work and case management to assist with discharge planning and identification of hospital follow-up needs prior to discharge             -- Estimated LOS: 5-7 days             -- Discharge Concerns: Need to establish a safety plan; Medication compliance and effectiveness             -- Discharge Goals: Return home with outpatient referrals for mental health follow-up including medication management/psychotherapy             -- Can follow up at 2nd floor [facility] LAI clinic             -- Mother says she will transport patient and make sure he takes medication   [facility], MD 03/16/2022, 8:26 AM

## 2022-03-16 NOTE — Group Note (Signed)
Date:  03/16/2022 Time:  11:19 AM  Group Topic/Focus:  Goals Group:   The focus of this group is to help patients establish daily goals to achieve during treatment and discuss how the patient can incorporate goal setting into their daily lives to aide in recovery. Orientation:   The focus of this group is to educate the patient on the purpose and policies of crisis stabilization and provide a format to answer questions about their admission.  The group details unit policies and expectations of patients while admitted.    Participation Level:  Active  Participation Quality:  Appropriate  Affect:  Appropriate  Cognitive:  Appropriate  Insight: Lacking  Engagement in Group:  Aprropriate  Modes of Intervention:  Discussion  Additional Comments:  Pt wants to keep his head clear and focused.  Jaquita Rector 03/16/2022, 11:19 AM

## 2022-03-16 NOTE — Group Note (Signed)
LCSW Group Therapy Note   Group Date: 03/16/2022 Start Time: 1300 End Time: 1400  Type of Therapy and Topic:  Group Therapy:  Healthy and Unhealthy Supports  Participation Level:  Active   Description of Group:  Patients in this group were introduced to the idea of adding a variety of healthy supports to address the various needs in their lives, especially in reference to their plans and focus for the new year.  Patients discussed what additional healthy supports could be helpful in their recovery and wellness after discharge in order to prevent future hospitalizations.   An emphasis was placed on using counselor, doctor, therapy groups, 12-step groups, and problem-specific support groups to expand supports.    Therapeutic Goals:   1)  discuss importance of adding supports to stay well once out of the hospital  2)  compare healthy versus unhealthy supports and identify some examples of each  3)  generate ideas and descriptions of healthy supports that can be added  4)  offer mutual support about how to address unhealthy supports  5)  encourage active participation in and adherence to discharge plan    Summary of Patient Progress:  The patient stated that their current healthy supports are their mother and father. The patient expressed a willingness to add additional support(s) and coping skills to help them better themselves outside of the hospital setting. The Pt was also able to identify motivators to help them remain focused.  The Pt attended group and remained there the entire time.  The Pt accepted all worksheets and was appropriate with their peers during the group discussion.    Therapeutic Modalities:   Motivational Interviewing Brief Solution-Focused Therapy  [name], Theresia Majors 03/16/2022  1:38 PM

## 2022-03-16 NOTE — Progress Notes (Signed)
   03/16/22 2000  Psych Admission Type (Psych Patients Only)  Admission Status Voluntary  Psychosocial Assessment  Patient Complaints None  Eye Contact Fair  Facial Expression Flat  Affect Appropriate to circumstance  Speech Logical/coherent  Interaction Minimal  Motor Activity Slow  Appearance/Hygiene Unremarkable  Behavior Characteristics Cooperative;Appropriate to situation  Mood Pleasant  Thought Process  Coherency Concrete thinking  Content WDL  Delusions None reported or observed  Perception WDL  Hallucination None reported or observed  Judgment WDL  Confusion None  Danger to Self  Current suicidal ideation? Denies  Danger to Others  Danger to Others None reported or observed   Progress note   D: Pt seen in his room. Pt denies SI, HI, AVH. Pt rates pain  0/10. Pt rates anxiety  0/10 and depression  0/10. Pt attended group this evening and is seen in the milieu. Pt c/o indigestion d/t what he ate for dinner. Provider notified. Pt denies any other complaints at this time.  A: Pt provided support and encouragement. Pt given scheduled medication as prescribed. PRNs as appropriate. Q15 min checks for safety.   R: Pt safe on the unit. Will continue to monitor.

## 2022-03-16 NOTE — Group Note (Unsigned)
Date:  03/16/2022 Time:  9:53 AM  Group Topic/Focus:  Goals Group:   The focus of this group is to help patients establish daily goals to achieve during treatment and discuss how the patient can incorporate goal setting into their daily lives to aide in recovery. Orientation:   The focus of this group is to educate the patient on the purpose and policies of crisis stabilization and provide a format to answer questions about their admission.  The group details unit policies and expectations of patients while admitted.     Participation Level:  {BHH PARTICIPATION LEVEL:22264}  Participation Quality:  {BHH PARTICIPATION QUALITY:22265}  Affect:  {BHH AFFECT:22266}  Cognitive:  {BHH COGNITIVE:22267}  Insight: {BHH Insight2:20797}  Engagement in Group:  {BHH ENGAGEMENT IN GROUP:22268}  Modes of Intervention:  {BHH MODES OF INTERVENTION:22269}  Additional Comments:  ***  Hutson Luft Lashawn Jaimie Redditt 03/16/2022, 9:53 AM  

## 2022-03-16 NOTE — Group Note (Unsigned)
Date:  03/16/2022 Time:  9:55 AM  Group Topic/Focus:  Goals Group:   The focus of this group is to help patients establish daily goals to achieve during treatment and discuss how the patient can incorporate goal setting into their daily lives to aide in recovery. Orientation:   The focus of this group is to educate the patient on the purpose and policies of crisis stabilization and provide a format to answer questions about their admission.  The group details unit policies and expectations of patients while admitted.     Participation Level:  {BHH PARTICIPATION LEVEL:22264}  Participation Quality:  {BHH PARTICIPATION QUALITY:22265}  Affect:  {BHH AFFECT:22266}  Cognitive:  {BHH COGNITIVE:22267}  Insight: {BHH Insight2:20797}  Engagement in Group:  {BHH ENGAGEMENT IN GROUP:22268}  Modes of Intervention:  {BHH MODES OF INTERVENTION:22269}  Additional Comments:  ***  Sieara Bremer Lashawn Kamille Toomey 03/16/2022, 9:55 AM  

## 2022-03-17 DIAGNOSIS — F209 Schizophrenia, unspecified: Secondary | ICD-10-CM | POA: Diagnosis not present

## 2022-03-17 MED ORDER — PALIPERIDONE PALMITATE ER 156 MG/ML IM SUSY
156.0000 mg | PREFILLED_SYRINGE | Freq: Once | INTRAMUSCULAR | Status: AC
  Administered 2022-03-17: 156 mg via INTRAMUSCULAR

## 2022-03-17 NOTE — BHH Suicide Risk Assessment (Addendum)
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  Patient was discharged on Zyprexa 5 mg.  No other admissions in the medical record.  He presented voluntarily to the [facility] behavioral health urgent care on 9/7 complaining of hallucinations.  He was transferred to the [facility] behavioral health hospital for further treatment on a voluntary basis.  Current diagnosis is schizophrenia.  Hospital Summary During the patient's hospitalization, patient had extensive initial psychiatric evaluation, and follow-up psychiatric evaluations every day.  Psychiatric diagnoses provided upon initial assessment:  Schizophrenia  Patient's psychiatric medications were adjusted on admission: -- Discontinue home Zyprexa 5 mg QHS -- Start Invega 6 mg daily for psychosis -- Plan for LAI Invega before D/C  During the hospitalization, other adjustments were made to the patient's psychiatric medication regimen:  - Discontinue Invega PO as received 2 doses of Invega Sustenna -[place] administered 03/14/22 -2nd Invega Sustenna dose on 03/17/22 if continues to do well  Gradually, patient started adjusting to milieu.   Patient's care was discussed during the interdisciplinary team meeting every day during the hospitalization.  The patient denies having side effects to prescribed psychiatric medication.  The patient reports their target psychiatric symptoms of psychosis responded well to the psychiatric medications, and the patient reports overall benefit other psychiatric hospitalization. Supportive psychotherapy was provided to the patient.  The patient also participated in regular group therapy while admitted.   Labs were reviewed with the patient, and abnormal results were discussed with the patient.  The patient denied having suicidal thoughts more than 48 hours prior to discharge.  Patient denies having homicidal thoughts.  Patient denies having auditory hallucinations.  Patient denies any visual hallucinations.  Patient denies having paranoid thoughts.  The patient is able to verbalize their individual safety plan to this provider.  It is recommended to the patient to continue psychiatric medications as prescribed, after discharge from the hospital.    It is recommended to the patient to follow up with your outpatient psychiatric provider and PCP.  Discussed with the patient, the impact of alcohol, drugs, tobacco have been there overall psychiatric and medical wellbeing, and total abstinence from substance use was recommended the patient.   Total Time spent with patient: 45 minutes  Musculoskeletal: Strength & Muscle Tone: within normal limits Gait & Station: normal Patient leans: N/A  Psychiatric Specialty Exam  Presentation  General Appearance: Appropriate for Environment; Casual   Eye Contact:Good   Speech:Clear and Coherent; Normal Rate   Speech Volume:Normal   Handedness:Right    Mood and Affect  Mood:Euthymic   Duration of Depression Symptoms: Less than two weeks   Affect:Appropriate; Congruent    Thought Process  Thought Processes:Coherent; Goal Directed; Linear   Descriptions of Associations:Intact   Orientation:Full (Time, Place and Person)   Thought Content:Logical   History of Schizophrenia/Schizoaffective disorder:No   Duration of Psychotic Symptoms:Less than six months   Hallucinations:Hallucinations: None  Ideas of Reference:None   Suicidal Thoughts:Suicidal Thoughts: No  Homicidal Thoughts:Homicidal Thoughts: No   Sensorium  Memory:Immediate Good;  Recent Good; Remote Good   Judgment:Intact   Insight:Fair    Executive Functions  Concentration:Fair   Attention Span:Fair   Recall:Fair   Fund of  Knowledge:Fair   Language:Fair    Psychomotor Activity  Psychomotor Activity:Psychomotor Activity: Normal   Assets  Assets:Communication Skills; Desire for Improvement; Housing; Social Support; Physical Health    Sleep  Sleep:Sleep: Good   Physical Exam: Physical Exam Vitals and nursing note reviewed.  Constitutional:      Appearance: Normal appearance. He is normal weight.  HENT:     Head: Normocephalic and atraumatic.  Pulmonary:     Effort: Pulmonary effort is normal.  Neurological:     General: No focal deficit present.     Mental Status: He is oriented to person, place, and time.    Review of Systems  Respiratory:  Negative for shortness of breath.   Cardiovascular:  Negative for chest pain.  Gastrointestinal:  Negative for abdominal pain, constipation, diarrhea, heartburn, nausea and vomiting.  Neurological:  Negative for headaches.   Blood pressure 119/75, pulse (!) 102, temperature 98.1 F (36.7 C), temperature source Oral, resp. rate 16, height 5\' 9"  (1.753 m), weight 73 kg, SpO2 99 %. Body mass index is 23.78 kg/m.  Mental Status Per Nursing Assessment::   On Admission:  NA  Demographic Factors:  Male, Low socioeconomic status, and Unemployed  Loss Factors: Decrease in vocational status and Financial problems/change in socioeconomic status  Historical Factors: Impulsivity  Risk Reduction Factors:   Living with another person, especially a relative, Positive social support, Positive therapeutic relationship, and Positive coping skills or problem solving skills  Continued Clinical Symptoms:  Alcohol/Substance Abuse/Dependencies Schizophrenia:   Less than 40 years old More than one psychiatric diagnosis Previous Psychiatric Diagnoses and Treatments  Cognitive Features That  Contribute To Risk:  None    Suicide Risk:  Mild:  Suicidal ideation of limited frequency, intensity, duration, and specificity.  There are no identifiable plans, no associated intent, mild dysphoria and related symptoms, good self-control (both objective and subjective assessment), few other risk factors, and identifiable protective factors, including available and accessible social support.   Follow-up Information     [facility]. Go on 04/06/2022.   Specialty: Behavioral Health Why: You have a scheduled outpatient appointment at 04/06/2022 at 2:00 PM for shot clinic. Please call if you need to reschedule this appointment. Contact information: [phone] [place] [phone]                Plan Of Care/Follow-up recommendations:  Activity: as tolerated  Diet: heart healthy  Other: -Follow-up with your outpatient psychiatric provider -instructions on appointment date, time, and address (location) are provided to you in discharge paperwork.  -Take your psychiatric medications as prescribed at discharge - instructions are provided to you in the discharge paperwork  -Follow-up with outpatient primary care doctor and other specialists -for management of chronic medical disease, including: prediabetes (A1c 5.8)  -Testing: Follow-up with outpatient provider for abnormal lab results: A1c  -Recommend abstinence from alcohol, tobacco, and other illicit drug use at discharge.   -If your psychiatric symptoms recur, worsen, or if you have side effects to your psychiatric medications, call your outpatient psychiatric provider, 911, [phone] or go to the nearest emergency department.  -If suicidal thoughts recur, call your outpatient psychiatric provider, 911, [phone] or go to the nearest emergency department.   [phone], MD 03/17/2022, 8:52 AM

## 2022-03-17 NOTE — Discharge Summary (Addendum)
Physician Discharge Summary Note  Patient:  Devin Davis is an 36 y.o., male MRN:  [id number] DOB:  05-Apr-1986 Patient phone:  [phone] (home)  Patient address:   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  Patient was discharged on Zyprexa 5 mg.  No other admissions in the medical record.  He presented voluntarily to the [facility] behavioral health urgent care on 9/7 complaining of hallucinations.  He was transferred to the [facility] behavioral [facility] for further treatment on a voluntary basis.  Current diagnosis is schizophrenia.  Principal Problem: Schizophrenia [facility]) Discharge Diagnoses: Principal Problem:   Schizophrenia (HCC) Active Problems:   Psychosis (HCC)    Past Psychiatric History: see H&P  Past Medical History:  Past Medical History:  Diagnosis Date   Fracture of metacarpal of right hand, closed 08/04/2010   Comminuted fx @ base  of R 4[facility]   GERD (gastroesophageal reflux disease)    History reviewed. No pertinent surgical history. Family History: History reviewed. No pertinent family history. Family Psychiatric  History: see H&P Social History:  Social History   Substance and Sexual Activity  Alcohol Use Yes   Alcohol/week: 4.0 standard drinks of alcohol   Types: 2 Cans of beer, 2 Shots of liquor per week   Comment: occ     Social History   Substance and Sexual Activity  Drug Use Not Currently    Social History   Socioeconomic History   Marital status: Single    Spouse name: Not on file   Number of children: Not on file   Years of education: Not on file   Highest education level: Not on file   Occupational History   Not on file  Tobacco Use   Smoking status: Every Day    Types: E-cigarettes   Smokeless tobacco: Never  Vaping Use   Vaping Use: Never used  Substance and Sexual Activity   Alcohol use: Yes    Alcohol/week: 4.0 standard drinks of alcohol    Types: 2 Cans of beer, 2 Shots of liquor per week    Comment: occ   Drug use: Not Currently   Sexual activity: Not Currently  Other Topics Concern   Not on file  Social History Narrative   Not on file   Social Determinants of Health   Financial Resource Strain: Not on file  Food Insecurity: Not on file  Transportation Needs: Unknown (03/11/2022)   PRAPARE - Administrator, Civil Service (Medical): Patient refused    Lack of Transportation (Non-Medical): Not on file  Physical Activity: Not on file  Stress: Not on file  Social Connections: Not on file    [facility] Course:   During the patient's hospitalization, patient had extensive initial psychiatric evaluation, and follow-up psychiatric evaluations every day.   Psychiatric diagnoses provided upon initial assessment:  Schizophrenia   Patient's psychiatric medications were adjusted on admission: -- Discontinue home Zyprexa 5 mg QHS -- Start Invega 6 mg daily for psychosis -- Plan for LAI Invega before D/C   During the hospitalization, other adjustments were made to the patient's psychiatric medication regimen:  - Discontinue Invega PO as received 2 doses of [place]  Sustenna administered 03/14/22 -2nd Invega Sustenna dose on 03/17/22 if continues to do well   Gradually, patient started adjusting to milieu.   Patient's care was discussed during the interdisciplinary team meeting every day during the hospitalization.   The patient denies having side effects to prescribed psychiatric medication.   The patient reports their target psychiatric symptoms of psychosis responded well to the psychiatric medications, and the patient reports  overall benefit other psychiatric hospitalization. Supportive psychotherapy was provided to the patient. The patient also participated in regular group therapy while admitted.    Labs were reviewed with the patient, and abnormal results were discussed with the patient.   The patient denied having suicidal thoughts more than 48 hours prior to discharge.  Patient denies having homicidal thoughts.  Patient denies having auditory hallucinations.  Patient denies any visual hallucinations.  Patient denies having paranoid thoughts.   The patient is able to verbalize their individual safety plan to this provider.   It is recommended to the patient to continue psychiatric medications as prescribed, after discharge from the [facility].     It is recommended to the patient to follow up with your outpatient psychiatric provider and PCP.   Discussed with the patient, the impact of alcohol, drugs, tobacco have been there overall psychiatric and medical wellbeing, and total abstinence from substance use was recommended the patient.  Physical Findings: AIMS:  Facial and Oral Movements Muscles of Facial Expression: None, normal Lips and Perioral Area: None, normal Jaw: None, normal Tongue: None, normal, Extremity Movements Upper (arms, wrists, hands, fingers): None, normal Lower (legs, knees, ankles, toes): None, normal,  Trunk Movements Neck, shoulders, hips: None, normal,  Overall Severity Severity of abnormal movements (highest score from questions above): None, normal Incapacitation due to abnormal movements: None, normal Patient's awareness of abnormal movements (rate only patient's report): No Awareness,  Dental Status Current problems with teeth and/or dentures?: No Does patient usually wear dentures?: No   Musculoskeletal: Strength & Muscle Tone: within normal limits Gait & Station: normal Patient leans: N/A   Psychiatric Specialty Exam:  Presentation  General Appearance: Appropriate  for Environment; Casual   Eye Contact:Good   Speech:Clear and Coherent; Normal Rate   Speech Volume:Normal   Handedness:Right    Mood and Affect  Mood:Euthymic   Affect:Appropriate; Congruent    Thought Process  Thought Processes:Coherent; Goal Directed; Linear   Descriptions of Associations:Intact   Orientation:Full (Time, Place and Person)   Thought Content:Logical   History of Schizophrenia/Schizoaffective disorder:No   Duration of Psychotic Symptoms:Less than six months   Hallucinations:Hallucinations: None   Ideas of Reference:None   Suicidal Thoughts:Suicidal Thoughts: No   Homicidal Thoughts:Homicidal Thoughts: No    Sensorium  Memory:Immediate Good; Recent Good; Remote Good   Judgment:Intact   Insight:Fair    Executive Functions  Concentration:Fair   Attention Span:Fair   Recall:Fair   Fund of Knowledge:Fair   Language:Fair    Psychomotor Activity  Psychomotor Activity:Psychomotor Activity: Normal    Assets  Assets:Communication Skills; Desire for Improvement; Housing; Social Support; Physical Health    Sleep  Sleep:Sleep: Good     Physical Exam: Physical Exam Vitals and nursing note reviewed.  Constitutional:      Appearance: Normal appearance. He is normal weight.  HENT:     Head: Normocephalic and atraumatic.  Pulmonary:     Effort: Pulmonary effort is normal.  Neurological:     General: No focal deficit present.     Mental Status: He is oriented to person, place,  and time.    Review of Systems  Respiratory:  Negative for shortness of breath.   Cardiovascular:  Negative for chest pain.  Gastrointestinal:  Negative for abdominal pain, constipation, diarrhea, heartburn, nausea and vomiting.  Neurological:  Negative for headaches.   Blood pressure 119/75, pulse (!) 102, temperature 98.1 F (36.7 C), temperature source Oral, resp. rate 16, height 5\' 9"  (1.753 m), weight 73 kg, SpO2 99 %.  Body mass index is 23.78 kg/m.   Social History   Tobacco Use  Smoking Status Every Day   Types: E-cigarettes  Smokeless Tobacco Never   Tobacco Cessation:  N/A, patient does not currently use tobacco products   Blood Alcohol level:  Lab Results  Component Value Date   ETH <10 03/10/2022    Metabolic Disorder Labs:  Lab Results  Component Value Date   HGBA1C 5.8 (H) 03/13/2022   MPG 119.76 03/13/2022   MPG 114.02 10/13/2021   Lab Results  Component Value Date   PROLACTIN 1.8 (L) 10/13/2021   Lab Results  Component Value Date   CHOL 153 03/13/2022   TRIG 65 03/13/2022   HDL 53 03/13/2022   CHOLHDL 2.9 03/13/2022   VLDL 13 03/13/2022   LDLCALC 87 03/13/2022   LDLCALC 63 10/13/2021    See Psychiatric Specialty Exam and Suicide Risk Assessment completed by Attending Physician prior to discharge.  Discharge destination:  Home  Is patient on multiple antipsychotic therapies at discharge:  No   Has Patient had three or more failed trials of antipsychotic monotherapy by history:  No  Recommended Plan for Multiple Antipsychotic Therapies: NA   Allergies as of 03/17/2022   No Known Allergies      Medication List     STOP taking these medications    OLANZapine zydis 5 MG disintegrating tablet Commonly known as: ZYPREXA       TAKE these medications      Indication  hydrOXYzine 25 MG tablet Commonly known as: ATARAX Take 1 tablet (25 mg total) by mouth 3 (three) times daily as needed for anxiety.  Indication: Feeling Anxious   Invega Sustenna 234 MG/1.5ML injection Generic drug: paliperidone Inject 234 mg into the muscle every 30 (thirty) days. Start taking on: April 13, 2022  Indication: Schizophrenia   traZODone 50 MG tablet Commonly known as: DESYREL Take 1 tablet (50 mg total) by mouth at bedtime as needed for sleep.  Indication: Trouble Sleeping        Follow-up Information     [facility]. Go on  04/06/2022.   Specialty: Behavioral Health Why: You have a scheduled outpatient appointment at 04/06/2022 at 2:00 PM for shot clinic. Please call if you need to reschedule this appointment. Contact information: [phone] [place] [phone]                Follow-up recommendations:   Activity:  as tolerated Diet:  heart healthy   Comments:  Prescriptions were given at discharge.  Patient is agreeable with the discharge plan.  Patient was given an opportunity to ask questions.  Patient appears to feel comfortable with discharge and denies any current suicidal or homicidal thoughts.    Patient is instructed prior to discharge to: Take all medications as prescribed by mental healthcare provider. Report any adverse effects and or reactions from the medicines to outpatient provider promptly. In the event of worsening symptoms, patient is instructed to call the crisis hotline, 911 and or go to the nearest  ED for appropriate evaluation and treatment of symptoms. Patient is to follow-up with primary care provider for other medical issues, concerns and or health care needs.   Signed: Park Pope, MD 03/17/2022, 8:51 AM

## 2022-03-17 NOTE — Progress Notes (Signed)
  [facility] Adult Case Management Discharge Plan :  Will you be returning to the same living situation after discharge:  Yes,  Home  At discharge, do you have transportation home?: Yes,  Family  Do you have the ability to pay for your medications: Yes,  Family and Community  Release of information consent forms completed and in the chart;  Patient's signature needed at discharge.  Patient to Follow up at:  Follow-up Information     [facility]. Go on 04/06/2022.   Specialty: Behavioral Health Why: You have a scheduled outpatient appointment at 04/06/2022 at 2:00 PM for shot clinic. Please call if you need to reschedule this appointment. Contact information: [phone] [place] [address] [phone]                Next level of care provider has access to [facility] Link:yes  Safety Planning and Suicide Prevention discussed: Yes,  with patient and mother      Has patient been referred to the Quitline?: Patient refused referral  Patient has been referred for addiction treatment: Pt. refused referral  [name], LCSWA 03/17/2022, 9:38 AM

## 2022-03-17 NOTE — Progress Notes (Signed)
Date:  03/17/2022 Time:  9:00 AM   Group Topic/Focus:    Goals Group:   The focus of this group is to help patients establish daily goals to achieve during treatment and discuss how the patient can incorporate goal setting into their daily lives to aide in recovery.   Orientation:   The focus of this group is to educate the patient on the purpose and policies of crisis stabilization and provide a format to answer questions about their admission.  The group details unit policies and expectations of patients while admitted.    Participation Level:  Did not attend   Participation Quality:  Did not attend   Affect:  Did not attend  Cognitive:  Did not attend   Insight: Did not attend   Engagement in Group:  Did not attend   Modes of Intervention:  Did not attend   Additional Comments:  N/A   Doshia Dalia  RN 03/17/2022 9:30AM         

## 2022-03-17 NOTE — Progress Notes (Addendum)
D: Pt A & O X 3. Denies SI, HI, AVH and pain at this time. D/C home as ordered. Picked up in lobby by his mother. A: D/C instructions reviewed with pt including prescriptions, medication samples and follow up appointment, compliance encouraged. All belongings from locker 42 returned to pt at time of departure. Scheduled medications given with verbal education and effects monitored. Safety checks maintained without incident till time of d/c.  R: Pt receptive to care. Compliant with medications when offered. Denies adverse drug reactions when assessed. Verbalized understanding related to d/c instructions. Signed belonging sheet in agreement with items received from locker. Ambulatory with a steady gait. Appears to be in no physical distress at time of departure.

## 2022-04-06 ENCOUNTER — Ambulatory Visit (HOSPITAL_COMMUNITY): Payer: No Payment, Other

## 2022-04-13 ENCOUNTER — Ambulatory Visit (INDEPENDENT_AMBULATORY_CARE_PROVIDER_SITE_OTHER): Payer: No Payment, Other | Admitting: Student in an Organized Health Care Education/Training Program

## 2022-04-13 ENCOUNTER — Encounter (HOSPITAL_COMMUNITY): Payer: Self-pay | Admitting: Student in an Organized Health Care Education/Training Program

## 2022-04-13 ENCOUNTER — Encounter (HOSPITAL_COMMUNITY): Payer: Self-pay

## 2022-04-13 ENCOUNTER — Ambulatory Visit (INDEPENDENT_AMBULATORY_CARE_PROVIDER_SITE_OTHER): Payer: No Payment, Other

## 2022-04-13 VITALS — BP 138/83 | HR 72 | Ht 69.0 in | Wt 194.8 lb

## 2022-04-13 DIAGNOSIS — F209 Schizophrenia, unspecified: Secondary | ICD-10-CM

## 2022-04-13 DIAGNOSIS — F19959 Other psychoactive substance use, unspecified with psychoactive substance-induced psychotic disorder, unspecified: Secondary | ICD-10-CM

## 2022-04-13 MED ORDER — TRAZODONE HCL 50 MG PO TABS: 50.0000 mg | ORAL_TABLET | Freq: Every evening | ORAL | 0 refills | Status: DC | PRN

## 2022-04-13 MED ORDER — PALIPERIDONE PALMITATE ER 156 MG/ML IM SUSY
156.0000 mg | PREFILLED_SYRINGE | Freq: Once | INTRAMUSCULAR | Status: AC
  Administered 2022-04-13: 156 mg via INTRAMUSCULAR

## 2022-04-13 NOTE — Progress Notes (Signed)
[facility] MD/PA/NP OP Progress Note  04/13/2022 3:10 PM [name]  MRN:  [id number]  Chief Complaint:  Chief Complaint  Patient presents with   Establish Care   HPI: Devin Davis is a 36 yo patient w/ PPH of schizophrenia. Patient is here with his mother today.   Patient reports he is feeling better sleep with the trazodone estimating 6-8hrs of sleep. Patient reports that his mood has been "pretty good." Patient reports that he lives with his grandmother and mom comes by frequently to take care of the grandmother.  Patient reports he works part time at [employer]. Patient reports that it is going better than before. Patient reports that he is no longer hearing voices at work with the medication. Patient reports he really likes this medication due to lack of AVH. Patient reports that his appetite has increased since starting shot he may have increased 10lbs but he is also working out.  Patient denies SI, HI, an AVH.     Patient reports he is only drinking every 2 weeks. Patient denies THC use.   Devin Davis Grandmother has schizophrenia: She takes Haldol deconoate.  Patient's mother is very verbally supportive. Visit Diagnosis:    ICD-10-CM   1. 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  Family Psychiatric History:  Grandmother: Schizophrenia on Haldol Deconoate  Family History: No family history on file.  Social History:  Social History   Socioeconomic History   Marital status: Single    Spouse name: Not on file   Number of children: Not on file   Years of education: Not on file   Highest education level: Not on file  Occupational  History   Not on file  Tobacco Use   Smoking status: Every Day    Types: E-cigarettes   Smokeless tobacco: Never  Vaping Use   Vaping Use: Never used  Substance and Sexual Activity   Alcohol use: Yes    Alcohol/week: 4.0 standard drinks of alcohol    Types: 2 Cans of beer, 2 Shots of liquor per week    Comment: occ   Drug use: Not Currently   Sexual activity: Not Currently  Other Topics Concern   Not on file  Social History Narrative   Not on file   Social Determinants of Health   Financial Resource Strain: Not on file  Food Insecurity: Not on file  Transportation Needs: Unknown (03/11/2022)   PRAPARE - Hydrologist (Medical): Patient refused    Lack of Transportation (Non-Medical): Not on file  Physical Activity: Not on file  Stress: Not on file  Social Connections: Not on file    Allergies: No Known Allergies  Metabolic Disorder Labs: Lab Results  Component Value Date   HGBA1C 5.8 (H) 03/13/2022   MPG 119.76 03/13/2022   MPG 114.02 10/13/2021   Lab Results  Component Value Date   PROLACTIN 1.8 (L) 10/13/2021   Lab Results  Component Value Date   CHOL 153 03/13/2022   TRIG 65 03/13/2022   HDL 53 03/13/2022   CHOLHDL 2.9 03/13/2022   VLDL 13 03/13/2022  [place] 87 03/13/2022   LDLCALC 63 10/13/2021   Lab Results  Component Value Date   TSH 0.684 03/13/2022   TSH 0.350 10/13/2021    Therapeutic Level Labs: No results found for: "LITHIUM" No results found for: "VALPROATE" No results found for: "CBMZ"  Current Medications: Current Outpatient Medications  Medication Sig Dispense Refill   hydrOXYzine (ATARAX) 25 MG tablet Take 1 tablet (25 mg total) by mouth 3 (three) times daily as needed for anxiety. 45 tablet 0   paliperidone (INVEGA SUSTENNA) 234 MG/1.5ML injection Inject 234 mg into the muscle every 30 (thirty) days. 1.5 mL 0   traZODone (DESYREL) 50 MG tablet Take 1 tablet (50 mg total) by mouth at bedtime as needed  for sleep. 30 tablet 0   No current facility-administered medications for this visit.     Musculoskeletal: Strength & Muscle Tone: within normal limits Gait & Station: normal Patient leans: N/A  Psychiatric Specialty Exam: Review of Systems  Psychiatric/Behavioral:  Negative for dysphoric mood, hallucinations and suicidal ideas.     There were no vitals taken for this visit.There is no height or weight on file to calculate BMI.  General Appearance: Casual  Eye Contact:  Good  Speech:  Clear and Coherent  Volume:  Normal  Mood:  Euthymic  Affect:  Flat  Thought Process:  Coherent  Orientation:  Full (Time, Place, and Person)  Thought Content: Logical   Suicidal Thoughts:  No  Homicidal Thoughts:  No  Memory:  Immediate;   Fair Recent;   Good  Judgement:  Fair  Insight:  Fair  Psychomotor Activity:  Normal  Concentration:  Concentration: Fair  Recall:  NA  Fund of Knowledge: Fair  Language: Good  Akathisia:  No  Handed:    AIMS (if indicated): done  Assets:  Communication Skills Desire for Improvement Housing Resilience Social Support Vocational/Educational  ADL's:  Intact  Cognition: WNL  Sleep:  Good   Screenings: AIMS    Flowsheet Row Admission (Discharged) from 03/11/2022 in [place] [facility] Admission (Discharged) from 10/14/2021 in [place] [facility]  AIMS Total Score 0 0      GAD-7    [place] Office Visit from 04/13/2022 in [facility]  Total GAD-7 Score 4      PHQ2-9    [place] Office Visit from 04/13/2022 in [facility] Counselor from 12/29/2021 in [facility] from 10/13/2021 in [facility] ED from 08/22/2020 in [place]  PHQ-2 Total Score 1 0 2 5  PHQ-9 Total Score -- -- 9 15      Flowsheet Row Admission (Discharged)  from 03/11/2022 in [place] [facility] ED from 03/10/2022 in [facility] Admission (Discharged) from 10/14/2021 in [place] [facility]  C-SSRS RISK CATEGORY No Risk Error: Question 6 not populated No Risk        Assessment and Plan: Devin Davis is a 36 yo patient who presents for follow-up of his [place]. Patient appears to be doing well with cessation of psychosis symptoms. Patient's mother was very verbal throughout appt limiting patient's ability to speak; however, patient did make efforts to speak for himself. Based on mother's hx patient was not compliant with his medications in the past and she believes this led to his decompensation and rehospitalization. Mom endorsed FH of success on LAI's. Patient  appears to be feeling more able to function in society with his medication on board.    Schizophrenia - Continue Kirt Boys 156mg  q28 days IM  Insomnia - continue Trazodone 50mg  QHS PRN  Collaboration of Care: Collaboration of Care:   Patient/Guardian was advised Release of Information must be obtained prior to any record release in order to collaborate their care with an outside provider. Patient/Guardian was advised if they have not already done so to contact the registration department to sign all necessary forms in order for [place] to release information regarding their care.   Consent: Patient/Guardian gives verbal consent for treatment and assignment of benefits for services provided during this visit. Patient/Guardian expressed understanding and agreed to proceed.    Freida Busman, MD 04/13/2022, 3:10 PM

## 2022-04-13 NOTE — Progress Notes (Signed)
PATINET PRESENTS TO THE OFFICE FOR INVEGA SUSTENNA 156 INJECTION GIVEN IN THE RIGHT DELTOID BY Dionis Autry MA , PATIENT DID WELL OVERALL WILL RETURN IN 28 DAYS   

## 2022-05-11 ENCOUNTER — Ambulatory Visit (INDEPENDENT_AMBULATORY_CARE_PROVIDER_SITE_OTHER): Payer: Self-pay | Admitting: *Deleted

## 2022-05-11 ENCOUNTER — Encounter (HOSPITAL_COMMUNITY): Payer: Self-pay

## 2022-05-11 VITALS — BP 109/74 | HR 75 | Ht 69.0 in | Wt 194.0 lb

## 2022-05-11 DIAGNOSIS — F209 Schizophrenia, unspecified: Secondary | ICD-10-CM

## 2022-05-11 MED ORDER — PALIPERIDONE PALMITATE ER 156 MG/ML IM SUSY
156.0000 mg | PREFILLED_SYRINGE | Freq: Once | INTRAMUSCULAR | Status: AC
  Administered 2022-05-11: 156 mg via INTRAMUSCULAR

## 2022-05-11 NOTE — Progress Notes (Signed)
In as planned for his Invega S 156 mg inj. Given today in his L DELTOID. He states he is still having A/H about the same quality as before getting the inj. Today is his 3rd shot. Dr is not available to see him today. Offered him and his mom to be seen tomorrow during clinic time and get his shot today or tomorrow or we can see him in 28 days when his next shot is due if he is comfortable waiting. He is agreeable along with his mom in waiting till next shot appt. Discussed he wont feel full benefit till he has had his third shot. He also states he is moody and tired. Told to bring those questions with him for the provider on his next appt and all would be reviewed. He is due back for his next shot and provider appt in 28 days.

## 2022-06-08 ENCOUNTER — Ambulatory Visit (HOSPITAL_COMMUNITY): Payer: No Payment, Other

## 2022-06-08 ENCOUNTER — Encounter (HOSPITAL_COMMUNITY): Payer: Self-pay

## 2022-06-08 ENCOUNTER — Ambulatory Visit (INDEPENDENT_AMBULATORY_CARE_PROVIDER_SITE_OTHER): Payer: No Payment, Other | Admitting: Psychiatry

## 2022-06-08 VITALS — BP 137/86 | HR 92 | Ht 69.0 in | Wt 199.6 lb

## 2022-06-08 DIAGNOSIS — F209 Schizophrenia, unspecified: Secondary | ICD-10-CM | POA: Diagnosis not present

## 2022-06-08 DIAGNOSIS — F19959 Other psychoactive substance use, unspecified with psychoactive substance-induced psychotic disorder, unspecified: Secondary | ICD-10-CM

## 2022-06-08 MED ORDER — SERTRALINE HCL 25 MG PO TABS: 25.0000 mg | ORAL_TABLET | Freq: Every day | ORAL | 1 refills | Status: DC

## 2022-06-08 MED ORDER — PALIPERIDONE PALMITATE ER 156 MG/ML IM SUSY
156.0000 mg | PREFILLED_SYRINGE | Freq: Once | INTRAMUSCULAR | Status: AC
  Administered 2022-06-08: 156 mg via INTRAMUSCULAR

## 2022-06-08 NOTE — Progress Notes (Signed)
[facility] MD/PA/NP OP Progress Note  06/08/2022 3:08 PM Devin Davis  MRN:  [id number]  Chief Complaint:  Chief Complaint  Patient presents with   Follow-up    LAI   HPI: Devin Davis is a 36 yo patient w/ PPH of schizophrenia. Patient is here with his mother today for LAI  Pt reports that his mood is " depressed". He reports that since starting injection he has been feeling low, depressed, has no motivation and feels slower than usual.  He feels that he feels depressed most of the days.  Mom states patient has been more forgetful, physically and mentally slow and depressed since starting Invega LAI.  Mom reports that it has been working well for his hallucinations.  He reports that last time he had auditory hallucinations was 1 month ago.  He has been sleeping and eating well . Currently, He denies any suicidal ideations, homicidal ideations, auditory and visual hallucinations.  He reports some paranoia and thinks that people are after him.He denies any other side effects from LAI.   He reports no change in his current stressors.  Mom reports that they applied for disability but it has been denied but they will try again. He denies drinking alcohol and denies using any illicit drugs.  He vapes daily. Patient is alert and oriented x 4,  calm, cooperative, and fully engaged in conversation during the encounter. His thought process is coherent with coherent speech . He does not appear to be responding to internal/external stimuli.  PHQ-9 done with patient scored at 18. Dr. Adrian Blackwater talked to patient and mom and examined him. He endorses feeling depressed, slow, sleepy with no motivation and loss of interest in activities.  He reports that he eats green leafy vegetables sometimes and eats meat.  He denies any hair fall. He reports some paranoia and thinks that people are after him and plotting against him.  Mom states sometimes he leaves work because of his paranoia.  He states he gets paranoid  at work too.    AIMS 0, mild stiffness noted in left arm and left leg.   Discussed getting vitamin D level and starting low-dose Zoloft for depression and low motivation.  Discussed risks, benefits and side effects and patient and mom agrees with medication trial.  Recommended to make an appointment with outpatient psychiatrist Dr. Morrie Sheldon. Denies any other concerns.   Visit Diagnosis:    ICD-10-CM   1. Schizophrenia, unspecified type ([facility])  F20.9 sertraline (ZOLOFT) 25 MG tablet    Vitamin D 1,25 dihydroxy    CANCELED: Vitamin D 1,25 dihydroxy      Past Psychiatric History:  Substance induced Psychosis [facility]- 10/2021 [facility]- 03/2022 Zyprexa (failed)  Past Medical History:  Past Medical History:  Diagnosis Date   Fracture of metacarpal of right hand, closed 08/04/2010   Comminuted fx @ base  of [facility]   GERD (gastroesophageal reflux disease)    No past surgical history on file.  Family Psychiatric History:  Grandmother: Schizophrenia on Haldol Deconoate  Family History: No family history on file.  Social History:  Social History   Socioeconomic History   Marital status: Single    Spouse name: Not on file   Number of children: Not on file   Years of education: Not on file   Highest education level: Not on file  Occupational History   Not on file  Tobacco Use   Smoking status: Every Day    Types: E-cigarettes   Smokeless tobacco:  Never  Vaping Use   Vaping Use: Never used  Substance and Sexual Activity   Alcohol use: Yes    Alcohol/week: 4.0 standard drinks of alcohol    Types: 2 Cans of beer, 2 Shots of liquor per week    Comment: occ   Drug use: Not Currently   Sexual activity: Not Currently  Other Topics Concern   Not on file  Social History Narrative   Not on file   Social Determinants of Health   Financial Resource Strain: Not on file  Food Insecurity: Not on file  Transportation Needs: Unknown (03/11/2022)   PRAPARE - Radiographer, therapeutic (Medical): Patient refused    Lack of Transportation (Non-Medical): Not on file  Physical Activity: Not on file  Stress: Not on file  Social Connections: Not on file    Allergies: No Known Allergies  Metabolic Disorder Labs: Lab Results  Component Value Date   HGBA1C 5.8 (H) 03/13/2022   MPG 119.76 03/13/2022   MPG 114.02 10/13/2021   Lab Results  Component Value Date   PROLACTIN 1.8 (L) 10/13/2021   Lab Results  Component Value Date   CHOL 153 03/13/2022   TRIG 65 03/13/2022   HDL 53 03/13/2022   CHOLHDL 2.9 03/13/2022   VLDL 13 03/13/2022   LDLCALC 87 03/13/2022   LDLCALC 63 10/13/2021   Lab Results  Component Value Date   TSH 0.684 03/13/2022   TSH 0.350 10/13/2021    Therapeutic Level Labs: No results found for: "LITHIUM" No results found for: "VALPROATE" No results found for: "CBMZ"  Current Medications: Current Outpatient Medications  Medication Sig Dispense Refill   hydrOXYzine (ATARAX) 25 MG tablet Take 1 tablet (25 mg total) by mouth 3 (three) times daily as needed for anxiety. 45 tablet 0   sertraline (ZOLOFT) 25 MG tablet Take 1 tablet (25 mg total) by mouth daily. 30 tablet 1   traZODone (DESYREL) 50 MG tablet Take 1 tablet (50 mg total) by mouth at bedtime as needed for sleep. 30 tablet 0   No current facility-administered medications for this visit.     Musculoskeletal: Strength & Muscle Tone: within normal limits Gait & Station: normal Patient leans: N/A  Psychiatric Specialty Exam: Review of Systems  Psychiatric/Behavioral:  Negative for dysphoric mood, hallucinations and suicidal ideas.     There were no vitals taken for this visit.There is no height or weight on file to calculate BMI.  General Appearance: Casual  Eye Contact:  Good  Speech:  Clear and Coherent  Volume:  Normal  Mood:  Depressed  Affect:  Flat  Thought Process:  Coherent  Orientation:  Full (Time, Place, and Person)  Thought Content: Paranoid  Ideation   Suicidal Thoughts:  No  Homicidal Thoughts:  No  Memory:  Immediate;   Fair Recent;   Good  Judgement:  Fair  Insight:  Fair  Psychomotor Activity:  Normal  Concentration:  Concentration: Fair  Recall:  NA  Fund of Knowledge: Fair  Language: Good  Akathisia:  No  Handed:    AIMS (if indicated): done  Assets:  Communication Skills Desire for Improvement Housing Resilience Social Support Vocational/Educational  ADL's:  Intact  Cognition: WNL  Sleep:  Good   Screenings: AIMS    Flowsheet Row Admission (Discharged) from 03/11/2022 in [place] [facility] Admission (Discharged) from 10/14/2021 in [place] [facility]  AIMS Total Score 0 0      GAD-7  [place] Office Visit from 04/13/2022 in [facility]  Total GAD-7 Score 4      PHQ2-9    [place] Office Visit from 04/13/2022 in [facility] Counselor from 12/29/2021 in [facility] ED from 10/13/2021 in [facility] ED from 08/22/2020 in [place]  PHQ-2 Total Score 1 0 2 5  PHQ-9 Total Score -- -- 9 15      Flowsheet Row Admission (Discharged) from 03/11/2022 in [place] [facility] ED from 03/10/2022 in [facility] Admission (Discharged) from 10/14/2021 in [place] [facility]  C-SSRS RISK CATEGORY No Risk Error: Question 6 not populated No Risk        Assessment and Plan: Devin Davis is a 36 yo patient who presents for follow-up of his [place].  Patient is reporting depressed mood, low motivation and slowness in thought process.  Mom reports that patient has been slow mentally and physically and is depressed since starting LAI.  Denies SI, HI, AVH.  Still reporting some paranoia. PHQ-9 done with patient scored  at 18. Will get vitamin D level and start  low-dose Zoloft for depression and low motivation. Some stiffness noted on exam in Left upper and lower extremity which will be monitored  at follow up visits.  Patient will follow-up with outpatient psychiatrist Dr Candie Chroman.   Schizophrenia - Continue Kirt Boys 156mg  q28 days IM.  -Start Zoloft 25 mg daily for depression and low motivation.  Risks, benefits and side effects discussed and patient agrees with medication trial.  30-day prescription with 1 refill sent to patient's pharmacy. -Ordered Vitamin D level  Insomnia - continue Trazodone 50mg  QHS PRN  Collaboration of Care: Collaboration of Care: Dr. Nehemiah Settle  Patient/Guardian was advised Release of Information must be obtained prior to any record release in order to collaborate their care with an outside provider. Patient/Guardian was advised if they have not already done so to contact the registration department to sign all necessary forms in order for [place] to release information regarding their care.   Consent: Patient/Guardian gives verbal consent for treatment and assignment of benefits for services provided during this visit. Patient/Guardian expressed understanding and agreed to proceed.    Armando Reichert, MD PGY3 06/08/2022, 3:08 PM

## 2022-06-08 NOTE — Progress Notes (Signed)
  PATINET PRESENTS TO THE OFFICE FOR INVEGA SUSTENNA 156 INJECTION GIVEN IN THE LEFT DELTOID BY Vitaliy Eisenhour MA , PATIENT DID WELL OVERALL WILL RETURN IN 28 DAYS     

## 2022-06-13 ENCOUNTER — Encounter (HOSPITAL_COMMUNITY): Payer: Self-pay | Admitting: Psychiatry

## 2022-06-14 ENCOUNTER — Encounter (HOSPITAL_COMMUNITY): Payer: Self-pay | Admitting: Psychiatry

## 2022-06-14 ENCOUNTER — Ambulatory Visit (INDEPENDENT_AMBULATORY_CARE_PROVIDER_SITE_OTHER): Payer: No Payment, Other | Admitting: Psychiatry

## 2022-06-14 ENCOUNTER — Other Ambulatory Visit: Payer: Self-pay

## 2022-06-14 ENCOUNTER — Other Ambulatory Visit (HOSPITAL_COMMUNITY): Payer: Self-pay

## 2022-06-14 VITALS — BP 130/76 | HR 80 | Ht 69.0 in | Wt 199.0 lb

## 2022-06-14 DIAGNOSIS — F411 Generalized anxiety disorder: Secondary | ICD-10-CM

## 2022-06-14 DIAGNOSIS — F209 Schizophrenia, unspecified: Secondary | ICD-10-CM

## 2022-06-14 DIAGNOSIS — Z0000 Encounter for general adult medical examination without abnormal findings: Secondary | ICD-10-CM

## 2022-06-14 MED ORDER — HYDROXYZINE HCL 25 MG PO TABS
25.0000 mg | ORAL_TABLET | Freq: Three times a day (TID) | ORAL | 3 refills | Status: DC | PRN
  Filled 2022-06-14: qty 90, 30d supply, fill #0

## 2022-06-14 MED ORDER — SERTRALINE HCL 25 MG PO TABS
25.0000 mg | ORAL_TABLET | Freq: Every day | ORAL | 3 refills | Status: DC
  Filled 2022-06-14: qty 30, 30d supply, fill #0

## 2022-06-14 MED ORDER — TRAZODONE HCL 50 MG PO TABS
50.0000 mg | ORAL_TABLET | Freq: Every evening | ORAL | 3 refills | Status: DC | PRN
  Filled 2022-06-14: qty 30, 30d supply, fill #0

## 2022-06-14 NOTE — Progress Notes (Signed)
BH MD/PA/NP OP Progress Note  06/14/2022 3:47 PM Devin Davis  MRN:  [id number]  Chief Complaint: " I want to discuss my side effects" He is mentally stable but very slow"  HPI: 36 year old male seen today for follow-up psychiatric evaluation.  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  Currently he is managed on hydroxyzine 25 mg 3 times daily as needed, Zoloft 25 mg daily, trazodone 50 mg nightly as needed, and Invega Sustenna 156 mg monthly.  Patient informed writer that his medications are somewhat effective in managing his psychiatric conditions.  Today he is well-groomed, pleasant, cooperative, and engaged in conversation.  He informed Clinical research associate that he wanted to discuss his side effects from his Invega injection.  Patient notes that 2 weeks after taking Invega he continues to be fatigued.  He notes that his fatigue is interfering with his activities of daily living.  He informed Clinical research associate that on several occasions he has had to take time off of work due to his increased fatigue.  Patient works at [employer].  Patient was recently seen on 06/08/2022 and was started on Zoloft to help manage depression.  Provider informed patient that Zoloft takes approximately 4 to 6 weeks to work and fatigue may improve once the medication is effectively given his system.  He endorsed understanding and agreed.  Provider informed patient that if sedation does not improve and another LAI could be trialed but recommended trying his antidepressant first and following up with PCP for physical.  He endorsed understanding and agreed.  Patient does note that he likes [place].  He notes that his voices are less frequent.  He reports that a few weeks ago he heard his voice today quit playing with me.  Patient denies visual hallucinations or paranoia.  Patient also reports that he is not engaging in illegal substances  or alcohol.  Today provider conducted a GAD-7 and patient scored a 15.  Provider also conducted PHQ-9 patient scored a 16.  He endorses sleeping 12 or more hours nightly.  Today he denies SI/HI/VH or paranoia.  Patient was seen with his mother who notes that mentally he is doing better however notes that his thought process and movements are slower.  She also notes that he is more fatigued.  Provider discussed antidepressant usefulness with helping depressive symptoms and potentially improving fatigue.  Provider also discussed patient seeing a PCP.  She endorsed understanding and agreed.  No medication changes made today.  Patient agreeable to continue medications as prescribed. Patient referred to community health and wellness for primary care. No other concerns at this time. Visit Diagnosis:    ICD-10-CM   1. Well adult health check  Z00.00 Ambulatory referral to Internal Medicine    hydrOXYzine (ATARAX) 25 MG tablet    2. Schizophrenia, unspecified type (HCC)  F20.9 sertraline (ZOLOFT) 25 MG tablet    traZODone (DESYREL) 50 MG tablet    3. Generalized anxiety disorder  F41.1 hydrOXYzine (ATARAX) 25 MG tablet      Past Psychiatric History: Amphetamine psychostimulant induced psychosis with delusions, substance-induced psychotic disorder, psychosis, schizophrenia, and tobacco use  Past Medical History:  Past Medical History:  Diagnosis Date   Fracture of metacarpal of right hand, closed 08/04/2010   Comminuted fx @ base  of [facility]   GERD (gastroesophageal reflux disease)    History reviewed. No pertinent surgical history.  Family Psychiatric History: Maternal grandmother schizophrenia, maternal  aunt maternal uncle schizophrenia  Family History: History reviewed. No pertinent family history.  Social History:  Social History   Socioeconomic History   Marital status: Single    Spouse name: Not on file   Number of children: Not on file   Years of education: Not on file   Highest  education level: Not on file  Occupational History   Not on file  Tobacco Use   Smoking status: Every Day    Types: E-cigarettes   Smokeless tobacco: Never  Vaping Use   Vaping Use: Never used  Substance and Sexual Activity   Alcohol use: Yes    Alcohol/week: 4.0 standard drinks of alcohol    Types: 2 Cans of beer, 2 Shots of liquor per week    Comment: occ   Drug use: Not Currently   Sexual activity: Not Currently  Other Topics Concern   Not on file  Social History Narrative   Not on file   Social Determinants of Health   Financial Resource Strain: Not on file  Food Insecurity: Not on file  Transportation Needs: Unknown (03/11/2022)   PRAPARE - Administrator, Civil Service (Medical): Patient refused    Lack of Transportation (Non-Medical): Not on file  Physical Activity: Not on file  Stress: Not on file  Social Connections: Not on file    Allergies: No Known Allergies  Metabolic Disorder Labs: Lab Results  Component Value Date   HGBA1C 5.8 (H) 03/13/2022   MPG 119.76 03/13/2022   MPG 114.02 10/13/2021   Lab Results  Component Value Date   PROLACTIN 1.8 (L) 10/13/2021   Lab Results  Component Value Date   CHOL 153 03/13/2022   TRIG 65 03/13/2022   HDL 53 03/13/2022   CHOLHDL 2.9 03/13/2022   VLDL 13 03/13/2022   LDLCALC 87 03/13/2022   LDLCALC 63 10/13/2021   Lab Results  Component Value Date   TSH 0.684 03/13/2022   TSH 0.350 10/13/2021    Therapeutic Level Labs: No results found for: "LITHIUM" No results found for: "VALPROATE" No results found for: "CBMZ"  Current Medications: Current Outpatient Medications  Medication Sig Dispense Refill   hydrOXYzine (ATARAX) 25 MG tablet Take 1 tablet (25 mg total) by mouth 3 (three) times daily as needed for anxiety. 90 tablet 3   sertraline (ZOLOFT) 25 MG tablet Take 1 tablet (25 mg total) by mouth daily. 30 tablet 3   traZODone (DESYREL) 50 MG tablet Take 1 tablet (50 mg total) by mouth at  bedtime as needed for sleep. 30 tablet 3   No current facility-administered medications for this visit.     Musculoskeletal: Strength & Muscle Tone: within normal limits Gait & Station: normal Patient leans: N/A  Psychiatric Specialty Exam: Review of Systems  Blood pressure 130/76, pulse 80, height 5\' 9"  (1.753 m), weight 199 lb (90.3 kg), SpO2 100 %.Body mass index is 29.39 kg/m.  General Appearance: Well Groomed  Eye Contact:  Good  Speech:  Clear and Coherent and Normal Rate  Volume:  Normal  Mood:  Anxious and Depressed  Affect:  Appropriate and Congruent  Thought Process:  Coherent, Goal Directed, and Linear  Orientation:  Full (Time, Place, and Person)  Thought Content: Logical and Hallucinations: Auditory   Suicidal Thoughts:  No  Homicidal Thoughts:  No  Memory:  Immediate;   Good Recent;   Good Remote;   Good  Judgement:  Good  Insight:  Good  Psychomotor Activity:  Normal  Concentration:  Concentration: Good and Attention Span: Good  Recall:  Good  Fund of Knowledge: Good  Language: Good  Akathisia:  No  Handed:  Right  AIMS (if indicated): not done  Assets:  Communication Skills Desire for Improvement Financial Resources/Insurance Housing Physical Health Social Support Talents/Skills  ADL's:  Intact  Cognition: WNL  Sleep:  Fair   Screenings: AIMS    Flowsheet Row [facility]  AIMS Total Score 0 0      GAD-7    Flowsheet Row Clinical Support from 06/14/2022 in [facility] Office Visit from 04/13/2022 in [facility]  Total GAD-7 Score 15 4      PHQ2-9    Flowsheet Row Clinical Support from 06/14/2022 in [facility] Office Visit from 06/08/2022 in [facility] Office  Visit from 04[facility]13/2022 in [facility] Counselor from 12/29/2021 in [facility] ED from 10/13/2021 in [facility]  PHQ-2 Total Score 4 5 1  0 2  PHQ-9 Total Score 16 18 -- -- 9      Flowsheet Row Clinical Support from 06/14/2022 in [facility] Admission (Discharged[facility] from 03/10/2022 in [facility]  C-SSRS RISK CATEGORY Error: Q7 should not be populated when Q6 is No No Risk Error: Question 6 not populated        Assessment and Plan: Patient endorses hypersomnia, anxiety, and depression.  Patient recently started on Zoloft.  Provider informed patient that Zoloft will take 4 to 6 weeks to fully work.Provider discussed antidepressant usefulness with helping depressive symptoms and potentially improving fatigue.  Provider also discussed patient seeing a PCP.  She endorsed understanding and agreed.  No medication changes made today.  Patient agreeable to continue medications as prescribed.  Patient referred to community health and wellness for primary care  1. Schizophrenia, unspecified type (HCC)  Continue- sertraline (ZOLOFT) 25 MG tablet; Take 1 tablet (25 mg total) by mouth daily.  Dispense: 30 tablet; Refill: 3 Continue- traZODone (DESYREL) 50 MG tablet; Take 1 tablet (50 mg total) by mouth at bedtime as needed for sleep.  Dispense: 30 tablet; Refill: 3  2. Well adult health check  - Ambulatory referral to Internal Medicine Continue- hydrOXYzine (ATARAX) 25 MG tablet; Take 1 tablet (25 mg total) by mouth 3 (three) times daily as needed for anxiety.  Dispense: 90 tablet; Refill: 3  3. Generalized anxiety disorder  Continue- hydrOXYzine (ATARAX) 25 MG tablet; Take 1 tablet (25 mg total) by mouth 3 (three) times daily as needed for anxiety.  Dispense: 90 tablet; Refill: 3  Collaboration of  Care: Collaboration of Care: Other provider involved in patient's care AEB PCP  Patient/Guardian was advised Release of Information must be obtained prior to any record release in order to collaborate their care with an outside provider. Patient/Guardian was advised if they have not already done so to contact the registration department to sign all necessary forms in order for [facility] to release information regarding their care.   Consent: Patient/Guardian gives verbal consent for treatment and assignment of benefits for services provided during this visit. Patient/Guardian expressed understanding and agreed to proceed.   Follow-up in 3 months [place], NP 06/14/2022, 3:47 PM

## 2022-06-16 ENCOUNTER — Other Ambulatory Visit: Payer: Self-pay

## 2022-07-06 ENCOUNTER — Ambulatory Visit (INDEPENDENT_AMBULATORY_CARE_PROVIDER_SITE_OTHER): Payer: No Payment, Other | Admitting: Psychiatry

## 2022-07-06 ENCOUNTER — Ambulatory Visit (HOSPITAL_COMMUNITY): Payer: Self-pay | Admitting: *Deleted

## 2022-07-06 ENCOUNTER — Other Ambulatory Visit: Payer: Self-pay

## 2022-07-06 ENCOUNTER — Encounter (HOSPITAL_COMMUNITY): Payer: Self-pay

## 2022-07-06 VITALS — BP 126/76 | HR 74 | Resp 16 | Ht 69.0 in | Wt 198.6 lb

## 2022-07-06 DIAGNOSIS — F209 Schizophrenia, unspecified: Secondary | ICD-10-CM | POA: Diagnosis not present

## 2022-07-06 MED ORDER — BUPROPION HCL ER (XL) 150 MG PO TB24
150.0000 mg | ORAL_TABLET | ORAL | 1 refills | Status: DC
  Filled 2022-07-06: qty 30, 30d supply, fill #0

## 2022-07-06 MED ORDER — PALIPERIDONE PALMITATE ER 156 MG/ML IM SUSY
156.0000 mg | PREFILLED_SYRINGE | INTRAMUSCULAR | Status: AC
  Administered 2022-07-06 – 2023-08-24 (×14): 156 mg via INTRAMUSCULAR

## 2022-07-06 NOTE — Progress Notes (Signed)
BH MD/PA/NP OP Progress Note  07/06/2022 2:49 PM [name]  MRN:  [id number]  Chief Complaint:  Chief Complaint  Patient presents with   Follow-up    LAI   HPI: Devin Davis is a 37 yo patient w/ PPH of schizophrenia. Patient is here with his mother today for LAI  Pt reports that his mood is " better". He reports that Zoloft helps with depression but he has been experiencing erectile dysfunction and do not want to continue.  He is still reporting fatigue. Mom reports that patient does not sleep at night and sleeps all day.  Patient has not been taking trazodone.  Discussed to continue trazodone at night to help with insomnia. Discussed sleep hygiene. He denies any h/o seizure.  Discussed stopping Zoloft due to sexual side effects and starting Wellbutrin for depression and fatigue. He reports stable appetite. Currently, He denies any suicidal ideations, homicidal ideations, auditory and visual hallucinations.  He denies paranoia.He denies any side effects from LAI and has been tolerating well AIMS -0 PHQ-9 done with patient scored at 10.  Visit Diagnosis:    ICD-10-CM   1. Schizophrenia, unspecified type ([place])  F20.9       Past Psychiatric History:  Substance induced Psychosis [place]- 10/2021 [place]- 03/2022 Zyprexa (failed)  Past Medical History:  Past Medical History:  Diagnosis Date   Fracture of metacarpal of right hand, closed 08/04/2010   Comminuted fx @ base  of [facility]   GERD (gastroesophageal reflux disease)    No past surgical history on file.  Family Psychiatric History:  Grandmother: Schizophrenia on Haldol Deconoate  Family History: No family history on file.  Social History:  Social History   Socioeconomic History   Marital status: Single    Spouse name: Not on file   Number of children: Not on file   Years of education: Not on file   Highest education level: Not on file  Occupational History   Not on file  Tobacco Use   Smoking status: Every  Day    Types: E-cigarettes   Smokeless tobacco: Never  Vaping Use   Vaping Use: Never used  Substance and Sexual Activity   Alcohol use: Yes    Alcohol/week: 4.0 standard drinks of alcohol    Types: 2 Cans of beer, 2 Shots of liquor per week    Comment: occ   Drug use: Not Currently   Sexual activity: Not Currently  Other Topics Concern   Not on file  Social History Narrative   Not on file   Social Determinants of Health   Financial Resource Strain: Not on file  Food Insecurity: Not on file  Transportation Needs: Unknown (03/11/2022)   PRAPARE - Hydrologist (Medical): Patient refused    Lack of Transportation (Non-Medical): Not on file  Physical Activity: Not on file  Stress: Not on file  Social Connections: Not on file    Allergies: No Known Allergies  Metabolic Disorder Labs: Lab Results  Component Value Date   HGBA1C 5.8 (H) 03/13/2022   MPG 119.76 03/13/2022   MPG 114.02 10/13/2021   Lab Results  Component Value Date   PROLACTIN 1.8 (L) 10/13/2021   Lab Results  Component Value Date   CHOL 153 03/13/2022   TRIG 65 03/13/2022   HDL 53 03/13/2022   CHOLHDL 2.9 03/13/2022   VLDL 13 03/13/2022   LDLCALC 87 03/13/2022   LDLCALC 63 10/13/2021   Lab Results  Component  Value Date   TSH 0.684 03/13/2022   TSH 0.350 10/13/2021    Therapeutic Level Labs: No results found for: "LITHIUM" No results found for: "VALPROATE" No results found for: "CBMZ"  Current Medications: Current Outpatient Medications  Medication Sig Dispense Refill   hydrOXYzine (ATARAX) 25 MG tablet Take 1 tablet (25 mg total) by mouth 3 (three) times daily as needed for anxiety. 90 tablet 3   traZODone (DESYREL) 50 MG tablet Take 1 tablet (50 mg total) by mouth at bedtime as needed for sleep. 30 tablet 3   Current Facility-Administered Medications  Medication Dose Route Frequency Provider Last Rate Last Admin   paliperidone (INVEGA SUSTENNA) injection 156 mg   156 mg Intramuscular Q28 days Armando Reichert, MD   156 mg at 07/06/22 1442     Musculoskeletal: Strength & Muscle Tone: within normal limits Gait & Station: normal Patient leans: N/A  Psychiatric Specialty Exam: Review of Systems  Psychiatric/Behavioral:  Negative for dysphoric mood, hallucinations and suicidal ideas.     There were no vitals taken for this visit.There is no height or weight on file to calculate BMI.  General Appearance: Casual  Eye Contact:  Good  Speech:  Clear and Coherent  Volume:  Normal  Mood:   better  Affect:  Constricted  Thought Process:  Coherent  Orientation:  Full (Time, Place, and Person)  Thought Content: Logical   Suicidal Thoughts:  No  Homicidal Thoughts:  No  Memory:  Immediate;   Fair Recent;   Good  Judgement:  Fair  Insight:  Fair  Psychomotor Activity:  Normal  Concentration:  Concentration: Fair  Recall:  NA  Fund of Knowledge: Fair  Language: Good  Akathisia:  No  Handed:    AIMS (if indicated): done  Assets:  Communication Skills Desire for Improvement Housing Resilience Social Support Vocational/Educational  ADL's:  Intact  Cognition: WNL  Sleep:  Good   Screenings: AIMS    Flowsheet Row Admission (Discharged) from 03/11/2022 in [place] [facility] Admission (Discharged) from 10/14/2021 in [place] [facility]  AIMS Total Score 0 0      GAD-7    Flowsheet Row Clinical Support from 06/14/2022 in [facility] Office Visit from 04/13/2022 in [facility]  Total GAD-7 Score 15 4      PHQ2-9    [place] Office Visit from 07/06/2022 in [place] from 06/14/2022 in [facility] Office Visit from 06/08/2022 in [facility] Office Visit from 04/13/2022 in [facility] Counselor from  12/29/2021 in [facility]  PHQ-2 Total Score 3 4 5 1  0  PHQ-9 Total Score 10 16 18  -- --      Flowsheet Row Clinical Support from 06/14/2022 in [facility] Admission (Discharged) from 03/11/2022 in [place] [facility] ED from 03/10/2022 in [place] Error: Q7 should not be populated when Q6 is No No Risk Error: Question 6 not populated        Assessment and Plan: Devin Davis is a 37 yo patient who presents for follow-up of his [place].  Patient is reporting improvement in his mood with Zoloft but reporting erectile dysfunction. Does not want to continue Zoloft.  PHQ-9 done with patient scored at 10.  Will start Wellbutrin to help with depression and  fatigue.  Patient will follow-up with outpatient provider Sherril Cong in Feb   Schizophrenia - Continue Hinda Glatter Sustenna 156mg  q28 days IM.  -Stop Zoloft due to sexual side effects.  -Start Wellbutrin 150 mg XL daily for depression.  30-day prescription with 1 refill sent to patient's pharmacy.  Insomnia - continue Trazodone 50mg  QHS PRN - Discussed sleep hygiene  F/u appointment with OP provider on 2/20  Collaboration of Care: Collaboration of Care: Dr.  Patient/Guardian was advised Release of Information must be obtained prior to any record release in order to collaborate their care with an outside provider. Patient/Guardian was advised if they have not already done so to contact the registration department to sign all necessary forms in order for 3/20 to release information regarding their care.   Consent: Patient/Guardian gives verbal consent for treatment and assignment of benefits for services provided during this visit. Patient/Guardian expressed understanding and agreed to proceed.    Adrian Blackwater, MD PGY3 07/06/2022, 2:49 PM

## 2022-07-06 NOTE — Progress Notes (Signed)
Patient arrived for injection of Invega Sustenna 156mg . Spoke with Dr. Rosita Kea prior to injection with concerns about sexual side effects. Denies any other issues or complaints. Pleasant and cooperative. Given in Right Deltoid. Denies SI/HI or AV hallucinations.

## 2022-07-12 ENCOUNTER — Encounter (HOSPITAL_COMMUNITY): Payer: Self-pay | Admitting: Psychiatry

## 2022-08-03 ENCOUNTER — Encounter (HOSPITAL_COMMUNITY): Payer: Self-pay | Admitting: Registered Nurse

## 2022-08-03 ENCOUNTER — Ambulatory Visit (HOSPITAL_COMMUNITY): Payer: Self-pay

## 2022-08-03 ENCOUNTER — Encounter (HOSPITAL_COMMUNITY): Payer: Self-pay

## 2022-08-03 VITALS — BP 123/70 | HR 79 | Resp 16 | Ht 69.0 in | Wt 204.0 lb

## 2022-08-03 DIAGNOSIS — F411 Generalized anxiety disorder: Secondary | ICD-10-CM

## 2022-08-03 DIAGNOSIS — F19959 Other psychoactive substance use, unspecified with psychoactive substance-induced psychotic disorder, unspecified: Secondary | ICD-10-CM

## 2022-08-03 DIAGNOSIS — Z0000 Encounter for general adult medical examination without abnormal findings: Secondary | ICD-10-CM

## 2022-08-03 DIAGNOSIS — F250 Schizoaffective disorder, bipolar type: Secondary | ICD-10-CM

## 2022-08-03 DIAGNOSIS — F209 Schizophrenia, unspecified: Secondary | ICD-10-CM

## 2022-08-03 NOTE — Progress Notes (Deleted)
Patient ID: Devin Davis, male   DOB: [birth year], 37 y.o.   MRN: [id number]  Patient in office today for administration of long acting injectable [place].  He is accompanied by his mother.   While in office asked if he could speak to a provider related to the continued symptoms of fatigue that he feels is a side effect of the [place].    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  He is currently managed on hydroxyzine 25 mg 3 times daily as needed, Zoloft 25 mg daily, trazodone 50 mg nightly as needed, and Invega Sustenna 156 mg monthly.   He reports that he doesn't take the hydroxyzine daily only as needed.  States that he doesn't take his Trazodone any later than 9:00 pm sot that it doesn't make him feel more fatigued the next day.  He reports that he did not start feeling fatigued "like I am now until I started getting the shot."  His mother reports that [place] is drinking 5 to 6 Monster caffeine drinks daily.  "I feel like that's got something to do with his sleep and feeling tired."  Patient states that on some days he may drink 2 or 3 and other days will drink up to 5 or 6 throughout the day.  Educated on caffeine intake and side effects: too much caffeine can make it difficult to get enough restorative sleep.  High caffeine consumption can increase the amount of time it takes to fall asleep and can decrease total sleep time.  Caffeine usually boosts energy but can have the opposite effect causing rebound fatigue when the caffeine leaves your system.  Caffeine withdrawal can last anywhere from 2 to 9 days depending on the individual and the amount of caffeine consumption.   Discussed gradually decreasing caffeine intake (for example if you are drinking 6 Monster cans a day go down to 5 one or 2 days then to 4 and continue process until not taking in as much).   Patient also encouraged to keep a diary of caffeine intake and how he felt that day.  (on days that he is having high consumption is most likely days having difficulty falling to sleep "can't fall sleep till 5 or 6 in morning and can't get up next day" On the days that you are consuming less are the days that you are feeling fatigued, tired, not want to get up or go out {rebound}) Patient states that he has a follow up visit 08/23/2022 encouraged to cut back on caffeine and discuss progress at next visit before stopping his [place].  He mother is in agreement and feels that all symptoms are coming from caffeine intake.   Khara Renaud B. Wilian Kwong, NP

## 2022-08-03 NOTE — Progress Notes (Unsigned)
Patient ID: Devin Davis, male   DOB: 11/12/1985, 36 y.o.   MRN: [id number]  Patient in office today for administration of long acting injectable Invega Sustenna.  He is accompanied by his mother.   While in office asked if he could speak to a provider related to the continued symptoms of fatigue that he feels is a side effect of the Invega Sustenna.    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  He is currently managed on hydroxyzine 25 mg 3 times daily as needed, Zoloft 25 mg daily, trazodone 50 mg nightly as needed, and Invega Sustenna 156 mg monthly.   He reports that he doesn't take the hydroxyzine daily only as needed.  States that he doesn't take his Trazodone any later than 9:00 pm sot that it doesn't make him feel more fatigued the next day.  He reports that he did not start feeling fatigued "like I am now until I started getting the shot."  His mother reports that Yassine is drinking 5 to 6 Monster caffeine drinks daily.  "I feel like that's got something to do with his sleep and feeling tired."  Patient states that on some days he may drink 2 or 3 and other days will drink up to 5 or 6 throughout the day.  Educated on caffeine intake and side effects: too much caffeine can make it difficult to get enough restorative sleep.  High caffeine consumption can increase the amount of time it takes to fall asleep and can decrease total sleep time.  Caffeine usually boosts energy but can have the opposite effect causing rebound fatigue when the caffeine leaves your system.  Caffeine withdrawal can last anywhere from 2 to 9 days depending on the individual and the amount of caffeine consumption.   Discussed gradually decreasing caffeine intake (for example if you are drinking 6 Monster cans a day go down to 5 one or 2 days then to 4 and continue process until not taking in as much).   Patient also encouraged to keep a diary of caffeine intake and how he felt that day.  (on days that he is having high consumption is most likely days having difficulty falling to sleep "can't fall sleep till 5 or 6 in morning and can't get up next day" On the days that you are consuming less are the days that you are feeling fatigued, tired, not want to get up or go out {rebound}) Patient states that he has a follow up visit 08/23/2022 encouraged to cut back on caffeine and discuss progress at next visit before stopping his Invega Sustenna.  He mother is in agreement and feels that all symptoms are coming from caffeine intake.   Selma Rodelo B. Alesa Echevarria, NP  

## 2022-08-03 NOTE — Progress Notes (Deleted)
Patient ID: Devin Davis, male   DOB: 09/03/1985, 36 y.o.   MRN: [id number]  Patient in office today for administration of long acting injectable Invega Sustenna.  He is accompanied by his mother.   While in office asked if he could speak to a provider related to the continued symptoms of fatigue that he feels is a side effect of the Invega Sustenna.    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  He is currently managed on hydroxyzine 25 mg 3 times daily as needed, Zoloft 25 mg daily, trazodone 50 mg nightly as needed, and Invega Sustenna 156 mg monthly.   He reports that he doesn't take the hydroxyzine daily only as needed.  States that he doesn't take his Trazodone any later than 9:00 pm sot that it doesn't make him feel more fatigued the next day.  He reports that he did not start feeling fatigued "like I am now until I started getting the shot."  His mother reports that Devin Davis is drinking 5 to 6 Monster caffeine drinks daily.  "I feel like that's got something to do with his sleep and feeling tired."  Patient states that on some days he may drink 2 or 3 and other days will drink up to 5 or 6 throughout the day.  Educated on caffeine intake and side effects: too much caffeine can make it difficult to get enough restorative sleep.  High caffeine consumption can increase the amount of time it takes to fall asleep and can decrease total sleep time.  Caffeine usually boosts energy but can have the opposite effect causing rebound fatigue when the caffeine leaves your system.  Caffeine withdrawal can last anywhere from 2 to 9 days depending on the individual and the amount of caffeine consumption.   Discussed gradually decreasing caffeine intake (for example if you are drinking 6 Monster cans a day go down to 5 one or 2 days then to 4 and continue process until not taking in as much).   Patient also encouraged to keep a diary of caffeine intake and how he felt that day.  (on days that he is having high consumption is most likely days having difficulty falling to sleep "can't fall sleep till 5 or 6 in morning and can't get up next day" On the days that you are consuming less are the days that you are feeling fatigued, tired, not want to get up or go out {rebound}) Patient states that he has a follow up visit 08/23/2022 encouraged to cut back on caffeine and discuss progress at next visit before stopping his Invega Sustenna.  He mother is in agreement and feels that all symptoms are coming from caffeine intake.   Devin Violet B. Eryx Zane, NP  

## 2022-08-23 ENCOUNTER — Other Ambulatory Visit: Payer: Self-pay

## 2022-08-23 ENCOUNTER — Encounter (HOSPITAL_COMMUNITY): Payer: Self-pay | Admitting: Psychiatry

## 2022-08-23 ENCOUNTER — Ambulatory Visit (INDEPENDENT_AMBULATORY_CARE_PROVIDER_SITE_OTHER): Payer: Medicaid Other | Admitting: Psychiatry

## 2022-08-23 DIAGNOSIS — F209 Schizophrenia, unspecified: Secondary | ICD-10-CM | POA: Diagnosis not present

## 2022-08-23 DIAGNOSIS — Z0000 Encounter for general adult medical examination without abnormal findings: Secondary | ICD-10-CM

## 2022-08-23 DIAGNOSIS — F411 Generalized anxiety disorder: Secondary | ICD-10-CM | POA: Diagnosis not present

## 2022-08-23 MED ORDER — BUPROPION HCL ER (XL) 150 MG PO TB24
150.0000 mg | ORAL_TABLET | ORAL | 1 refills | Status: DC
  Filled 2022-08-23: qty 21, 21d supply, fill #0
  Filled 2022-08-23 – 2022-09-14 (×2): qty 30, 30d supply, fill #0
  Filled 2022-10-18 – 2022-10-26 (×2): qty 30, 30d supply, fill #1

## 2022-08-23 MED ORDER — TRAZODONE HCL 50 MG PO TABS
50.0000 mg | ORAL_TABLET | Freq: Every evening | ORAL | 3 refills | Status: DC | PRN
  Filled 2022-08-23 – 2022-09-14 (×2): qty 30, 30d supply, fill #0

## 2022-08-23 MED ORDER — HYDROXYZINE HCL 25 MG PO TABS
25.0000 mg | ORAL_TABLET | Freq: Three times a day (TID) | ORAL | 3 refills | Status: DC | PRN
  Filled 2022-08-23 – 2022-09-14 (×2): qty 90, 30d supply, fill #0
  Filled 2022-10-18 – 2022-10-26 (×2): qty 90, 30d supply, fill #1

## 2022-08-23 MED ORDER — INVEGA SUSTENNA 156 MG/ML IM SUSY: 156.0000 mg | PREFILLED_SYRINGE | INTRAMUSCULAR | 11 refills | Status: DC

## 2022-08-23 NOTE — Progress Notes (Signed)
[place] MD/PA/NP OP Progress Note  08/23/2022 3:56 PM Devin Davis  MRN:  [id number]  Chief Complaint: "I have erectile dysfunction" "I think he is stressed"  HPI: 37 year old male seen today for follow-up psychiatric evaluation.  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  Currently he is managed on hydroxyzine 25 mg 3 times daily as needed, Wellbutrin 150 mg daily, trazodone 50 mg nightly as needed, and Invega Sustenna 156 mg monthly.  Patient informed writer that his medications are somewhat effective in managing his psychiatric conditions.  Today he is well-groomed, pleasant, cooperative, and engaged in conversation.  He informed Probation officer that he continues to have erectile dysfunction and notes that he believes it is [place] that is causing it.  Provider informed patient that [name] could potentially cause erectile dysfunction and informed him that if he wants to try oral pills or switch to a different antipsychotic he could be considered.  Patient was seen with his mother who notes that she believes that his erectile dysfunction as well as schizophrenia is stress-induced.  Provider educated patient and his mother on potential causes for schizophrenia as well as erectile dysfunction.  They endorsed understanding.  Provider informed patient that an oral form of the medication can be taken and tapered if needed.  He reports that at this time he would like to continue his current dose of Invega.  Patient informed Probation officer that he continues to have depressive episodes.  He notes that he will have the drive to do something and then will become depressed and be unable to complete task.  He informed Probation officer that he would like to get back to his self.  Today provider conducted GAD-7 and patient scored a 9, at his last visit he scored a 15.  Provider also conducted PHQ-9 patient scored a 10, at his last  visit he scored a 62.  He endorses adequate sleep and appetite.  Today he denies SI/HI/VH, mania, paranoia.  Provider recommended increasing Wellbutrin to help manage depressive symptoms however patient was not agreeable.  At this time he does not wish to lower Invega but will continue all medications as prescribed.  Provider informed patient that another antipsychotic could be trialed in the future if he continues to have sexual dysfunction.  Patient also sees primary care doctor in March and provider recommended him discussing treatment for erectile dysfunction.  He endorsed understanding and agreement.  No other concerns at this time.  Visit Diagnosis:    ICD-10-CM   1. Schizophrenia, unspecified type ([place])  F20.9 buPROPion (WELLBUTRIN XL) 150 MG 24 hr tablet    traZODone (DESYREL) 50 MG tablet    paliperidone (INVEGA SUSTENNA) 156 MG/ML SUSY injection    2. Generalized anxiety disorder  F41.1 hydrOXYzine (ATARAX) 25 MG tablet    3. Well adult health check  Z00.00 hydrOXYzine (ATARAX) 25 MG tablet       Past Psychiatric History: Amphetamine psychostimulant induced psychosis with delusions, substance-induced psychotic disorder, psychosis, schizophrenia, and tobacco use  Past Medical History:  Past Medical History:  Diagnosis Date   Fracture of metacarpal of right hand, closed 08/04/2010   Comminuted fx @ [facility]   GERD (gastroesophageal reflux disease)    No past surgical history on file.  Family Psychiatric History: Maternal grandmother schizophrenia, maternal aunt maternal uncle schizophrenia  Family History: No family history on file.  Social History:  Social History   Socioeconomic History  Marital status: Single    Spouse name: Not on file   Number of children: Not on file   Years of education: Not on file   Highest education level: Not on file  Occupational History   Not on file  Tobacco Use   Smoking status: Every Day    Types: E-cigarettes   Smokeless  tobacco: Never  Vaping Use   Vaping Use: Never used  Substance and Sexual Activity   Alcohol use: Yes    Alcohol/week: 4.0 standard drinks of alcohol    Types: 2 Cans of beer, 2 Shots of liquor per week    Comment: occ   Drug use: Not Currently   Sexual activity: Not Currently  Other Topics Concern   Not on file  Social History Narrative   Not on file   Social Determinants of Health   Financial Resource Strain: Not on file  Food Insecurity: Not on file  Transportation Needs: Unknown (03/11/2022)   PRAPARE - Hydrologist (Medical): Patient refused    Lack of Transportation (Non-Medical): Not on file  Physical Activity: Not on file  Stress: Not on file  Social Connections: Not on file    Allergies: No Known Allergies  Metabolic Disorder Labs: Lab Results  Component Value Date   HGBA1C 5.8 (H) 03/13/2022   MPG 119.76 03/13/2022   MPG 114.02 10/13/2021   Lab Results  Component Value Date   PROLACTIN 1.8 (L) 10/13/2021   Lab Results  Component Value Date   CHOL 153 03/13/2022   TRIG 65 03/13/2022   HDL 53 03/13/2022   CHOLHDL 2.9 03/13/2022   VLDL 13 03/13/2022   LDLCALC 87 03/13/2022   LDLCALC 63 10/13/2021   Lab Results  Component Value Date   TSH 0.684 03/13/2022   TSH 0.350 10/13/2021    Therapeutic Level Labs: No results found for: "LITHIUM" No results found for: "VALPROATE" No results found for: "CBMZ"  Current Medications: Current Outpatient Medications  Medication Sig Dispense Refill   paliperidone (INVEGA SUSTENNA) 156 MG/ML SUSY injection Inject 1 mL (156 mg total) into the muscle every 28 (twenty-eight) days. 1 mL 11   buPROPion (WELLBUTRIN XL) 150 MG 24 hr tablet Take 1 tablet (150 mg total) by mouth every morning. 30 tablet 1   hydrOXYzine (ATARAX) 25 MG tablet Take 1 tablet (25 mg total) by mouth 3 (three) times daily as needed for anxiety. 90 tablet 3   traZODone (DESYREL) 50 MG tablet Take 1 tablet (50 mg total)  by mouth at bedtime as needed for sleep. 30 tablet 3   Current Facility-Administered Medications  Medication Dose Route Frequency Provider Last Rate Last Admin   paliperidone (INVEGA SUSTENNA) injection 156 mg  156 mg Intramuscular Q28 days Armando Reichert, MD   156 mg at 08/03/22 1440     Musculoskeletal: Strength & Muscle Tone: within normal limits Gait & Station: normal Patient leans: N/A  Psychiatric Specialty Exam: Review of Systems  Blood pressure 125/76, pulse 80, temperature 98.1 F (36.7 C), resp. rate 16, height 5' 9"$  (1.753 m), weight 201 lb 3.2 oz (91.3 kg), SpO2 100 %.Body mass index is 29.71 kg/m.  General Appearance: Well Groomed  Eye Contact:  Good  Speech:  Clear and Coherent and Normal Rate  Volume:  Normal  Mood:  Anxious and Depressed  Affect:  Appropriate and Congruent  Thought Process:  Coherent, Goal Directed, and Linear  Orientation:  Full (Time, Place, and Person)  Thought Content: WDL  and Logical   Suicidal Thoughts:  No  Homicidal Thoughts:  No  Memory:  Immediate;   Good Recent;   Good Remote;   Good  Judgement:  Good  Insight:  Good  Psychomotor Activity:  Normal  Concentration:  Concentration: Good and Attention Span: Good  Recall:  Good  Fund of Knowledge: Good  Language: Good  Akathisia:  No  Handed:  Right  AIMS (if indicated): not done  Assets:  Communication Skills Desire for Improvement Financial Resources/Insurance [place] Talents/Skills  ADL's:  Intact  Cognition: WNL  Sleep:  Good   Screenings: AIMS    Flowsheet Row Admission (Discharged) from 03/11/2022 in [facility] Admission (Discharged) from 10/14/2021 in [facility]  AIMS Total Score 0 0      GAD-7    Flowsheet Row Clinical Support from 08/23/2022 in [place] from 06/14/2022 in [facility]  Office Visit from 04/13/2022 in [facility]  Total GAD-7 Score 9 15 4      $ PHQ2-9    Flowsheet Row Clinical Support from 08/23/2022 in [facility] Office Visit from 07/06/2022 in [facility] from 06/14/2022 in [facility] [place] Office Visit from 06/08/2022 in [facility] Office Visit from 04/13/2022 in [place]  PHQ-2 Total Score 3 3 4 5 1  $ PHQ-9 Total Score 10 10 16 18 $ --      Flowsheet Row Clinical Support from 06/14/2022 in [facility] Admission (Discharged) from 03/11/2022 in [place] [facility] ED from 03/10/2022 in [place] Error: Q7 should not be populated when Q6 is No No Risk Error: Question 6 not populated        Assessment and Plan: Patient endorses hypersomnia and erectile dysfunction.  Provider informed patient that generally [name] does not cause erectile dysfunction but informed him that if he wants to try oral pills or switch to a different antipsychotic he could be considered.  At this time patient request that medication not be adjusted.  He will continue all medications as prescribed.    1. Schizophrenia, unspecified type ([place])  Continue- buPROPion (WELLBUTRIN XL) 150 MG 24 hr tablet; Take 1 tablet (150 mg total) by mouth every morning.  Dispense: 30 tablet; Refill: 1 Continue- traZODone (DESYREL) 50 MG tablet; Take 1 tablet (50 mg total) by mouth at bedtime as needed for sleep.  Dispense: 30 tablet; Refill: 3 - paliperidone (INVEGA SUSTENNA) 156 MG/ML SUSY injection; Inject 1 mL (156 mg total) into the muscle every 28 (twenty-eight) days.  Dispense: 1 mL; Refill: 11  2. Generalized anxiety disorder  Continue- hydrOXYzine (ATARAX) 25 MG tablet; Take 1 tablet (25 mg total) by mouth 3  (three) times daily as needed for anxiety.  Dispense: 90 tablet; Refill: 3  3. Well adult health check  Continue all cannot be completed- hydrOXYzine (ATARAX) 25 MG tablet; Take 1 tablet (25 mg total) by mouth 3 (three) times daily as needed for anxiety.  Dispense: 90 tablet; Refill: 3    Follow-up in 3 months Salley Slaughter, NP 08/23/2022, 3:56 PM

## 2022-08-30 ENCOUNTER — Other Ambulatory Visit: Payer: Self-pay

## 2022-08-31 ENCOUNTER — Encounter (HOSPITAL_COMMUNITY): Payer: Self-pay

## 2022-08-31 ENCOUNTER — Ambulatory Visit (INDEPENDENT_AMBULATORY_CARE_PROVIDER_SITE_OTHER): Payer: Medicaid Other

## 2022-08-31 VITALS — BP 109/76 | HR 95 | Temp 98.1°F | Resp 16 | Ht 69.0 in | Wt 202.8 lb

## 2022-08-31 DIAGNOSIS — F209 Schizophrenia, unspecified: Secondary | ICD-10-CM

## 2022-08-31 DIAGNOSIS — F250 Schizoaffective disorder, bipolar type: Secondary | ICD-10-CM

## 2022-08-31 DIAGNOSIS — F411 Generalized anxiety disorder: Secondary | ICD-10-CM

## 2022-08-31 NOTE — Progress Notes (Signed)
PATINET PRESENTS TO THE OFFICE FOR INVEGA SUSTENNA 156 INJECTION GIVEN IN THE RIGHT DELTOID BY Louise Victory MA , PATIENT DID WELL OVERALL WILL RETURN IN 28 DAYS

## 2022-09-14 ENCOUNTER — Other Ambulatory Visit: Payer: Self-pay

## 2022-09-14 ENCOUNTER — Encounter: Payer: Self-pay | Admitting: Critical Care Medicine

## 2022-09-14 ENCOUNTER — Other Ambulatory Visit (HOSPITAL_COMMUNITY): Payer: Self-pay

## 2022-09-14 ENCOUNTER — Ambulatory Visit: Payer: Medicaid Other | Admitting: Critical Care Medicine

## 2022-09-14 ENCOUNTER — Ambulatory Visit: Payer: Medicaid Other | Attending: Critical Care Medicine | Admitting: Critical Care Medicine

## 2022-09-14 DIAGNOSIS — F209 Schizophrenia, unspecified: Secondary | ICD-10-CM | POA: Insufficient documentation

## 2022-09-14 DIAGNOSIS — S62141D Displaced fracture of body of hamate [unciform] bone, right wrist, subsequent encounter for fracture with routine healing: Secondary | ICD-10-CM | POA: Diagnosis not present

## 2022-09-14 DIAGNOSIS — I10 Essential (primary) hypertension: Secondary | ICD-10-CM | POA: Diagnosis not present

## 2022-09-14 DIAGNOSIS — N529 Male erectile dysfunction, unspecified: Secondary | ICD-10-CM | POA: Diagnosis not present

## 2022-09-14 DIAGNOSIS — X58XXXD Exposure to other specified factors, subsequent encounter: Secondary | ICD-10-CM | POA: Diagnosis not present

## 2022-09-14 DIAGNOSIS — Z0189 Encounter for other specified special examinations: Secondary | ICD-10-CM | POA: Diagnosis present

## 2022-09-14 DIAGNOSIS — F17210 Nicotine dependence, cigarettes, uncomplicated: Secondary | ICD-10-CM | POA: Insufficient documentation

## 2022-09-14 DIAGNOSIS — F17200 Nicotine dependence, unspecified, uncomplicated: Secondary | ICD-10-CM

## 2022-09-14 DIAGNOSIS — F32A Depression, unspecified: Secondary | ICD-10-CM | POA: Insufficient documentation

## 2022-09-14 DIAGNOSIS — F15950 Other stimulant use, unspecified with stimulant-induced psychotic disorder with delusions: Secondary | ICD-10-CM | POA: Insufficient documentation

## 2022-09-14 DIAGNOSIS — Z79899 Other long term (current) drug therapy: Secondary | ICD-10-CM | POA: Insufficient documentation

## 2022-09-14 MED ORDER — SILDENAFIL CITRATE 100 MG PO TABS
50.0000 mg | ORAL_TABLET | Freq: Every day | ORAL | 11 refills | Status: DC | PRN
  Filled 2022-09-14: qty 5, 5d supply, fill #0
  Filled 2022-10-18 – 2022-10-26 (×2): qty 5, 5d supply, fill #1
  Filled 2022-12-12: qty 5, 5d supply, fill #2

## 2022-09-14 NOTE — Progress Notes (Deleted)
   New Patient Office Visit  Subjective    Patient ID: EVERRETT LACASSE, male    DOB: 02-15-1986  Age: 37 y.o. MRN: [id number]  CC: No chief complaint on file.   HPI AVAN GULLETT presents to establish care   Outpatient Encounter Medications as of 09/14/2022  Medication Sig   [DISCONTINUED] omeprazole (PRILOSEC OTC) 20 MG tablet Take 20 mg by mouth daily.   buPROPion (WELLBUTRIN XL) 150 MG 24 hr tablet Take 1 tablet (150 mg total) by mouth every morning.   hydrOXYzine (ATARAX) 25 MG tablet Take 1 tablet (25 mg total) by mouth 3 (three) times daily as needed for anxiety.   paliperidone (INVEGA SUSTENNA) 156 MG/ML SUSY injection Inject 1 mL (156 mg total) into the muscle every 28 (twenty-eight) days.   traZODone (DESYREL) 50 MG tablet Take 1 tablet (50 mg total) by mouth at bedtime as needed for sleep.   Facility-Administered Encounter Medications as of 09/14/2022  Medication   paliperidone (INVEGA SUSTENNA) injection 156 mg    Past Medical History:  Diagnosis Date   Fracture of metacarpal of right hand, closed 08/04/2010   Comminuted fx @ [facility]   GERD (gastroesophageal reflux disease)     No past surgical history on file.  No family history on file.  Social History   Socioeconomic History   Marital status: Single    Spouse name: Not on file   Number of children: Not on file   Years of education: Not on file   Highest education level: Not on file  Occupational History   Not on file  Tobacco Use   Smoking status: Every Day    Types: E-cigarettes   Smokeless tobacco: Never  Vaping Use   Vaping Use: Never used  Substance and Sexual Activity   Alcohol use: Yes    Alcohol/week: 4.0 standard drinks of alcohol    Types: 2 Cans of beer, 2 Shots of liquor per week    Comment: occ   Drug use: Not Currently   Sexual activity: Not Currently  Other Topics Concern   Not on file  Social History Narrative   Not on file   Social Determinants of Health    Financial Resource Strain: Not on file  Food Insecurity: Not on file  Transportation Needs: Unknown (03/11/2022)   PRAPARE - Hydrologist (Medical): Patient refused    Lack of Transportation (Non-Medical): Not on file  Physical Activity: Not on file  Stress: Not on file  Social Connections: Not on file  Intimate Partner Violence: Unknown (03/11/2022)   Humiliation, Afraid, Rape, and Kick questionnaire    Fear of Current or Ex-Partner: Patient refused    Emotionally Abused: Patient refused    Physically Abused: Patient refused    Sexually Abused: Patient refused    ROS      Objective    There were no vitals taken for this visit.  Physical Exam  {Labs (Optional):23779}    Assessment & Plan:   Problem List Items Addressed This Visit   None   No follow-ups on file.   Asencion Noble, MD

## 2022-09-14 NOTE — Progress Notes (Unsigned)
New Patient Office Visit  Subjective    Patient ID: Devin Davis, male    DOB: [birth year]  Age: 37 y.o. MRN: [id number] Virtual Visit via Telephone Note  I connected with Devin Davis on 09/14/22 at  2:50 PM EDT by telephone and verified that I am speaking with the correct person using two identifiers.   Consent:  I discussed the limitations, risks, security and privacy concerns of performing an evaluation and management service by telephone and the availability of in person appointments. I also discussed with the patient that there may be a patient responsible charge related to this service. The patient expressed understanding and agreed to proceed.  Location of patient: Patient's at home  Location of provider: I am in my office at home  Persons participating in the televisit with the patient.   No one else on the call    History of Present Illness:    CC: Erectile dysfunction establish care  HPI Devin Davis presents to establish care This patient was seen by way of a telephone visit as I had to not be in the office on the day of his original appointment face-to-face.  Patient has a history of significant mental health with a schizophrenia diagnosis and depression.  He is manage by the [facility] clinic last seen in February as documented below.  Patient states current medications which include the hydralazine, trazodone, Wellbutrin, Invega injections month have helped his mental health significantly.  He does not have any extraparametal side effects.  Patient does smoke some cigarettes.  He has erectile dysfunction would like medications for same.  History of hypertension but recent numbers have been normal.  The patient is not on blood pressure medication.  Below is the documentation from the 08/23/2022 visit with behavioral health.  [name] at [facility] 08/23/22 37 year old male seen today for follow-up psychiatric evaluation.  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  Currently he is managed on hydroxyzine 25 mg 3 times daily as needed, Wellbutrin 150 mg daily, trazodone 50 mg nightly as needed, and Invega Sustenna 156 mg monthly.  Patient informed writer that his medications are somewhat effective in managing his psychiatric conditions.   Today he is well-groomed, pleasant, cooperative, and engaged in conversation.  He informed Probation officer that he continues to have erectile dysfunction and notes that he believes it is [place] that is causing it.  Provider informed patient that [name] could potentially cause erectile dysfunction and informed him that if he wants to try oral pills or switch to a different antipsychotic he could be considered.  Patient was seen with his mother who notes that she believes that his erectile dysfunction as well as schizophrenia is stress-induced.  Provider educated patient and his mother on potential causes for schizophrenia as well as erectile dysfunction.  They endorsed understanding.  Provider informed patient that an oral form of the medication can be taken and tapered if needed.  He reports that at this time he would like to continue his current dose of Invega.   Patient informed Probation officer that he continues to have depressive episodes.  He notes that he will have the drive to do something and then will become depressed and be unable to complete task.  He informed Probation officer that he would like to get back to his self.  Today provider conducted GAD-7 and patient scored a 9, at his last visit he  scored a 15.  Provider also conducted PHQ-9 patient scored a 10, at his last visit he scored a 36.  He endorses adequate sleep and appetite.  Today he denies SI/HI/VH, mania, paranoia.   Provider recommended increasing Wellbutrin to help manage depressive symptoms however patient was not agreeable.  At this time he does  not wish to lower Invega but will continue all medications as prescribed.  Provider informed patient that another antipsychotic could be trialed in the future if he continues to have sexual dysfunction.  Patient also sees primary care doctor in March and provider recommended him discussing treatment for erectile dysfunction.  He endorsed understanding and agreement.  No other concerns at this time.   Visit Diagnosis:      ICD-10-CM    1. Schizophrenia, unspecified type ([place])  F20.9 buPROPion (WELLBUTRIN XL) 150 MG 24 hr tablet      traZODone (DESYREL) 50 MG tablet      paliperidone (INVEGA SUSTENNA) 156 MG/ML SUSY injection     2. Generalized anxiety disorder  F41.1 hydrOXYzine (ATARAX) 25 MG tablet     3. Well adult health check  Z00.00 hydrOXYzine (ATARAX) 25 MG tablet           Past Psychiatric History: Amphetamine psychostimulant induced psychosis with delusions, substance-induced psychotic disorder, psychosis, schizophrenia, and tobacco use   Past Medical History:      Past Medical History:  Diagnosis Date   Fracture of metacarpal of right hand, closed 08/04/2010    Comminuted fx @ base  of [facility]   GERD (gastroesophageal reflux disease)   Patient endorses hypersomnia and erectile dysfunction.  Provider informed patient that generally [name] does not cause erectile dysfunction but informed him that if he wants to try oral pills or switch to a different antipsychotic he could be considered.  At this time patient request that medication not be adjusted.  He will continue all medications as prescribed.     1. Schizophrenia, unspecified type ([place])   Continue- buPROPion (WELLBUTRIN XL) 150 MG 24 hr tablet; Take 1 tablet (150 mg total) by mouth every morning.  Dispense: 30 tablet; Refill: 1 Continue- traZODone (DESYREL) 50 MG tablet; Take 1 tablet (50 mg total) by mouth at bedtime as needed for sleep.  Dispense: 30 tablet; Refill: 3 - paliperidone (INVEGA SUSTENNA) 156 MG/ML SUSY  injection; Inject 1 mL (156 mg total) into the muscle every 28 (twenty-eight) days.  Dispense: 1 mL; Refill: 11   2. Generalized anxiety disorder   Continue- hydrOXYzine (ATARAX) 25 MG tablet; Take 1 tablet (25 mg total) by mouth 3 (three) times daily as needed for anxiety.  Dispense: 90 tablet; Refill: 3   3. Well adult health check   Continue all cannot be completed- hydrOXYzine (ATARAX) 25 MG tablet; Take 1 tablet (25 mg total) by mouth 3 (three) times daily as needed for anxiety.  Dispense: 90 tablet; Refill: 3    Outpatient Encounter Medications as of 09/14/2022  Medication Sig   sildenafil (VIAGRA) 100 MG tablet Take 0.5-1 tablets (50-100 mg total) by mouth daily as needed for erectile dysfunction.   [DISCONTINUED] omeprazole (PRILOSEC OTC) 20 MG tablet Take 20 mg by mouth daily.   buPROPion (WELLBUTRIN XL) 150 MG 24 hr tablet Take 1 tablet (150 mg total) by mouth every morning.   hydrOXYzine (ATARAX) 25 MG tablet Take 1 tablet (25 mg total) by mouth 3 (three) times daily as needed for anxiety.   paliperidone (INVEGA SUSTENNA) 156 MG/ML SUSY injection Inject  1 mL (156 mg total) into the muscle every 28 (twenty-eight) days.   traZODone (DESYREL) 50 MG tablet Take 1 tablet (50 mg total) by mouth at bedtime as needed for sleep.   Facility-Administered Encounter Medications as of 09/14/2022  Medication   paliperidone (INVEGA SUSTENNA) injection 156 mg    Past Medical History:  Diagnosis Date   Fracture of metacarpal of right hand, closed 08/04/2010   Comminuted fx @ [facility]  [facility]   GERD (gastroesophageal reflux disease)     History reviewed. No pertinent surgical history.  History reviewed. No pertinent family history.  Social History   Socioeconomic History   Marital status: Single    Spouse name: Not on file   Number of children: Not on file   Years of education: Not on file   Highest education level: Not on file  Occupational History   Not on file  Tobacco Use    Smoking status: Every Day    Types: E-cigarettes   Smokeless tobacco: Never  Vaping Use   Vaping Use: Never used  Substance and Sexual Activity   Alcohol use: Yes    Alcohol/week: 4.0 standard drinks of alcohol    Types: 2 Cans of beer, 2 Shots of liquor per week    Comment: occ   Drug use: Not Currently   Sexual activity: Not Currently  Other Topics Concern   Not on file  Social History Narrative   Not on file   Social Determinants of Health   Financial Resource Strain: Not on file  Food Insecurity: Not on file  Transportation Needs: Unknown (03/11/2022)   PRAPARE - Hydrologist (Medical): Patient declined    Lack of Transportation (Non-Medical): Not on file  Physical Activity: Not on file  Stress: Not on file  Social Connections: Not on file  Intimate Partner Violence: Unknown (03/11/2022)   Humiliation, Afraid, Rape, and Kick questionnaire    Fear of Current or Ex-Partner: Patient declined    Emotionally Abused: Patient declined    Physically Abused: Patient declined    Sexually Abused: Patient declined    Review of Systems  Constitutional:  Negative for chills, diaphoresis, fever, malaise/fatigue and weight loss.  HENT:  Negative for congestion, hearing loss, nosebleeds, sore throat and tinnitus.   Eyes:  Negative for blurred vision, photophobia and redness.  Respiratory:  Negative for cough, hemoptysis, sputum production, shortness of breath, wheezing and stridor.   Cardiovascular:  Negative for chest pain, palpitations, orthopnea, claudication, leg swelling and PND.  Gastrointestinal:  Negative for abdominal pain, blood in stool, constipation, diarrhea, heartburn, nausea and vomiting.  Genitourinary:  Negative for dysuria, flank pain, frequency, hematuria and urgency.       Erectile dysfunction  Musculoskeletal:  Negative for back pain, falls, joint pain, myalgias and neck pain.  Skin:  Negative for itching and rash.  Neurological:   Negative for dizziness, tingling, tremors, sensory change, speech change, focal weakness, seizures, loss of consciousness, weakness and headaches.  Endo/Heme/Allergies:  Negative for environmental allergies and polydipsia. Does not bruise/bleed easily.  Psychiatric/Behavioral:  Negative for depression, memory loss, substance abuse and suicidal ideas. The patient is not nervous/anxious and does not have insomnia.         Objective   No exam this is a phone visit There were no vitals taken for this visit.  Physical Exam      Assessment & Plan:   Problem List Items Addressed This Visit  Cardiovascular and Mediastinum   Hypertension - Primary    Hypertension currently stable off the amphetamines will monitor      Relevant Medications   sildenafil (VIAGRA) 100 MG tablet     Nervous and Auditory   Amphetamine and psychostimulant-induced psychosis with delusions ([place])    Patient has been sober and off polysubstance use for several months we will need to monitor this for recurrence        Musculoskeletal and Integument   RESOLVED: Closed displaced fracture of hamate bone of right wrist    Resolved        Other   TOBACCO USE       Current smoking consumption amount: 1 pack a day  Dicsussion on advise to quit smoking and smoking impacts: Cardiovascular impacts  Patient's willingness to quit: Not ready to quit  Methods to quit smoking discussed: Not discussed this encounter  Medication management of smoking session drugs discussed: Not discussed   Setting quit date not established  Follow-up arranged In person follow-up 2 to 3 weeks  Time spent counseling the patient:   3 minutes      Schizophrenia ([place])    Currently stable on medications per behavioral health no evidence of suicidal ideation or polysubstance use       Follow Up Instructions: Patient knows he will have return visit short-term   I discussed the assessment and treatment plan with the  patient. The patient was provided an opportunity to ask questions and all were answered. The patient agreed with the plan and demonstrated an understanding of the instructions.   The patient was advised to call back or seek an in-person evaluation if the symptoms worsen or if the condition fails to improve as anticipated.  I provided 30 minutes of non-face-to-face time during this encounter  including  median intraservice time , review of notes, labs, imaging, medications  and explaining diagnosis and management to the patient .    Asencion Noble, MD

## 2022-09-15 NOTE — Assessment & Plan Note (Signed)
Patient has been sober and off polysubstance use for several months we will need to monitor this for recurrence

## 2022-09-15 NOTE — Assessment & Plan Note (Signed)
    Current smoking consumption amount: 1 pack a day  Dicsussion on advise to quit smoking and smoking impacts: Cardiovascular impacts  Patient's willingness to quit: Not ready to quit  Methods to quit smoking discussed: Not discussed this encounter  Medication management of smoking session drugs discussed: Not discussed   Setting quit date not established  Follow-up arranged In person follow-up 2 to 3 weeks  Time spent counseling the patient:   3 minutes

## 2022-09-15 NOTE — Assessment & Plan Note (Signed)
Currently stable on medications per behavioral health no evidence of suicidal ideation or polysubstance use

## 2022-09-15 NOTE — Assessment & Plan Note (Signed)
Resolved

## 2022-09-15 NOTE — Assessment & Plan Note (Signed)
Hypertension currently stable off the amphetamines will monitor

## 2022-09-16 ENCOUNTER — Other Ambulatory Visit: Payer: Self-pay

## 2022-09-22 ENCOUNTER — Encounter: Payer: Self-pay | Admitting: Critical Care Medicine

## 2022-09-22 ENCOUNTER — Ambulatory Visit: Payer: Medicaid Other | Attending: Critical Care Medicine | Admitting: Critical Care Medicine

## 2022-09-22 ENCOUNTER — Telehealth: Payer: Self-pay | Admitting: Emergency Medicine

## 2022-09-22 VITALS — BP 134/78 | HR 93 | Ht 69.0 in | Wt 203.0 lb

## 2022-09-22 DIAGNOSIS — F17200 Nicotine dependence, unspecified, uncomplicated: Secondary | ICD-10-CM

## 2022-09-22 DIAGNOSIS — I10 Essential (primary) hypertension: Secondary | ICD-10-CM | POA: Insufficient documentation

## 2022-09-22 DIAGNOSIS — F209 Schizophrenia, unspecified: Secondary | ICD-10-CM | POA: Insufficient documentation

## 2022-09-22 DIAGNOSIS — F32A Depression, unspecified: Secondary | ICD-10-CM | POA: Insufficient documentation

## 2022-09-22 DIAGNOSIS — Z1329 Encounter for screening for other suspected endocrine disorder: Secondary | ICD-10-CM

## 2022-09-22 DIAGNOSIS — Z113 Encounter for screening for infections with a predominantly sexual mode of transmission: Secondary | ICD-10-CM

## 2022-09-22 DIAGNOSIS — F15950 Other stimulant use, unspecified with stimulant-induced psychotic disorder with delusions: Secondary | ICD-10-CM

## 2022-09-22 DIAGNOSIS — F411 Generalized anxiety disorder: Secondary | ICD-10-CM | POA: Insufficient documentation

## 2022-09-22 DIAGNOSIS — Z1159 Encounter for screening for other viral diseases: Secondary | ICD-10-CM | POA: Diagnosis not present

## 2022-09-22 DIAGNOSIS — F17210 Nicotine dependence, cigarettes, uncomplicated: Secondary | ICD-10-CM | POA: Insufficient documentation

## 2022-09-22 DIAGNOSIS — R451 Restlessness and agitation: Secondary | ICD-10-CM

## 2022-09-22 DIAGNOSIS — K029 Dental caries, unspecified: Secondary | ICD-10-CM

## 2022-09-22 DIAGNOSIS — R739 Hyperglycemia, unspecified: Secondary | ICD-10-CM

## 2022-09-22 NOTE — Assessment & Plan Note (Signed)
    Current smoking consumption amount: 1 pack a day  Dicsussion on advise to quit smoking and smoking impacts: Cardiovascular impacts  Patient's willingness to quit: Not ready to quit  Methods to quit smoking discussed: Not discussed this encounter  Medication management of smoking session drugs discussed: Not discussed   Setting quit date not established  Follow-up arranged 4 months time spent counseling the patient:   3 minutes

## 2022-09-22 NOTE — Telephone Encounter (Signed)
Copied from [place] [id number]. Topic: General - Other >> Sep 22, 2022  1:13 PM Everette C wrote: Reason for CRM: Devin Davis with Cytology has called to share that a requisition has been received without a specimen   Please contact Devin Davis further when possible

## 2022-09-22 NOTE — Assessment & Plan Note (Signed)
He is not a good candidate for Strattera or Vyvanse I told him to speak to mental health but I doubt they will write for this

## 2022-09-22 NOTE — Patient Instructions (Signed)
Labs urine for STD screen, other screening labs Work on vaping reduction Talk to mental health about ADHD treatments Return to Dr Joya Gaskins 4 months Dental referral made

## 2022-09-22 NOTE — Progress Notes (Signed)
Mother in room with patient. Patient feels fatigued, and losses focus easily.

## 2022-09-22 NOTE — Progress Notes (Signed)
New Patient Office Visit  Subjective    Patient ID: Devin Davis, male    DOB: March 03, 1986  Age: 37 y.o. MRN: [id number]     CC:   HPI 09/14/22 Devin Davis presents to establish care This patient was seen by way of a telephone visit as I had to not be in the office on the day of his original appointment face-to-face.  Patient has a history of significant mental health with a schizophrenia diagnosis and depression.  He is manage by the [facility] last seen in February as documented below.  Patient states current medications which include the hydralazine, trazodone, Wellbutrin, Invega injections month have helped his mental health significantly.  He does not have any extraparametal side effects.  Patient does smoke some cigarettes.  He has erectile dysfunction would like medications for same.  History of hypertension but recent numbers have been normal.  The patient is not on blood pressure medication.  Below is the documentation from the 08/23/2022 visit with behavioral health.  [name] at [facility] 08/23/22 37 year old male seen today for follow-up psychiatric evaluation.  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  Currently he is managed on hydroxyzine 25 mg 3 times daily as needed, Wellbutrin 150 mg daily, trazodone 50 mg nightly as needed, and Invega Sustenna 156 mg monthly.  Patient informed writer that his medications are somewhat effective in managing his psychiatric conditions.   Today he is well-groomed, pleasant, cooperative, and engaged in conversation.  He informed Probation officer that he continues to have erectile dysfunction and notes that he believes it is [place] that is causing it.  Provider informed patient that [name] could potentially cause erectile dysfunction and informed him that if he wants to try oral pills or switch to a different  antipsychotic he could be considered.  Patient was seen with his mother who notes that she believes that his erectile dysfunction as well as schizophrenia is stress-induced.  Provider educated patient and his mother on potential causes for schizophrenia as well as erectile dysfunction.  They endorsed understanding.  Provider informed patient that an oral form of the medication can be taken and tapered if needed.  He reports that at this time he would like to continue his current dose of Invega.   Patient informed Probation officer that he continues to have depressive episodes.  He notes that he will have the drive to do something and then will become depressed and be unable to complete task.  He informed Probation officer that he would like to get back to his self.  Today provider conducted GAD-7 and patient scored a 9, at his last visit he scored a 15.  Provider also conducted PHQ-9 patient scored a 10, at his last visit he scored a 68.  He endorses adequate sleep and appetite.  Today he denies SI/HI/VH, mania, paranoia.   Provider recommended increasing Wellbutrin to help manage depressive symptoms however patient was not agreeable.  At this time he does not wish to lower Invega but will continue all medications as prescribed.  Provider informed patient that another antipsychotic could be trialed in the future if he continues to have sexual dysfunction.  Patient also sees primary care doctor in March and provider recommended him discussing treatment for erectile dysfunction.  He endorsed understanding and agreement.  No other concerns at this time.   Visit Diagnosis:      ICD-10-CM  1. Schizophrenia, unspecified type ([place])  F20.9 buPROPion (WELLBUTRIN XL) 150 MG 24 hr tablet      traZODone (DESYREL) 50 MG tablet      paliperidone (INVEGA SUSTENNA) 156 MG/ML SUSY injection     2. Generalized anxiety disorder  F41.1 hydrOXYzine (ATARAX) 25 MG tablet     3. Well adult health check  Z00.00 hydrOXYzine (ATARAX) 25 MG tablet            Past Psychiatric History: Amphetamine psychostimulant induced psychosis with delusions, substance-induced psychotic disorder, psychosis, schizophrenia, and tobacco use   Past Medical History:      Past Medical History:  Diagnosis Date   Fracture of metacarpal of right hand, closed 08/04/2010    Comminuted fx @ [facility]  [facility]   GERD (gastroesophageal reflux disease)   Patient endorses hypersomnia and erectile dysfunction.  Provider informed patient that generally [name] does not cause erectile dysfunction but informed him that if he wants to try oral pills or switch to a different antipsychotic he could be considered.  At this time patient request that medication not be adjusted.  He will continue all medications as prescribed.     1. Schizophrenia, unspecified type ([place])   Continue- buPROPion (WELLBUTRIN XL) 150 MG 24 hr tablet; Take 1 tablet (150 mg total) by mouth every morning.  Dispense: 30 tablet; Refill: 1 Continue- traZODone (DESYREL) 50 MG tablet; Take 1 tablet (50 mg total) by mouth at bedtime as needed for sleep.  Dispense: 30 tablet; Refill: 3 - paliperidone (INVEGA SUSTENNA) 156 MG/ML SUSY injection; Inject 1 mL (156 mg total) into the muscle every 28 (twenty-eight) days.  Dispense: 1 mL; Refill: 11   2. Generalized anxiety disorder   Continue- hydrOXYzine (ATARAX) 25 MG tablet; Take 1 tablet (25 mg total) by mouth 3 (three) times daily as needed for anxiety.  Dispense: 90 tablet; Refill: 3   3. Well adult health check   Continue all cannot be completed- hydrOXYzine (ATARAX) 25 MG tablet; Take 1 tablet (25 mg total) by mouth 3 (three) times daily as needed for anxiety.  Dispense: 90 tablet; Refill: 3   09/22/22 Patient seen in return follow-up last was seen by way of telephone visit.  On arrival blood pressure 134/78.  He does smoke tobacco products.  He has attended with his mother.  He is not on medication for blood pressure.  He has schizophrenia is on Invega  injections weekly hydroxyzine as needed trazodone as needed.  We did start Viagra for him at the last visit he states is effective for his erectile dysfunction.  The patient is inquiring if I will write a prescription for ADHD for a Strattera or Vyvanse.  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  Note he is also on Wellbutrin  Outpatient Encounter Medications as of 09/22/2022  Medication Sig   buPROPion (WELLBUTRIN XL) 150 MG 24 hr tablet Take 1 tablet (150 mg total) by mouth every morning.   hydrOXYzine (ATARAX) 25 MG tablet Take 1 tablet (25 mg total) by mouth 3 (three) times daily as needed for anxiety.   paliperidone (INVEGA SUSTENNA) 156 MG/ML SUSY injection Inject 1 mL (156 mg total) into the muscle every 28 (twenty-eight) days.   sildenafil (VIAGRA) 100 MG tablet Take 0.5-1 tablets (50-100 mg total) by mouth daily as needed for erectile dysfunction.   traZODone (DESYREL) 50  MG tablet Take 1 tablet (50 mg total) by mouth at bedtime as needed for sleep.   [DISCONTINUED] omeprazole (PRILOSEC OTC) 20 MG tablet Take 20 mg by mouth daily.   Facility-Administered Encounter Medications as of 09/22/2022  Medication   paliperidone (INVEGA SUSTENNA) injection 156 mg    Past Medical History:  Diagnosis Date   Fracture of metacarpal of right hand, closed 08/04/2010   Comminuted fx @ [facility]  [facility]   GERD (gastroesophageal reflux disease)    Hypertension    Schizophrenia ([place]) 2023    History reviewed. No pertinent surgical history.  History reviewed. No pertinent family history.  Social History   Socioeconomic History   Marital status: Single    Spouse name: Not on file   Number of children: Not on file   Years of education: Not on file   Highest education level: Not on file  Occupational History   Not on file  Tobacco Use   Smoking status: Some Days    Types:  E-cigarettes, Cigarettes   Smokeless tobacco: Never  Vaping Use   Vaping Use: Every day  Substance and Sexual Activity   Alcohol use: Yes    Alcohol/week: 4.0 standard drinks of alcohol    Types: 2 Cans of beer, 2 Shots of liquor per week    Comment: occ   Drug use: Not Currently   Sexual activity: Not Currently  Other Topics Concern   Not on file  Social History Narrative   Not on file   Social Determinants of Health   Financial Resource Strain: Not on file  Food Insecurity: Not on file  Transportation Needs: Unknown (03/11/2022)   PRAPARE - Hydrologist (Medical): Patient declined    Lack of Transportation (Non-Medical): Not on file  Physical Activity: Not on file  Stress: Not on file  Social Connections: Not on file  Intimate Partner Violence: Patient Declined (03/11/2022)   Humiliation, Afraid, Rape, and Kick questionnaire    Fear of Current or Ex-Partner: Patient declined    Emotionally Abused: Patient declined    Physically Abused: Patient declined    Sexually Abused: Patient declined    Review of Systems  Constitutional:  Negative for chills, diaphoresis, fever, malaise/fatigue and weight loss.  HENT:  Negative for congestion, hearing loss, nosebleeds, sore throat and tinnitus.   Eyes:  Negative for blurred vision, photophobia and redness.  Respiratory:  Negative for cough, hemoptysis, sputum production, shortness of breath, wheezing and stridor.   Cardiovascular:  Negative for chest pain, palpitations, orthopnea, claudication, leg swelling and PND.  Gastrointestinal:  Negative for abdominal pain, blood in stool, constipation, diarrhea, heartburn, nausea and vomiting.  Genitourinary:  Negative for dysuria, flank pain, frequency, hematuria and urgency.       Erectile dysfunction  Musculoskeletal:  Negative for back pain, falls, joint pain, myalgias and neck pain.  Skin:  Negative for itching and rash.  Neurological:  Negative for dizziness,  tingling, tremors, sensory change, speech change, focal weakness, seizures, loss of consciousness, weakness and headaches.  Endo/Heme/Allergies:  Negative for environmental allergies and polydipsia. Does not bruise/bleed easily.  Psychiatric/Behavioral:  Negative for depression, memory loss, substance abuse and suicidal ideas. The patient is not nervous/anxious and does not have insomnia.         Objective   No exam this is a phone visit BP 134/78   Pulse 93   Ht 5\' 9"  (1.753 m)   Wt 203 lb (92.1 kg)  SpO2 98%   BMI 29.98 kg/m   Physical Exam Vitals reviewed.  Constitutional:      Appearance: Normal appearance. He is well-developed. He is not diaphoretic.  HENT:     Head: Normocephalic and atraumatic.     Nose: No nasal deformity, septal deviation, mucosal edema or rhinorrhea.     Right Sinus: No maxillary sinus tenderness or frontal sinus tenderness.     Left Sinus: No maxillary sinus tenderness or frontal sinus tenderness.     Mouth/Throat:     Pharynx: No oropharyngeal exudate.  Eyes:     General: No scleral icterus.    Conjunctiva/sclera: Conjunctivae normal.     Pupils: Pupils are equal, round, and reactive to light.  Neck:     Thyroid: No thyromegaly.     Vascular: No carotid bruit or JVD.     Trachea: Trachea normal. No tracheal tenderness or tracheal deviation.  Cardiovascular:     Rate and Rhythm: Normal rate and regular rhythm.     Chest Wall: PMI is not displaced.     Pulses: Normal pulses. No decreased pulses.     Heart sounds: Normal heart sounds, S1 normal and S2 normal. Heart sounds not distant. No murmur heard.    No systolic murmur is present.     No diastolic murmur is present.     No friction rub. No gallop. No S3 or S4 sounds.  Pulmonary:     Effort: No tachypnea, accessory muscle usage or respiratory distress.     Breath sounds: No stridor. No decreased breath sounds, wheezing, rhonchi or rales.  Chest:     Chest wall: No tenderness.  Abdominal:      General: Bowel sounds are normal. There is no distension.     Palpations: Abdomen is soft. Abdomen is not rigid.     Tenderness: There is no abdominal tenderness. There is no guarding or rebound.  Musculoskeletal:        General: Normal range of motion.     Cervical back: Normal range of motion and neck supple. No edema, erythema or rigidity. No muscular tenderness. Normal range of motion.  Lymphadenopathy:     Head:     Right side of head: No submental or submandibular adenopathy.     Left side of head: No submental or submandibular adenopathy.     Cervical: No cervical adenopathy.  Skin:    General: Skin is warm and dry.     Coloration: Skin is not pale.     Findings: No rash.     Nails: There is no clubbing.  Neurological:     Mental Status: He is alert and oriented to person, place, and time.     Sensory: No sensory deficit.  Psychiatric:        Mood and Affect: Mood normal.        Speech: Speech normal.        Behavior: Behavior normal.        Thought Content: Thought content normal.        Judgment: Judgment normal.         Assessment & Plan:  Screening labs will be obtained Problem List Items Addressed This Visit       Cardiovascular and Mediastinum   Hypertension    No active blood pressure issues at this time monitor off medication        Nervous and Auditory   Amphetamine and psychostimulant-induced psychosis with delusions ([place])    He is not a  good candidate for Strattera or Vyvanse I told him to speak to mental health but I doubt they will write for this        Other   TOBACCO USE       Current smoking consumption amount: 1 pack a day  Dicsussion on advise to quit smoking and smoking impacts: Cardiovascular impacts  Patient's willingness to quit: Not ready to quit  Methods to quit smoking discussed: Not discussed this encounter  Medication management of smoking session drugs discussed: Not discussed   Setting quit date not  established  Follow-up arranged 4 months time spent counseling the patient:   3 minutes      Other Visit Diagnoses     Need for hepatitis C screening test    -  Primary   Relevant Orders   HCV Ab w Reflex to Quant PCR   Hyperglycemia       Relevant Orders   Hemoglobin [id number]   Basic metabolic panel   Agitation       Relevant Orders   Thyroid Panel With TSH   Thyroid disorder screen       Relevant Orders   Thyroid Panel With TSH   Screen for STD (sexually transmitted disease)       Relevant Orders   Cervicovaginal ancillary only   Dental caries       Relevant Orders   Ambulatory referral to Dentistry      Return in about 4 months (around 01/22/2023) for primary care follow up.

## 2022-09-22 NOTE — Assessment & Plan Note (Signed)
No active blood pressure issues at this time monitor off medication

## 2022-09-23 ENCOUNTER — Telehealth: Payer: Self-pay

## 2022-09-23 LAB — THYROID PANEL WITH TSH
Free Thyroxine Index: 2.3 (ref 1.2–4.9)
T3 Uptake Ratio: 28 % (ref 24–39)
T4, Total: 8.1 ug/dL (ref 4.5–12.0)
TSH: 1.88 u[IU]/mL (ref 0.450–4.500)

## 2022-09-23 LAB — HCV INTERPRETATION

## 2022-09-23 LAB — HEMOGLOBIN A1C
Est. average glucose Bld gHb Est-mCnc: 123 mg/dL
Hgb A1c MFr Bld: 5.9 % — ABNORMAL HIGH (ref 4.8–5.6)

## 2022-09-23 LAB — HCV AB W REFLEX TO QUANT PCR: HCV Ab: NONREACTIVE

## 2022-09-23 NOTE — Progress Notes (Signed)
Let pt know all labs normal. Hep c neg

## 2022-09-23 NOTE — Telephone Encounter (Signed)
Called patient and appointment made  to redo

## 2022-09-23 NOTE — Telephone Encounter (Signed)
-----   Message from Elsie Stain, MD sent at 09/23/2022  2:04 PM EDT ----- Let pt know all labs normal. Hep c neg

## 2022-09-23 NOTE — Telephone Encounter (Signed)
Pt was called and is aware of results, DOB was confirmed.  ?

## 2022-09-28 ENCOUNTER — Ambulatory Visit (INDEPENDENT_AMBULATORY_CARE_PROVIDER_SITE_OTHER): Payer: Medicaid Other

## 2022-09-28 ENCOUNTER — Ambulatory Visit (INDEPENDENT_AMBULATORY_CARE_PROVIDER_SITE_OTHER): Payer: Medicaid Other | Admitting: Student in an Organized Health Care Education/Training Program

## 2022-09-28 ENCOUNTER — Other Ambulatory Visit (HOSPITAL_COMMUNITY)
Admission: RE | Admit: 2022-09-28 | Discharge: 2022-09-28 | Disposition: A | Discharge status: Home / Self Care | Payer: Medicaid Other | Source: Ambulatory Visit | Attending: Critical Care Medicine | Admitting: Critical Care Medicine

## 2022-09-28 ENCOUNTER — Ambulatory Visit: Payer: Medicaid Other | Attending: Critical Care Medicine

## 2022-09-28 ENCOUNTER — Encounter (HOSPITAL_COMMUNITY): Payer: Self-pay

## 2022-09-28 VITALS — BP 114/78 | HR 80 | Ht 69.0 in | Wt 201.0 lb

## 2022-09-28 VITALS — BP 114/78 | HR 80 | Ht 69.0 in | Wt 201.2 lb

## 2022-09-28 DIAGNOSIS — Z113 Encounter for screening for infections with a predominantly sexual mode of transmission: Secondary | ICD-10-CM | POA: Diagnosis not present

## 2022-09-28 DIAGNOSIS — F250 Schizoaffective disorder, bipolar type: Secondary | ICD-10-CM

## 2022-09-28 DIAGNOSIS — F411 Generalized anxiety disorder: Secondary | ICD-10-CM

## 2022-09-28 DIAGNOSIS — F209 Schizophrenia, unspecified: Secondary | ICD-10-CM | POA: Diagnosis not present

## 2022-09-28 DIAGNOSIS — R739 Hyperglycemia, unspecified: Secondary | ICD-10-CM

## 2022-09-28 NOTE — Progress Notes (Signed)
BH MD/PA/NP OP Progress Note  09/28/2022 3:27 PM Devin Davis  MRN:  [id number]  Chief Complaint:  Chief Complaint  Patient presents with   LAI   HPI:  Devin Davis is a 37 yr old male who presents for LAI injection.  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  He reports that they are keeping him stable.  He reports no side effects to them.  He reports that he continues to have issues with focus and being tired at work.  He asked if it would be possible to start ADHD medicine.  Discussed with him that neuropsych testing will need to be done first and he will be provided with a list of providers to do this testing.  Discussed would not make any other changes of medications at this time.  He was agreeable to this.  He reports no SI, HI, or AVH.  He reports his sleep is good.  He reports his appetite is doing good.  He reports no other concerns at present.   Visit Diagnosis:    ICD-10-CM   1. Schizophrenia, unspecified type ([place])  F20.9     2. 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  Family Psychiatric History: Maternal grandmother- schizophrenia maternal aunt- schizophrenia  maternal uncle- schizophrenia   Family History: No family history on file.  Social History:  Social History   Socioeconomic History   Marital status: Single    Spouse name: Not on file   Number of children: Not  on file   Years of education: Not on file   Highest education level: Not on file  Occupational History   Not on file  Tobacco Use   Smoking status: Some Days    Types: E-cigarettes, Cigarettes   Smokeless tobacco: Never  Vaping Use   Vaping Use: Every day  Substance and Sexual Activity   Alcohol use: Yes    Alcohol/week: 4.0 standard drinks of alcohol    Types: 2 Cans of beer, 2 Shots of liquor per week    Comment: occ   Drug use: Not Currently   Sexual activity: Not Currently  Other Topics Concern   Not on file  Social History Narrative   Not on file   Social Determinants of Health   Financial Resource Strain: Not on file  Food Insecurity: Not on file  Transportation Needs: Unknown (03/11/2022)   PRAPARE - Hydrologist (Medical): Patient declined    Lack of Transportation (Non-Medical): Not on file  Physical Activity: Not on file  Stress: Not on file  Social Connections: Not on file    Allergies: No Known Allergies  Metabolic Disorder Labs: Lab Results  Component Value Date   HGBA1C 5.9 (H) 09/22/2022   MPG 119.76 03/13/2022   MPG 114.02 10/13/2021   Lab Results  Component Value Date   PROLACTIN 1.8 (L) 10/13/2021   Lab Results  Component Value Date   CHOL 153 03/13/2022   TRIG 65 03/13/2022   HDL 53 03/13/2022   CHOLHDL 2.9 03/13/2022   VLDL 13 03/13/2022   LDLCALC 87 03/13/2022   LDLCALC 63 10/13/2021   Lab Results  Component Value Date   TSH 1.880 09/22/2022   TSH 0.684 03/13/2022    Therapeutic Level Labs: No results found for: "LITHIUM" No results found for: "VALPROATE" No results found for: "CBMZ"  Current Medications: Current Outpatient Medications  Medication Sig Dispense Refill   buPROPion (WELLBUTRIN XL) 150 MG 24 hr tablet Take 1 tablet (150 mg total) by mouth every morning. 30 tablet 1   hydrOXYzine (ATARAX) 25 MG tablet Take 1 tablet (25 mg total) by mouth 3 (three) times daily as needed for anxiety. 90  tablet 3   paliperidone (INVEGA SUSTENNA) 156 MG/ML SUSY injection Inject 1 mL (156 mg total) into the muscle every 28 (twenty-eight) days. 1 mL 11   sildenafil (VIAGRA) 100 MG tablet Take 0.5-1 tablets (50-100 mg total) by mouth daily as needed for erectile dysfunction. 5 tablet 11   traZODone (DESYREL) 50 MG tablet Take 1 tablet (50 mg total) by mouth at bedtime as needed for sleep. 30 tablet 3   Current Facility-Administered Medications  Medication Dose Route Frequency Provider Last Rate Last Admin   paliperidone (INVEGA SUSTENNA) injection 156 mg  156 mg Intramuscular Q28 days Armando Reichert, MD   156 mg at 09/28/22 1432     Musculoskeletal: Strength & Muscle Tone: within normal limits Gait & Station: normal Patient leans: N/A  Psychiatric Specialty Exam: Review of Systems  Respiratory:  Negative for cough and shortness of breath.   Cardiovascular:  Negative for chest pain.  Gastrointestinal:  Negative for abdominal pain, constipation, diarrhea, nausea and vomiting.  Neurological:  Negative for weakness and headaches.  Psychiatric/Behavioral:  Negative for dysphoric mood, hallucinations, sleep disturbance and suicidal ideas. The patient is not nervous/anxious.     Blood pressure 114/78, pulse 80, height 5\' 9"  (1.753 m), weight 201 lb (91.2 kg), SpO2 99 %.Body mass index is 29.68 kg/m.  General Appearance: Casual and Fairly Groomed  Eye Contact:  Good  Speech:  Clear and Coherent and Normal Rate  Volume:  Normal  Mood:  Euthymic  Affect:  Appropriate and Congruent  Thought Process:  Coherent and Goal Directed  Orientation:  Full (Time, Place, and Person)  Thought Content: WDL and Logical   Suicidal Thoughts:  No  Homicidal Thoughts:  No  Memory:  Immediate;   Good Recent;   Good  Judgement:  Good  Insight:  Good  Psychomotor Activity:  Normal  Concentration:  Concentration: Good and Attention Span: Good  Recall:  Good  Fund of Knowledge: Good  Language: Good   Akathisia:  Negative  Handed:  Right  AIMS (if indicated): done AIMS= 0  Assets:  Communication Skills Desire for Improvement Financial Resources/Insurance Housing Physical Health Resilience Social Support  ADL's:  Intact  Cognition: WNL  Sleep:  Good   Screenings: [place] Office Visit from 09/28/2022 in [facility] Admission (Discharged) from 03/11/2022 in [place] [facility] Admission (Discharged) from 10/14/2021 in [place] [facility]  AIMS Total Score 0 0 0      GAD-7    [place] Office Visit from 09/22/2022 in [place] from 08/23/2022 in [place] from 06/14/2022  in [facility] Office Visit from 04/13/2022 in [facility]  Total GAD-7 Score 12 9 15 4       PHQ2-9    [place] Office Visit from 09/22/2022 in [place] from 08/23/2022 in [facility] Office Visit from 07/06/2022 in [facility] Clinical Support from 06/14/2022 in [facility] Office Visit from 06/08/2022 in [place] [facility]  PHQ-2 Total Score 5 3 3 4 5   PHQ-9 Total Score 16 10 10 16 18       Flowsheet Row Clinical Support from 06/14/2022 in [facility] Admission (Discharged) from 03/11/2022 in [place] [facility] ED from 03/10/2022 in [place] Error: Q7 should not be populated when Q6 is No No Risk Error: Question 6 not populated        Assessment and Plan:  Jair B. Lonigro is a 37 yr old male who presents for LAI injection.  PPHx is significant for schizophrenia, amphetamine and  psychostimulant induced psychosis with delusions, tobacco use, and stimulant use disorder    Yazen is doing good with his current medication regiment.  He is interested in pursuing ADHD medication so he will call to schedule testing, he was provided with [facility].  We will not make any changes to his medications at this time.    Schizophrenia, unspecified type ([place]): Continue- buPROPion (WELLBUTRIN XL) 150 MG 24 hr tablet; Take 1 tablet (150 mg total) by mouth every morning.  No refills sent at this time. Continue- traZODone (DESYREL) 50 MG tablet; Take 1 tablet (50 mg total) by mouth at bedtime as needed for sleep.  No refills sent at this time. - paliperidone (INVEGA SUSTENNA) 156 MG/ML SUSY injection; Inject 1 mL (156 mg total) into the muscle every 28 (twenty-eight) days.  No refills sent at this time.    Generalized anxiety disorder: Continue- hydrOXYzine (ATARAX) 25 MG tablet; Take 1 tablet (25 mg total) by mouth 3 (three) times daily as needed for anxiety.  No refills sent at this time.     Collaboration of Care: Collaboration of Care:   Patient/Guardian was advised Release of Information must be obtained prior to any record release in order to collaborate their care with an outside provider. Patient/Guardian was advised if they have not already done so to contact the registration department to sign all necessary forms in order for [place] to release information regarding their care.   Consent: Patient/Guardian gives verbal consent for treatment and assignment of benefits for services provided during this visit. Patient/Guardian expressed understanding and agreed to proceed.    Briant Cedar, MD 09/28/2022, 3:27 PM

## 2022-09-28 NOTE — Progress Notes (Signed)
  PATINET PRESENTS TO THE OFFICE FOR INVEGA SUSTENNA 156 INJECTION GIVEN IN THE LEFT DELTOID BY Dietrich Samuelson MA , PATIENT DID WELL OVERALL WILL RETURN IN 28 DAYS

## 2022-09-29 ENCOUNTER — Telehealth: Payer: Self-pay

## 2022-09-29 LAB — URINE CYTOLOGY ANCILLARY ONLY
Chlamydia: NEGATIVE
Comment: NEGATIVE
Comment: NEGATIVE
Comment: NORMAL
Neisseria Gonorrhea: NEGATIVE
Trichomonas: NEGATIVE

## 2022-09-29 LAB — BASIC METABOLIC PANEL
BUN/Creatinine Ratio: 10 (ref 9–20)
BUN: 9 mg/dL (ref 6–20)
CO2: 23 mmol/L (ref 20–29)
Calcium: 9.4 mg/dL (ref 8.7–10.2)
Chloride: 104 mmol/L (ref 96–106)
Creatinine, Ser: 0.89 mg/dL (ref 0.76–1.27)
Glucose: 116 mg/dL — ABNORMAL HIGH (ref 70–99)
Potassium: 4.1 mmol/L (ref 3.5–5.2)
Sodium: 139 mmol/L (ref 134–144)
eGFR: 115 mL/min/{1.73_m2} (ref >59)

## 2022-09-29 NOTE — Telephone Encounter (Signed)
-----   Message from Elsie Stain, MD sent at 09/29/2022  1:09 PM EDT ----- Let pt know STD screen negative no sexually transmitted infection seen

## 2022-09-29 NOTE — Progress Notes (Signed)
Let pt know STD screen negative no sexually transmitted infection seen

## 2022-09-29 NOTE — Telephone Encounter (Signed)
Pt was called and vm was left, Information has been sent to nurse pool.   

## 2022-09-29 NOTE — Progress Notes (Signed)
Labs all normal

## 2022-09-29 NOTE — Telephone Encounter (Signed)
-----   Message from Elsie Stain, MD sent at 09/29/2022  8:25 AM EDT ----- Labs all normal

## 2022-09-29 NOTE — Telephone Encounter (Signed)
Pt was called and is aware of results, DOB was confirmed.  ?

## 2022-10-18 ENCOUNTER — Other Ambulatory Visit: Payer: Self-pay

## 2022-10-25 ENCOUNTER — Other Ambulatory Visit: Payer: Self-pay

## 2022-10-26 ENCOUNTER — Encounter (HOSPITAL_COMMUNITY): Payer: Self-pay

## 2022-10-26 ENCOUNTER — Other Ambulatory Visit: Payer: Self-pay

## 2022-10-26 ENCOUNTER — Ambulatory Visit (HOSPITAL_COMMUNITY): Payer: Medicaid Other

## 2022-10-26 VITALS — BP 121/74 | HR 80 | Resp 16 | Ht 69.0 in | Wt 205.2 lb

## 2022-10-26 DIAGNOSIS — F209 Schizophrenia, unspecified: Secondary | ICD-10-CM

## 2022-10-26 NOTE — Progress Notes (Signed)
PATINET PRESENTS TO THE OFFICE FOR INVEGA SUSTENNA 156 INJECTION GIVEN IN THE RIGHT DELTOID BY Jalyssa Fleisher MA , PATIENT DID WELL OVERALL WILL RETURN IN 28 DAYS   

## 2022-11-03 ENCOUNTER — Other Ambulatory Visit: Payer: Self-pay

## 2022-11-03 ENCOUNTER — Ambulatory Visit (INDEPENDENT_AMBULATORY_CARE_PROVIDER_SITE_OTHER): Payer: Medicaid Other | Admitting: Psychiatry

## 2022-11-03 ENCOUNTER — Encounter (HOSPITAL_COMMUNITY): Payer: Self-pay | Admitting: Psychiatry

## 2022-11-03 VITALS — BP 142/80 | HR 73 | Temp 98.6°F | Wt 206.4 lb

## 2022-11-03 DIAGNOSIS — R41840 Attention and concentration deficit: Secondary | ICD-10-CM | POA: Diagnosis not present

## 2022-11-03 DIAGNOSIS — Z0000 Encounter for general adult medical examination without abnormal findings: Secondary | ICD-10-CM | POA: Diagnosis not present

## 2022-11-03 DIAGNOSIS — F411 Generalized anxiety disorder: Secondary | ICD-10-CM

## 2022-11-03 DIAGNOSIS — F209 Schizophrenia, unspecified: Secondary | ICD-10-CM

## 2022-11-03 MED ORDER — HYDROXYZINE HCL 25 MG PO TABS
25.0000 mg | ORAL_TABLET | Freq: Three times a day (TID) | ORAL | 3 refills | Status: DC | PRN
  Filled 2022-11-03: qty 90, 30d supply, fill #0

## 2022-11-03 MED ORDER — INVEGA SUSTENNA 156 MG/ML IM SUSY: 156.0000 mg | PREFILLED_SYRINGE | INTRAMUSCULAR | 11 refills | Status: DC

## 2022-11-03 MED ORDER — BUPROPION HCL ER (XL) 150 MG PO TB24
150.0000 mg | ORAL_TABLET | ORAL | 1 refills | Status: DC
  Filled 2022-11-03: qty 30, 30d supply, fill #0

## 2022-11-03 MED ORDER — TRAZODONE HCL 50 MG PO TABS
50.0000 mg | ORAL_TABLET | Freq: Every evening | ORAL | 3 refills | Status: DC | PRN
  Filled 2022-11-03: qty 30, 30d supply, fill #0

## 2022-11-03 NOTE — Progress Notes (Signed)
BH MD/PA/NP OP Progress Note  11/03/2022 4:52 PM Devin Davis  MRN:  [id number]  Chief Complaint: "I would like Adderall" Per mother "He has been doing okay at home"  HPI: 37 year old male seen today for follow-up psychiatric evaluation.  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  Currently he is managed on hydroxyzine 25 mg 3 times daily as needed, Wellbutrin 150 mg daily, trazodone 50 mg nightly as needed, and Invega Sustenna 156 mg monthly.  Patient informed writer that his medications are somewhat effective in managing his psychiatric conditions.  Today he is well-groomed, pleasant, cooperative, and engaged in conversation.  He informed Clinical research associate that he would like to be started on Adderall.  He informed Clinical research associate that he has taken nonprescribed Adderall in the past and found it effective.  Provider recommended trying a nonstimulant for increasing Wellbutrin.  Patient notes that he does not wish to increase Wellbutrin but reports that he would be okay in trying a nonstimulant.  He endorses symptoms of ADHD such as distractibility, forgetfulness, inattentiveness to mentally taxing tasks, and poor listening skills.  Provider informed patient that neuropsych testing was recommended.  He endorsed understanding and notes that he may have difficulty paying for this but was open to referral.  Patient was referred to [facility] behavioral medicine.  Patient informed Clinical research associate that he spoke to his primary care doctor about his symptoms of ADHD.  Per chart review Dr. Delford Field recommended that patient not be placed on Vyvanse or Strattera.  Provider informed patient that some ADHD medication can cause priapism or erectile dysfunction.  Patient recently started on Viagra to help his erectile dysfunction.  Patient requested to try a nonstimulant as he is uncertain when neuro psych assessment will be conducted.   Provider was agreeable to trialing Qulbree.  He informed Clinical research associate that his inattentiveness is affecting his job.  Patient works at [employer].  Since his last visit he informed writer that his depression has increased.  He also notes that he is somewhat anxious.  Today provider conducted GAD-7 and patient scored an 11, at his last visit he scored a 9.  Provider also conducted PHQ-9 and patient scored a 17, at his last visit he scored a 10.  He notes that he is tired most days and reports having increased sleep.  He informed Clinical research associate that his appetite is adequate.  Today he denies SI/HI/VAH, mania, or paranoia.  Patient informed Clinical research associate that his maternal grand mother recently passed.  Provider offered condolences.  Patient informed Clinical research associate that he is coping with this okay.  Provider recommended increasing Wellbutrin to help manage depressive symptoms and concentration however patient was not agreeable.  He was agreeable to trying Qelbree 200 mg for a week and then increasing to 400 mg to help manage inattentiveness.Patient given a 2-week sample of Qelbree.  Potential side effects of medication and risks vs benefits of treatment vs non-treatment were explained and discussed. All questions were answered. Patient will follow-up with neuro phych behavioral medicine for neuropsych assessment.  He will continue all other medications as prescribed.  No other concerns at this time.    Visit Diagnosis:    ICD-10-CM   1. Inattention  R41.840 Ambulatory referral to Psychology    2. Schizophrenia, unspecified type (HCC)  F20.9 buPROPion (WELLBUTRIN XL) 150 MG 24 hr tablet    paliperidone (INVEGA SUSTENNA) 156 MG/ML SUSY injection    traZODone (DESYREL) 50 MG tablet  3. Generalized anxiety disorder  F41.1 hydrOXYzine (ATARAX) 25 MG tablet    4. Well adult health check  Z00.00 hydrOXYzine (ATARAX) 25 MG tablet       Past Psychiatric History: Amphetamine psychostimulant induced psychosis with delusions,  substance-induced psychotic disorder, psychosis, schizophrenia, and tobacco use  Past Medical History:  Past Medical History:  Diagnosis Date   Fracture of metacarpal of right hand, closed 08/04/2010   Comminuted fx @ base  of [facility]   GERD (gastroesophageal reflux disease)    Hypertension    Schizophrenia (HCC) 2023   No past surgical history on file.  Family Psychiatric History: Maternal grandmother schizophrenia, maternal aunt maternal uncle schizophrenia  Family History: No family history on file.  Social History:  Social History   Socioeconomic History   Marital status: Single    Spouse name: Not on file   Number of children: Not on file   Years of education: Not on file   Highest education level: Not on file  Occupational History   Not on file  Tobacco Use   Smoking status: Some Days    Types: E-cigarettes, Cigarettes   Smokeless tobacco: Never  Vaping Use   Vaping Use: Every day  Substance and Sexual Activity   Alcohol use: Yes    Alcohol/week: 4.0 standard drinks of alcohol    Types: 2 Cans of beer, 2 Shots of liquor per week    Comment: occ   Drug use: Not Currently   Sexual activity: Not Currently  Other Topics Concern   Not on file  Social History Narrative   Not on file   Social Determinants of Health   Financial Resource Strain: Not on file  Food Insecurity: Not on file  Transportation Needs: Unknown (03/11/2022)   PRAPARE - Administrator, Civil Service (Medical): Patient declined    Lack of Transportation (Non-Medical): Not on file  Physical Activity: Not on file  Stress: Not on file  Social Connections: Not on file    Allergies: No Known Allergies  Metabolic Disorder Labs: Lab Results  Component Value Date   HGBA1C 5.9 (H) 09/22/2022   MPG 119.76 03/13/2022   MPG 114.02 10/13/2021   Lab Results  Component Value Date   PROLACTIN 1.8 (L) 10/13/2021   Lab Results  Component Value Date   CHOL 153 03/13/2022   TRIG 65  03/13/2022   HDL 53 03/13/2022   CHOLHDL 2.9 03/13/2022   VLDL 13 03/13/2022   LDLCALC 87 03/13/2022   LDLCALC 63 10/13/2021   Lab Results  Component Value Date   TSH 1.880 09/22/2022   TSH 0.684 03/13/2022    Therapeutic Level Labs: No results found for: "LITHIUM" No results found for: "VALPROATE" No results found for: "CBMZ"  Current Medications: Current Outpatient Medications  Medication Sig Dispense Refill   buPROPion (WELLBUTRIN XL) 150 MG 24 hr tablet Take 1 tablet (150 mg total) by mouth every morning. 30 tablet 1   hydrOXYzine (ATARAX) 25 MG tablet Take 1 tablet (25 mg total) by mouth 3 (three) times daily as needed for anxiety. 90 tablet 3   paliperidone (INVEGA SUSTENNA) 156 MG/ML SUSY injection Inject 1 mL (156 mg total) into the muscle every 28 (twenty-eight) days. 1 mL 11   sildenafil (VIAGRA) 100 MG tablet Take 0.5-1 tablets (50-100 mg total) by mouth daily as needed for erectile dysfunction. 5 tablet 11   traZODone (DESYREL) 50 MG tablet Take 1 tablet (50 mg total) by mouth at bedtime  as needed for sleep. 30 tablet 3   Current Facility-Administered Medications  Medication Dose Route Frequency Provider Last Rate Last Admin   paliperidone (INVEGA SUSTENNA) injection 156 mg  156 mg Intramuscular Q28 days Karsten Ro, MD   156 mg at 10/26/22 1412     Musculoskeletal: Strength & Muscle Tone: within normal limits Gait & Station: normal Patient leans: N/A  Psychiatric Specialty Exam: Review of Systems  Blood pressure (!) 142/80, pulse 73, temperature 98.6 F (37 C), weight 206 lb 6.4 oz (93.6 kg), SpO2 99 %.Body mass index is 30.48 kg/m.  General Appearance: Well Groomed  Eye Contact:  Good  Speech:  Clear and Coherent and Normal Rate  Volume:  Normal  Mood:  Anxious and Depressed  Affect:  Appropriate and Congruent  Thought Process:  Coherent, Goal Directed, and Linear  Orientation:  Full (Time, Place, and Person)  Thought Content: WDL and Logical    Suicidal Thoughts:  No  Homicidal Thoughts:  No  Memory:  Immediate;   Good Recent;   Good Remote;   Good  Judgement:  Good  Insight:  Good  Psychomotor Activity:  Normal  Concentration:  Concentration: Good and Attention Span: Good  Recall:  Good  Fund of Knowledge: Good  Language: Good  Akathisia:  No  Handed:  Right  AIMS (if indicated): not done  Assets:  Communication Skills Desire for Improvement Financial Resources/Insurance Housing Physical Health Social Support Talents/Skills  ADL's:  Intact  Cognition: WNL  Sleep:  Fair   Screenings: AIMS    Flowsheet Row Office Visit from 09/28/2022 in [facility]) [facility]) [facility]  AIMS Total Score 0 0 0      GAD-7    Flowsheet Row Clinical Support from 11/03/2022 in [facility] Office Visit from 09/22/2022 in [facility] Clinical Support from 08/23/2022 in [facility] Clinical Support from 06/14/2022 in [facility] Office Visit from 04/13/2022 in [facility]  Total GAD-7 Score 11 12 9 15 4       PHQ2-9    Flowsheet Row Clinical Support from 11/03/2022 in [facility] Office Visit from 09/22/2022 in [facility] Clinical Support from 08/23/2022 in [facility] Office Visit from 07/06/2022 in [facility] Clinical Support from 06/14/2022 in [facility]  PHQ-2 Total Score 4 5 3 3 4   PHQ-9 Total Score 17 16 10 10 16       Flowsheet Row Clinical Support from 11/03/2022 in [facility] Clinical Support from 06/14/2022 in [facility] Admission (Discharged) [facility]  C-SSRS RISK CATEGORY Error: Q3, 4, or 5 should not be populated when Q2 is No Error: Q7 should not be populated when Q6 is No No Risk        Assessment and Plan: Patient endorses hypersomnia, anxiety, depression, and inattentiveness. Provider recommended increasing Wellbutrin to help manage depressive symptoms and concentration however patient was not agreeable.  He was agreeable to trying Qelbree 200 mg for a week and then increasing to 400 mg to help manage inattentiveness. Patient given a 2-week sample of Qelbree. Patient will follow-up with neuro phych behavioral medicine for neuropsych  assessment.  He will continue all other medications as prescribed.     1. Schizophrenia, unspecified type (HCC)  Continue- buPROPion (WELLBUTRIN XL) 150 MG 24 hr tablet; Take 1 tablet (150 mg total) by mouth every morning.  Dispense: 30 tablet; Refill: 1 Continue- paliperidone (INVEGA SUSTENNA) 156 MG/ML SUSY injection; Inject 1 mL (156 mg total) into the muscle every 28 (twenty-eight) days.  Dispense: 1 mL; Refill: 11 Continue- traZODone (DESYREL) 50 MG tablet; Take 1 tablet (50 mg total) by mouth at bedtime as needed for sleep.  Dispense: 30 tablet; Refill: 3  2. Generalized anxiety disorder  Continue- hydrOXYzine (ATARAX) 25 MG tablet; Take 1 tablet (25 mg total) by mouth 3 (three) times daily as needed for anxiety.  Dispense: 90 tablet; Refill: 3  3. Well adult health check  Continue- hydrOXYzine (ATARAX) 25 MG tablet; Take 1 tablet (25 mg total) by mouth 3 (three) times daily as needed for anxiety.  Dispense: 90 tablet; Refill: 3  4. Inattention  - Ambulatory referral to Psychology    Follow-up in 3 weeks Shanna Cisco, NP 11/03/2022, 4:52 PM

## 2022-11-10 ENCOUNTER — Other Ambulatory Visit: Payer: Self-pay

## 2022-11-22 ENCOUNTER — Encounter (HOSPITAL_COMMUNITY): Payer: Self-pay | Admitting: Psychiatry

## 2022-11-22 ENCOUNTER — Ambulatory Visit (INDEPENDENT_AMBULATORY_CARE_PROVIDER_SITE_OTHER): Payer: Medicaid Other | Admitting: Psychiatry

## 2022-11-22 ENCOUNTER — Other Ambulatory Visit: Payer: Self-pay

## 2022-11-22 DIAGNOSIS — F209 Schizophrenia, unspecified: Secondary | ICD-10-CM | POA: Diagnosis not present

## 2022-11-22 DIAGNOSIS — F900 Attention-deficit hyperactivity disorder, predominantly inattentive type: Secondary | ICD-10-CM | POA: Insufficient documentation

## 2022-11-22 DIAGNOSIS — Z0000 Encounter for general adult medical examination without abnormal findings: Secondary | ICD-10-CM

## 2022-11-22 DIAGNOSIS — F411 Generalized anxiety disorder: Secondary | ICD-10-CM | POA: Diagnosis not present

## 2022-11-22 MED ORDER — HYDROXYZINE HCL 25 MG PO TABS
25.0000 mg | ORAL_TABLET | Freq: Three times a day (TID) | ORAL | 3 refills | Status: DC | PRN
Start: 2022-11-22 — End: 2023-01-30
  Filled 2022-11-22: qty 90, 30d supply, fill #0

## 2022-11-22 MED ORDER — BUPROPION HCL ER (XL) 300 MG PO TB24
300.0000 mg | ORAL_TABLET | ORAL | 3 refills | Status: DC
Start: 2022-11-22 — End: 2023-01-30
  Filled 2022-11-22: qty 30, 30d supply, fill #0

## 2022-11-22 MED ORDER — TRAZODONE HCL 50 MG PO TABS
50.0000 mg | ORAL_TABLET | Freq: Every evening | ORAL | 3 refills | Status: DC | PRN
Start: 2022-11-22 — End: 2023-01-30
  Filled 2022-11-22: qty 30, 30d supply, fill #0

## 2022-11-22 NOTE — Progress Notes (Signed)
BH MD/PA/NP OP Progress Note  11/22/2022 2:38 PM Devin Davis  MRN:  [id number]  Chief Complaint: "I am still not concentrating" Per mother "He was diagnosed with ADHD as a child"  HPI: 37 year old male seen today for follow-up psychiatric evaluation.  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  Currently he is managed on hydroxyzine 25 mg 3 times daily as needed, Wellbutrin 150 mg daily, trazodone 50 mg nightly as needed, Qulbree 400 mg daily, and [place] 156 mg monthly.  Patient informed writer that his medications are somewhat effective in managing his psychiatric conditions.  Today he is well-groomed, pleasant, cooperative, and engaged in conversation.  He informed Clinical research associate that he continues to have issues concentrating.  Patient informed Clinical research associate that this is interfering with his job.  He reports that his hours were reduced because he was unable to stay on task.  He notes that he is forgetful, disorganized, inattentive to mentally taxing task, and often has poor listening skills.  Patient also informed writer that recently he has been more depressed.  He notes that he spends his days sleeping as he lacks motivation to do things that he once enjoyed.  Today provider conducted a GAD-7 and patient scored an 10, and at last visit he scored an 11.  Provider also conducted PHQ-9 and patient scored a 14, at his last visit he scored a 17.   Patient was seen with his mother who notes that he was diagnosed with ADHD as a child.  She informed Clinical research associate that he has always had problems with inattentiveness.  She informed Clinical research associate that she chose not to place him on medications as she felt that it would cause him to be a zombie in school.  Patient was referred to [facility] behavioral medicine at his last visit however provider received notification that they were no longer excepting individuals with  Medicaid.  Provider informed patient that Adderall 15 mg could be initiated pending passing urine drug screen.  He endorsed understanding and agreed.  Provider also ordered CBC, prolactin level, CMP, LFT, lipid panel and a Hgb A1c.  Wellbutrin 150 mg and patient to 300 mg to help manage his depression.  At this time Qelbree discontinued as patient found it ineffective. He will continue all other medications as prescribed.  No other concerns at this time.    Visit Diagnosis:    ICD-10-CM   1. Schizophrenia, unspecified type (HCC)  F20.9 CBC w/Diff/Platelet    Lipid Profile    Comprehensive Metabolic Panel (CMET)    HgB A1c    Hepatic function panel    Thyroid Panel With TSH    Prolactin    Urine Drug Panel 7    buPROPion (WELLBUTRIN XL) 300 MG 24 hr tablet    traZODone (DESYREL) 50 MG tablet    2. Generalized anxiety disorder  F41.1 hydrOXYzine (ATARAX) 25 MG tablet    3. Well adult health check  Z00.00 hydrOXYzine (ATARAX) 25 MG tablet    4. Attention deficit hyperactivity disorder (ADHD), predominantly inattentive type  F90.0        Past Psychiatric History: Amphetamine psychostimulant induced psychosis with delusions, substance-induced psychotic disorder, psychosis, schizophrenia, and tobacco use  Past Medical History:  Past Medical History:  Diagnosis Date   Fracture of metacarpal of right hand, closed 08/04/2010   Comminuted fx @ base  of [facility]   GERD (gastroesophageal reflux disease)    Hypertension  Schizophrenia (HCC) 2023   No past surgical history on file.  Family Psychiatric History: Maternal grandmother schizophrenia, maternal aunt maternal uncle schizophrenia  Family History: No family history on file.  Social History:  Social History   Socioeconomic History   Marital status: Single    Spouse name: Not on file   Number of children: Not on file   Years of education: Not on file   Highest education level: Not on file  Occupational History   Not on  file  Tobacco Use   Smoking status: Some Days    Types: E-cigarettes, Cigarettes   Smokeless tobacco: Never  Vaping Use   Vaping Use: Every day  Substance and Sexual Activity   Alcohol use: Yes    Alcohol/week: 4.0 standard drinks of alcohol    Types: 2 Cans of beer, 2 Shots of liquor per week    Comment: occ   Drug use: Not Currently   Sexual activity: Not Currently  Other Topics Concern   Not on file  Social History Narrative   Not on file   Social Determinants of Health   Financial Resource Strain: Not on file  Food Insecurity: Not on file  Transportation Needs: Unknown (03/11/2022)   PRAPARE - Administrator, Civil Service (Medical): Patient declined    Lack of Transportation (Non-Medical): Not on file  Physical Activity: Not on file  Stress: Not on file  Social Connections: Not on file    Allergies: No Known Allergies  Metabolic Disorder Labs: Lab Results  Component Value Date   HGBA1C 5.9 (H) 09/22/2022   MPG 119.76 03/13/2022   MPG 114.02 10/13/2021   Lab Results  Component Value Date   PROLACTIN 1.8 (L) 10/13/2021   Lab Results  Component Value Date   CHOL 153 03/13/2022   TRIG 65 03/13/2022   HDL 53 03/13/2022   CHOLHDL 2.9 03/13/2022   VLDL 13 03/13/2022   LDLCALC 87 03/13/2022   LDLCALC 63 10/13/2021   Lab Results  Component Value Date   TSH 1.880 09/22/2022   TSH 0.684 03/13/2022    Therapeutic Level Labs: No results found for: "LITHIUM" No results found for: "VALPROATE" No results found for: "CBMZ"  Current Medications: Current Outpatient Medications  Medication Sig Dispense Refill   buPROPion (WELLBUTRIN XL) 300 MG 24 hr tablet Take 1 tablet (300 mg total) by mouth every morning. 30 tablet 3   hydrOXYzine (ATARAX) 25 MG tablet Take 1 tablet (25 mg total) by mouth 3 (three) times daily as needed for anxiety. 90 tablet 3   paliperidone (INVEGA SUSTENNA) 156 MG/ML SUSY injection Inject 1 mL (156 mg total) into the muscle every  28 (twenty-eight) days. 1 mL 11   sildenafil (VIAGRA) 100 MG tablet Take 0.5-1 tablets (50-100 mg total) by mouth daily as needed for erectile dysfunction. 5 tablet 11   traZODone (DESYREL) 50 MG tablet Take 1 tablet (50 mg total) by mouth at bedtime as needed for sleep. 30 tablet 3   Current Facility-Administered Medications  Medication Dose Route Frequency Provider Last Rate Last Admin   paliperidone (INVEGA SUSTENNA) injection 156 mg  156 mg Intramuscular Q28 days Karsten Ro, MD   156 mg at 10/26/22 1412     Musculoskeletal: Strength & Muscle Tone: within normal limits Gait & Station: normal Patient leans: N/A  Psychiatric Specialty Exam: Review of Systems  There were no vitals taken for this visit.There is no height or weight on file to calculate BMI.  General Appearance:  Well Groomed  Eye Contact:  Good  Speech:  Clear and Coherent and Normal Rate  Volume:  Normal  Mood:  Anxious and Depressed  Affect:  Appropriate and Congruent  Thought Process:  Coherent, Goal Directed, and Linear  Orientation:  Full (Time, Place, and Person)  Thought Content: WDL and Logical   Suicidal Thoughts:  No  Homicidal Thoughts:  No  Memory:  Immediate;   Good Recent;   Good Remote;   Good  Judgement:  Good  Insight:  Good  Psychomotor Activity:  Normal  Concentration:  Concentration: Fair and Attention Span: Fair  Recall:  Good  Fund of Knowledge: Good  Language: Good  Akathisia:  No  Handed:  Right  AIMS (if indicated): not done  Assets:  Communication Skills Desire for Improvement Financial Resources/Insurance Housing Physical Health Social Support Talents/Skills  ADL's:  Intact  Cognition: WNL  Sleep:  Fair   Screenings: AIMS    Flowsheet Row Office Visit from 09/28/2022 in [facility] Admission (Discharged) [facility] (Dis[facility]) from [facility]  AIMS Total Score 0 0 0      GAD-7    Flowsheet Row Clinical Support from 11/22/2022 in [facility] Clinical Support from 11/03/2022 in [facility] Office Visit from 09/22/2022 in [facility] Clinical Support from 08/23/2022 in [facility] Clinical Support from 06/14/2022 in [facility]  Total GAD-7 Score 10 11 12 9 15       PHQ2-9    Flowsheet Row Clinical Support from 11/22/2022 in [facility] Clinical Support from 11/03/2022 in [facility] Office Visit from 09/22/2022 in [facility] Clinical Support from 08/23/2022 in [facility] Office Visit from 07/06/2022 in [facility]  PHQ-2 Total Score 4 4 5 3 3   PHQ-9 Total Score 14 17 16 10 10       Flowsheet Row Clinical Support from 11/22/2022 in [facility] Clinical Support from 11/03/2022 in [facility] Clinical Support from 06/14/2022 in [facility]  C-SSRS RISK CATEGORY Error: Q3, 4, or 5 should not be populated when Q2 is No Error: Q3, 4, or 5 should not be populated when Q2 is No Error: Q7 should not be populated when Q6 is No        Assessment and Plan: Patient endorses hypersomnia, anxiety, depression, and inattentiveness. Patient was referred to [facility] behavioral medicine at his last visit however provider received notification that they were no longer excepting individuals with Medicaid.  Provider informed patient that Adderall 15 mg could be initiated pending passing urine drug screen.  He endorsed understanding and agreed.  Provider also ordered CBC, prolactin level, CMP, LFT, lipid panel and a Hgb A1c.  Wellbutrin 150 mg and patient to 300 mg to help  manage his depression.  At this time Qelbree discontinued as patient found it ineffective. He will continue all other medications as prescribed.    1. Schizophrenia, unspecified type (HCC)  - CBC w/Diff/Platelet - Lipid Profile - Comprehensive Metabolic Panel (CMET) - HgB A1c - Hepatic function panel - Thyroid Panel With TSH - Prolactin - Urine Drug Panel 7 Increased- buPROPion (WELLBUTRIN XL) 300 MG 24 hr tablet; Take 1 tablet (300 mg  total) by mouth every morning.  Dispense: 30 tablet; Refill: 3 Continue- traZODone (DESYREL) 50 MG tablet; Take 1 tablet (50 mg total) by mouth at bedtime as needed for sleep.  Dispense: 30 tablet; Refill: 3  2. Generalized anxiety disorder  Continue- hydrOXYzine (ATARAX) 25 MG tablet; Take 1 tablet (25 mg total) by mouth 3 (three) times daily as needed for anxiety.  Dispense: 90 tablet; Refill: 3  3. Well adult health check  Continue- hydrOXYzine (ATARAX) 25 MG tablet; Take 1 tablet (25 mg total) by mouth 3 (three) times daily as needed for anxiety.  Dispense: 90 tablet; Refill: 3  4. Attention deficit hyperactivity disorder (ADHD), predominantly inattentive type      Follow-up in 3 weeks Shanna Cisco, NP 11/22/2022, 2:38 PM

## 2022-11-23 ENCOUNTER — Ambulatory Visit (INDEPENDENT_AMBULATORY_CARE_PROVIDER_SITE_OTHER): Payer: Medicaid Other

## 2022-11-23 ENCOUNTER — Encounter (HOSPITAL_COMMUNITY): Payer: Self-pay

## 2022-11-23 VITALS — BP 118/76 | HR 96 | Wt 206.8 lb

## 2022-11-23 DIAGNOSIS — F209 Schizophrenia, unspecified: Secondary | ICD-10-CM

## 2022-11-23 DIAGNOSIS — F411 Generalized anxiety disorder: Secondary | ICD-10-CM | POA: Diagnosis not present

## 2022-11-23 DIAGNOSIS — G4700 Insomnia, unspecified: Secondary | ICD-10-CM

## 2022-11-23 DIAGNOSIS — F200 Paranoid schizophrenia: Secondary | ICD-10-CM

## 2022-11-23 LAB — COMPREHENSIVE METABOLIC PANEL
ALT: 46 IU/L — ABNORMAL HIGH (ref 0–44)
AST: 38 IU/L (ref 0–40)
Albumin/Globulin Ratio: 1.8 (ref 1.2–2.2)
Albumin: 4.5 g/dL (ref 4.1–5.1)
Alkaline Phosphatase: 87 IU/L (ref 44–121)
BUN/Creatinine Ratio: 9 (ref 9–20)
BUN: 8 mg/dL (ref 6–20)
Bilirubin Total: 0.7 mg/dL (ref 0.0–1.2)
CO2: 24 mmol/L (ref 20–29)
Calcium: 9.4 mg/dL (ref 8.7–10.2)
Chloride: 103 mmol/L (ref 96–106)
Creatinine, Ser: 0.92 mg/dL (ref 0.76–1.27)
Globulin, Total: 2.5 g/dL (ref 1.5–4.5)
Glucose: 102 mg/dL — ABNORMAL HIGH (ref 70–99)
Potassium: 4 mmol/L (ref 3.5–5.2)
Sodium: 141 mmol/L (ref 134–144)
Total Protein: 7 g/dL (ref 6.0–8.5)
eGFR: 111 mL/min/{1.73_m2} (ref >59)

## 2022-11-23 LAB — CBC WITH DIFFERENTIAL/PLATELET
Basophils Absolute: 0.1 10*3/uL (ref 0.0–0.2)
Basos: 2 %
EOS (ABSOLUTE): 0.2 10*3/uL (ref 0.0–0.4)
Eos: 3 %
Hematocrit: 43.2 % (ref 37.5–51.0)
Hemoglobin: 14.5 g/dL (ref 13.0–17.7)
Immature Grans (Abs): 0 10*3/uL (ref 0.0–0.1)
Immature Granulocytes: 0 %
Lymphocytes Absolute: 2.6 10*3/uL (ref 0.7–3.1)
Lymphs: 47 %
MCH: 28.9 pg (ref 26.6–33.0)
MCHC: 33.6 g/dL (ref 31.5–35.7)
MCV: 86 fL (ref 79–97)
Monocytes Absolute: 0.5 10*3/uL (ref 0.1–0.9)
Monocytes: 9 %
Neutrophils Absolute: 2.1 10*3/uL (ref 1.4–7.0)
Neutrophils: 39 %
Platelets: 349 10*3/uL (ref 150–450)
RBC: 5.02 x10E6/uL (ref 4.14–5.80)
RDW: 12.4 % (ref 11.6–15.4)
WBC: 5.4 10*3/uL (ref 3.4–10.8)

## 2022-11-23 LAB — URINE DRUG PANEL 7
Amphetamines, Urine: NEGATIVE ng/mL
Barbiturate Quant, Ur: NEGATIVE ng/mL
Benzodiazepine Quant, Ur: NEGATIVE ng/mL
Cannabinoid Quant, Ur: NEGATIVE ng/mL
Cocaine (Metab.): NEGATIVE ng/mL
Opiate Quant, Ur: NEGATIVE ng/mL
PCP Quant, Ur: NEGATIVE ng/mL

## 2022-11-23 LAB — THYROID PANEL WITH TSH
Free Thyroxine Index: 1.5 (ref 1.2–4.9)
T3 Uptake Ratio: 26 % (ref 24–39)
T4, Total: 5.9 ug/dL (ref 4.5–12.0)
TSH: 1.07 u[IU]/mL (ref 0.450–4.500)

## 2022-11-23 LAB — LIPID PANEL
Chol/HDL Ratio: 3.3 ratio (ref 0.0–5.0)
Cholesterol, Total: 153 mg/dL (ref 100–199)
HDL: 47 mg/dL (ref >39)
LDL Chol Calc (NIH): 92 mg/dL (ref 0–99)
Triglycerides: 74 mg/dL (ref 0–149)
VLDL Cholesterol Cal: 14 mg/dL (ref 5–40)

## 2022-11-23 LAB — HEMOGLOBIN A1C
Est. average glucose Bld gHb Est-mCnc: 128 mg/dL
Hgb A1c MFr Bld: 6.1 % — ABNORMAL HIGH (ref 4.8–5.6)

## 2022-11-23 LAB — HEPATIC FUNCTION PANEL: Bilirubin, Direct: 0.18 mg/dL (ref 0.00–0.40)

## 2022-11-23 LAB — PROLACTIN: Prolactin: 45.2 ng/mL — ABNORMAL HIGH (ref 3.9–22.7)

## 2022-11-23 NOTE — Progress Notes (Cosign Needed)
PATINET PRESENTS TO THE OFFICE FOR INVEGA SUSTENNA 156 INJECTION GIVEN IN THE LEFT DELTOID BY Issacc Merlo MA , PATIENT DID WELL OVERALL WILL RETURN IN 28 DAYS  

## 2022-11-24 ENCOUNTER — Other Ambulatory Visit (HOSPITAL_COMMUNITY): Payer: Self-pay | Admitting: Psychiatry

## 2022-11-24 ENCOUNTER — Other Ambulatory Visit: Payer: Self-pay

## 2022-11-24 DIAGNOSIS — F900 Attention-deficit hyperactivity disorder, predominantly inattentive type: Secondary | ICD-10-CM

## 2022-11-24 DIAGNOSIS — R7989 Other specified abnormal findings of blood chemistry: Secondary | ICD-10-CM

## 2022-11-24 MED ORDER — AMPHETAMINE-DEXTROAMPHET ER 15 MG PO CP24
15.0000 mg | ORAL_CAPSULE | Freq: Every day | ORAL | 0 refills | Status: DC
Start: 2022-11-24 — End: 2022-11-24

## 2022-11-24 MED ORDER — AMPHETAMINE-DEXTROAMPHET ER 15 MG PO CP24
15.0000 mg | ORAL_CAPSULE | Freq: Every day | ORAL | 0 refills | Status: DC
Start: 2022-11-24 — End: 2022-12-21
  Filled 2022-11-24: qty 30, 30d supply, fill #0

## 2022-11-24 NOTE — Progress Notes (Signed)
Provider discussed with patient his elevated A1C, prolactin, and glucose level. Provider instructed patient to speak with his PCP about A1C level. Provider also informed patient that a referral was sent to endocrinology to further evaluate his increased prolactin. He endorsed understanding and agreed.

## 2022-11-25 ENCOUNTER — Other Ambulatory Visit: Payer: Self-pay

## 2022-12-02 ENCOUNTER — Other Ambulatory Visit: Payer: Self-pay

## 2022-12-12 ENCOUNTER — Other Ambulatory Visit: Payer: Self-pay

## 2022-12-16 ENCOUNTER — Other Ambulatory Visit: Payer: Self-pay

## 2022-12-21 ENCOUNTER — Other Ambulatory Visit (HOSPITAL_COMMUNITY): Payer: Self-pay | Admitting: Registered Nurse

## 2022-12-21 ENCOUNTER — Telehealth (HOSPITAL_COMMUNITY): Payer: Self-pay | Admitting: *Deleted

## 2022-12-21 ENCOUNTER — Other Ambulatory Visit (HOSPITAL_COMMUNITY): Payer: Self-pay | Admitting: Psychiatry

## 2022-12-21 ENCOUNTER — Encounter (HOSPITAL_COMMUNITY): Payer: Self-pay

## 2022-12-21 ENCOUNTER — Ambulatory Visit (INDEPENDENT_AMBULATORY_CARE_PROVIDER_SITE_OTHER): Payer: Medicaid Other | Admitting: *Deleted

## 2022-12-21 VITALS — BP 130/86 | HR 87 | Resp 18 | Ht 69.0 in | Wt 203.0 lb

## 2022-12-21 DIAGNOSIS — F200 Paranoid schizophrenia: Secondary | ICD-10-CM | POA: Diagnosis not present

## 2022-12-21 DIAGNOSIS — F209 Schizophrenia, unspecified: Secondary | ICD-10-CM

## 2022-12-21 DIAGNOSIS — F900 Attention-deficit hyperactivity disorder, predominantly inattentive type: Secondary | ICD-10-CM

## 2022-12-21 MED ORDER — AMPHETAMINE-DEXTROAMPHET ER 15 MG PO CP24
15.0000 mg | ORAL_CAPSULE | Freq: Every day | ORAL | 0 refills | Status: DC
Start: 2022-12-21 — End: 2023-01-19

## 2022-12-21 MED ORDER — INVEGA SUSTENNA 156 MG/ML IM SUSY
156.0000 mg | PREFILLED_SYRINGE | INTRAMUSCULAR | 11 refills | Status: DC
Start: 2022-12-21 — End: 2023-01-30

## 2022-12-21 NOTE — Progress Notes (Signed)
In with his girlfriend who is 8 months pregnant. He is here for his Hinda Glatter S 156mg  shot, given today in his R DELTOID. He would like to be on less shot because it makes him tired and would like his adderall increased because he is tired. Rx for adderall XL sent in per [facility] NP as he is out but no changes made to the dose or frequency. To discuss on next visit in 28 days with provider making any changes to his medicine. He is to return in 28 days.

## 2022-12-21 NOTE — Telephone Encounter (Signed)
Medication sent to [facility].  Patient also notes that he was in need of refills of Adderall.  Provider informed patient that Adderall was filled today by Ms. Rankin.  He endorsed understanding.  No other concerns noted at this time.

## 2022-12-21 NOTE — Telephone Encounter (Signed)
Patient was told that he needs to pick up his Gean Birchwood and bring it each visit. He states that he wants his medication delivered here and wants his medication to be sent to [facility] pharmacy since they will deliver. He will call tomorrow to place a card on file for the copay. Message sent to MD for prescription refill.

## 2022-12-26 ENCOUNTER — Other Ambulatory Visit: Payer: Self-pay

## 2022-12-26 ENCOUNTER — Telehealth (HOSPITAL_COMMUNITY): Payer: Self-pay | Admitting: Psychiatry

## 2022-12-26 ENCOUNTER — Other Ambulatory Visit (HOSPITAL_COMMUNITY): Payer: Self-pay | Admitting: Psychiatry

## 2022-12-26 DIAGNOSIS — F900 Attention-deficit hyperactivity disorder, predominantly inattentive type: Secondary | ICD-10-CM

## 2022-12-27 NOTE — Telephone Encounter (Signed)
Provider called pharmacy and his Adderall yesterday. Pharmacy staff notes that he picked it up yesterday. Provider called patient to notify him that the medication could not be refilled. Message left on patient voicemail.

## 2022-12-28 ENCOUNTER — Other Ambulatory Visit: Payer: Self-pay

## 2023-01-17 ENCOUNTER — Ambulatory Visit: Payer: Medicaid Other | Admitting: Critical Care Medicine

## 2023-01-19 ENCOUNTER — Encounter (HOSPITAL_COMMUNITY): Payer: Self-pay

## 2023-01-19 ENCOUNTER — Ambulatory Visit (INDEPENDENT_AMBULATORY_CARE_PROVIDER_SITE_OTHER): Payer: MEDICAID | Admitting: *Deleted

## 2023-01-19 ENCOUNTER — Other Ambulatory Visit (HOSPITAL_COMMUNITY): Payer: Self-pay | Admitting: Psychiatry

## 2023-01-19 ENCOUNTER — Ambulatory Visit (HOSPITAL_COMMUNITY): Payer: MEDICAID

## 2023-01-19 VITALS — BP 113/79 | HR 89 | Resp 16 | Ht 69.0 in | Wt 198.2 lb

## 2023-01-19 DIAGNOSIS — F900 Attention-deficit hyperactivity disorder, predominantly inattentive type: Secondary | ICD-10-CM

## 2023-01-19 DIAGNOSIS — F209 Schizophrenia, unspecified: Secondary | ICD-10-CM

## 2023-01-19 MED ORDER — AMPHETAMINE-DEXTROAMPHET ER 15 MG PO CP24: 15.0000 mg | ORAL_CAPSULE | Freq: Every day | ORAL | 0 refills | Status: DC

## 2023-01-19 NOTE — Progress Notes (Cosign Needed)
Patient arrived for his Gean Birchwood 156mg  injection. Given in Left Deltoid without issues or complaints. Asking for refill of Adderall. Pleasant, cooperative. No side effects noted.

## 2023-01-23 NOTE — Progress Notes (Signed)
New Patient Office Visit  Subjective    Patient ID: Devin Davis, male    DOB: 07-22-1985  Age: 37 y.o. MRN: [id number] CC:  Chief Complaint  Patient presents with   Medication Refill    Refills on viagra     HPI 09/14/22 Devin Davis presents to establish care This patient was seen by way of a telephone visit as I had to not be in the office on the day of his original appointment face-to-face.  Patient has a history of significant mental health with a schizophrenia diagnosis and depression.  He is manage by the [facility] behavioral health clinic last seen in February as documented below.  Patient states current medications which include the hydralazine, trazodone, Wellbutrin, Invega injections month have helped his mental health significantly.  He does not have any extraparametal side effects.  Patient does smoke some cigarettes.  He has erectile dysfunction would like medications for same.  History of hypertension but recent numbers have been normal.  The patient is not on blood pressure medication.  Below is the documentation from the 08/23/2022 visit with behavioral health.  Francesca Oman at [facility] 08/23/22 37 year old male seen today for follow-up psychiatric evaluation.  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  Currently he is managed on hydroxyzine 25 mg 3 times daily as needed, Wellbutrin 150 mg daily, trazodone 50 mg nightly as needed, and Invega Sustenna 156 mg monthly.  Patient informed writer that his medications are somewhat effective in managing his psychiatric conditions.   Today he is well-groomed, pleasant, cooperative, and engaged in conversation.  He informed Clinical research associate that he continues to have erectile dysfunction and notes that he believes it is [place] that is causing it.  Provider informed patient that Hinda Glatter could potentially cause erectile  dysfunction and informed him that if he wants to try oral pills or switch to a different antipsychotic he could be considered.  Patient was seen with his mother who notes that she believes that his erectile dysfunction as well as schizophrenia is stress-induced.  Provider educated patient and his mother on potential causes for schizophrenia as well as erectile dysfunction.  They endorsed understanding.  Provider informed patient that an oral form of the medication can be taken and tapered if needed.  He reports that at this time he would like to continue his current dose of Invega.   Patient informed Clinical research associate that he continues to have depressive episodes.  He notes that he will have the drive to do something and then will become depressed and be unable to complete task.  He informed Clinical research associate that he would like to get back to his self.  Today provider conducted GAD-7 and patient scored a 9, at his last visit he scored a 15.  Provider also conducted PHQ-9 patient scored a 10, at his last visit he scored a 16.  He endorses adequate sleep and appetite.  Today he denies SI/HI/VH, mania, paranoia.   Provider recommended increasing Wellbutrin to help manage depressive symptoms however patient was not agreeable.  At this time he does not wish to lower Invega but will continue all medications as prescribed.  Provider informed patient that another antipsychotic could be trialed in the future if he continues to have sexual dysfunction.  Patient also sees primary care doctor in March and provider recommended him discussing treatment for erectile dysfunction.  He endorsed understanding and agreement.  No other concerns at  this time.   Visit Diagnosis:      ICD-10-CM    1. Schizophrenia, unspecified type (HCC)  F20.9 buPROPion (WELLBUTRIN XL) 150 MG 24 hr tablet      traZODone (DESYREL) 50 MG tablet      paliperidone (INVEGA SUSTENNA) 156 MG/ML SUSY injection     2. Generalized anxiety disorder  F41.1 hydrOXYzine (ATARAX)  25 MG tablet     3. Well adult health check  Z00.00 hydrOXYzine (ATARAX) 25 MG tablet          Patient endorses hypersomnia and erectile dysfunction.  Provider informed patient that generally Hinda Glatter does not cause erectile dysfunction but informed him that if he wants to try oral pills or switch to a different antipsychotic he could be considered.  At this time patient request that medication not be adjusted.  He will continue all medications as prescribed.     1. Schizophrenia, unspecified type (HCC)   Continue- buPROPion (WELLBUTRIN XL) 150 MG 24 hr tablet; Take 1 tablet (150 mg total) by mouth every morning.  Dispense: 30 tablet; Refill: 1 Continue- traZODone (DESYREL) 50 MG tablet; Take 1 tablet (50 mg total) by mouth at bedtime as needed for sleep.  Dispense: 30 tablet; Refill: 3 - paliperidone (INVEGA SUSTENNA) 156 MG/ML SUSY injection; Inject 1 mL (156 mg total) into the muscle every 28 (twenty-eight) days.  Dispense: 1 mL; Refill: 11   2. Generalized anxiety disorder   Continue- hydrOXYzine (ATARAX) 25 MG tablet; Take 1 tablet (25 mg total) by mouth 3 (three) times daily as needed for anxiety.  Dispense: 90 tablet; Refill: 3   3. Well adult health check   Continue all cannot be completed- hydrOXYzine (ATARAX) 25 MG tablet; Take 1 tablet (25 mg total) by mouth 3 (three) times daily as needed for anxiety.  Dispense: 90 tablet; Refill: 3   09/22/22 Patient seen in return follow-up last was seen by way of telephone visit.  On arrival blood pressure 134/78.  He does smoke tobacco products.  He has attended with his mother.  He is not on medication for blood pressure.  He has schizophrenia is on Invega injections weekly hydroxyzine as needed trazodone as needed.  We did start Viagra for him at the last visit he states is effective for his erectile dysfunction.  The patient is inquiring if I will write a prescription for ADHD for a Strattera or Vyvanse.  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  Note he is also on Wellbutrin  01/24/23 This patient is seen in return follow-up and his only complaint is he wants refills on Viagra.  He is vaping nicotine goes through a cartridge a week.  There are no primary care gaps.  Blood pressure on arrival is excellent.  He is on multiple mental health medications followed closely by the mental health clinic.  All health is stable at this time.  He is on Adderall which is caused psychosis in the past he is being monitored closely by mental health for this. Outpatient Encounter Medications as of 01/24/2023  Medication Sig   amphetamine-dextroamphetamine (ADDERALL XR) 15 MG 24 hr capsule Take 1 capsule by mouth daily.   buPROPion (WELLBUTRIN XL) 300 MG 24 hr tablet Take 1 tablet (300 mg total) by mouth every morning.   hydrOXYzine (ATARAX) 25 MG tablet Take 1 tablet (25 mg total) by mouth 3 (  three) times daily as needed for anxiety.   paliperidone (INVEGA SUSTENNA) 156 MG/ML SUSY injection Inject 1 mL (156 mg total) into the muscle every 28 (twenty-eight) days.   traZODone (DESYREL) 50 MG tablet Take 1 tablet (50 mg total) by mouth at bedtime as needed for sleep.   [DISCONTINUED] omeprazole (PRILOSEC OTC) 20 MG tablet Take 20 mg by mouth daily.   [DISCONTINUED] sildenafil (VIAGRA) 100 MG tablet Take 0.5-1 tablets (50-100 mg total) by mouth daily as needed for erectile dysfunction.   sildenafil (VIAGRA) 100 MG tablet Take 0.5-1 tablets (50-100 mg total) by mouth daily as needed for erectile dysfunction.   Facility-Administered Encounter Medications as of 01/24/2023  Medication   paliperidone (INVEGA SUSTENNA) injection 156 mg    Past Medical History:  Diagnosis Date   Fracture of metacarpal of right hand, closed 08/04/2010   Comminuted fx @ base  of R 4th [facility]   GERD (gastroesophageal reflux disease)    Hypertension     Schizophrenia (HCC) 2023    History reviewed. No pertinent surgical history.  History reviewed. No pertinent family history.  Social History   Socioeconomic History   Marital status: Single    Spouse name: Not on file   Number of children: Not on file   Years of education: Not on file   Highest education level: Not on file  Occupational History   Not on file  Tobacco Use   Smoking status: Some Days    Types: E-cigarettes, Cigarettes   Smokeless tobacco: Never  Vaping Use   Vaping status: Every Day  Substance and Sexual Activity   Alcohol use: Yes    Alcohol/week: 4.0 standard drinks of alcohol    Types: 2 Cans of beer, 2 Shots of liquor per week    Comment: occ   Drug use: Not Currently   Sexual activity: Not Currently  Other Topics Concern   Not on file  Social History Narrative   Not on file   Social Determinants of Health   Financial Resource Strain: Not on file  Food Insecurity: Not on file  Transportation Needs: Unknown (03/11/2022)   PRAPARE - Administrator, Civil Service (Medical): Patient declined    Lack of Transportation (Non-Medical): Not on file  Physical Activity: Not on file  Stress: Not on file  Social Connections: Unknown (11/05/2021)   [facility] from [facility]   Social Network    Social Network: Not on file  Intimate Partner Violence: Patient Declined (03/11/2022)   Humiliation, Afraid, Rape, and Kick questionnaire    Fear of Current or Ex-Partner: Patient declined    Emotionally Abused: Patient declined    Physically Abused: Patient declined    Sexually Abused: Patient declined    Review of Systems  Constitutional:  Negative for chills, diaphoresis, fever, malaise/fatigue and weight loss.  HENT:  Negative for congestion, hearing loss, nosebleeds, sore throat and tinnitus.   Eyes:  Negative for blurred vision, photophobia and redness.  Respiratory:  Negative for cough, hemoptysis, sputum production, shortness of breath, wheezing  and stridor.   Cardiovascular:  Negative for chest pain, palpitations, orthopnea, claudication, leg swelling and PND.  Gastrointestinal:  Negative for abdominal pain, blood in stool, constipation, diarrhea, heartburn, nausea and vomiting.  Genitourinary:  Negative for dysuria, flank pain, frequency, hematuria and urgency.       Erectile dysfunction  Musculoskeletal:  Negative for back pain, falls, joint pain, myalgias and neck pain.  Skin:  Negative for itching and rash.  Neurological:  Negative for dizziness, tingling, tremors, sensory change, speech change, focal weakness, seizures, loss of consciousness, weakness and headaches.  Endo/Heme/Allergies:  Negative for environmental allergies and polydipsia. Does not bruise/bleed easily.  Psychiatric/Behavioral:  Negative for depression, memory loss, substance abuse and suicidal ideas. The patient is not nervous/anxious and does not have insomnia.         Objective   No exam this is a phone visit BP 125/81 (BP Location: Right Arm, Patient Position: Sitting, Cuff Size: Normal)   Pulse (!) 110   Ht 5\' 9"  (1.753 m)   Wt 199 lb 3.2 oz (90.4 kg)   SpO2 100%   BMI 29.42 kg/m   Physical Exam Vitals reviewed.  Constitutional:      Appearance: Normal appearance. He is well-developed. He is not diaphoretic.  HENT:     Head: Normocephalic and atraumatic.     Nose: No nasal deformity, septal deviation, mucosal edema or rhinorrhea.     Right Sinus: No maxillary sinus tenderness or frontal sinus tenderness.     Left Sinus: No maxillary sinus tenderness or frontal sinus tenderness.     Mouth/Throat:     Pharynx: No oropharyngeal exudate.  Eyes:     General: No scleral icterus.    Conjunctiva/sclera: Conjunctivae normal.     Pupils: Pupils are equal, round, and reactive to light.  Neck:     Thyroid: No thyromegaly.     Vascular: No carotid bruit or JVD.     Trachea: Trachea normal. No tracheal tenderness or tracheal deviation.   Cardiovascular:     Rate and Rhythm: Normal rate and regular rhythm.     Chest Wall: PMI is not displaced.     Pulses: Normal pulses. No decreased pulses.     Heart sounds: Normal heart sounds, S1 normal and S2 normal. Heart sounds not distant. No murmur heard.    No systolic murmur is present.     No diastolic murmur is present.     No friction rub. No gallop. No S3 or S4 sounds.  Pulmonary:     Effort: No tachypnea, accessory muscle usage or respiratory distress.     Breath sounds: No stridor. No decreased breath sounds, wheezing, rhonchi or rales.  Chest:     Chest wall: No tenderness.  Abdominal:     General: Bowel sounds are normal. There is no distension.     Palpations: Abdomen is soft. Abdomen is not rigid.     Tenderness: There is no abdominal tenderness. There is no guarding or rebound.  Musculoskeletal:        General: Normal range of motion.     Cervical back: Normal range of motion and neck supple. No edema, erythema or rigidity. No muscular tenderness. Normal range of motion.  Lymphadenopathy:     Head:     Right side of head: No submental or submandibular adenopathy.     Left side of head: No submental or submandibular adenopathy.     Cervical: No cervical adenopathy.  Skin:    General: Skin is warm and dry.     Coloration: Skin is not pale.     Findings: No rash.     Nails: There is no clubbing.  Neurological:     Mental Status: He is alert and oriented to person, place, and time.     Sensory: No sensory deficit.  Psychiatric:        Mood and Affect: Mood normal.        Speech: Speech normal.  Behavior: Behavior normal.        Thought Content: Thought content normal.        Judgment: Judgment normal.         Assessment & Plan:  Screening labs will be obtained Problem List Items Addressed This Visit       Cardiovascular and Mediastinum   Hypertension - Primary    Stable off medications monitor      Relevant Medications   sildenafil  (VIAGRA) 100 MG tablet     Nervous and Auditory   Amphetamine and psychostimulant-induced psychosis with delusions (HCC)    Continue management per behavioral health        Other   Erectile dysfunction    Refill of Viagra sent       Return in about 6 months (around 07/27/2023) for primary care follow up.

## 2023-01-24 ENCOUNTER — Encounter: Payer: Self-pay | Admitting: Critical Care Medicine

## 2023-01-24 ENCOUNTER — Other Ambulatory Visit: Payer: Self-pay

## 2023-01-24 ENCOUNTER — Ambulatory Visit: Payer: Self-pay | Attending: Critical Care Medicine | Admitting: Critical Care Medicine

## 2023-01-24 VITALS — BP 125/81 | HR 110 | Ht 69.0 in | Wt 199.2 lb

## 2023-01-24 DIAGNOSIS — I10 Essential (primary) hypertension: Secondary | ICD-10-CM

## 2023-01-24 DIAGNOSIS — N529 Male erectile dysfunction, unspecified: Secondary | ICD-10-CM | POA: Insufficient documentation

## 2023-01-24 DIAGNOSIS — F15950 Other stimulant use, unspecified with stimulant-induced psychotic disorder with delusions: Secondary | ICD-10-CM

## 2023-01-24 MED ORDER — SILDENAFIL CITRATE 100 MG PO TABS
50.0000 mg | ORAL_TABLET | Freq: Every day | ORAL | 11 refills | Status: AC | PRN
  Filled 2023-01-24: qty 10, 30d supply, fill #0
  Filled 2023-02-02: qty 20, 20d supply, fill #0
  Filled 2023-04-19: qty 20, 20d supply, fill #1
  Filled 2023-06-15: qty 20, 20d supply, fill #2

## 2023-01-24 NOTE — Assessment & Plan Note (Signed)
Stable off medications monitor

## 2023-01-24 NOTE — Assessment & Plan Note (Signed)
Continue management per behavioral health

## 2023-01-24 NOTE — Assessment & Plan Note (Signed)
Refill of Viagra sent

## 2023-01-24 NOTE — Patient Instructions (Signed)
Viagra refilled  No labs today We discussed more aerobic activity  walking 30 min 4-5 times a week Try to reduce your vaping intake REturn 6  months

## 2023-01-30 ENCOUNTER — Ambulatory Visit (HOSPITAL_COMMUNITY): Payer: MEDICAID | Admitting: Psychiatry

## 2023-01-30 ENCOUNTER — Encounter (HOSPITAL_COMMUNITY): Payer: Self-pay | Admitting: Psychiatry

## 2023-01-30 DIAGNOSIS — F209 Schizophrenia, unspecified: Secondary | ICD-10-CM

## 2023-01-30 DIAGNOSIS — F900 Attention-deficit hyperactivity disorder, predominantly inattentive type: Secondary | ICD-10-CM | POA: Diagnosis not present

## 2023-01-30 DIAGNOSIS — F411 Generalized anxiety disorder: Secondary | ICD-10-CM | POA: Diagnosis not present

## 2023-01-30 DIAGNOSIS — Z0000 Encounter for general adult medical examination without abnormal findings: Secondary | ICD-10-CM

## 2023-01-30 MED ORDER — TRAZODONE HCL 50 MG PO TABS
50.0000 mg | ORAL_TABLET | Freq: Every evening | ORAL | 3 refills | Status: DC | PRN
Start: 2023-01-30 — End: 2023-06-15
  Filled 2023-02-02: qty 30, 30d supply, fill #0
  Filled 2023-04-19: qty 30, 30d supply, fill #1
  Filled 2023-06-15: qty 30, 30d supply, fill #2

## 2023-01-30 MED ORDER — BUPROPION HCL ER (XL) 300 MG PO TB24
300.0000 mg | ORAL_TABLET | ORAL | 3 refills | Status: DC
Start: 2023-01-30 — End: 2023-06-15
  Filled 2023-02-02: qty 30, 30d supply, fill #0
  Filled 2023-04-19: qty 30, 30d supply, fill #1
  Filled 2023-06-15: qty 30, 30d supply, fill #2

## 2023-01-30 MED ORDER — HYDROXYZINE HCL 25 MG PO TABS
25.0000 mg | ORAL_TABLET | Freq: Three times a day (TID) | ORAL | 3 refills | Status: DC | PRN
Start: 2023-01-30 — End: 2023-06-15
  Filled 2023-02-02: qty 90, 30d supply, fill #0
  Filled 2023-04-19: qty 90, 30d supply, fill #1
  Filled 2023-06-15: qty 90, 30d supply, fill #2

## 2023-01-30 MED ORDER — INVEGA SUSTENNA 156 MG/ML IM SUSY
156.0000 mg | PREFILLED_SYRINGE | INTRAMUSCULAR | 11 refills | Status: DC
Start: 2023-01-30 — End: 2023-06-15

## 2023-01-30 NOTE — Progress Notes (Signed)
BH MD/PA/NP OP Progress Note  01/30/2023 2:01 PM Devin Davis  MRN:  [id number]  Chief Complaint: "My mood is even but I have been tired" Per mother "He needs proof of his mental health for his disability"  HPI: 37 year old male seen today for follow-up psychiatric evaluation.  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  Currently he is managed on hydroxyzine 25 mg 3 times daily as needed, Wellbutrin 300 mg daily, trazodone 50 mg nightly as needed, Adderall XR 15 mg daily, and Invega Sustenna 156 mg monthly.  Patient informed writer that his medications are somewhat effective in managing his psychiatric conditions.  Today he is well-groomed, pleasant, cooperative, and engaged in conversation.  He informed Clinical research associate that his mood is stable but notes that he continues to be fatigued. He reports that due to his chronic fatigue he is having issues working daily. He notes that he works once a week at [employer]. Patient notes that recently he had a daughter Wille Glaser (who is 3 weeks). He reports that she and his child's mother are doing well. At times he notes that he is anxious about being a dad, finances, and his health. Today provider conducted a GAD 7 and patient scored an 11, at his last visit he scored a 10. Provider also conducted a PHQ 9 and patient scored a 15, at his last visit he scored a 9. He endorses increased sleep noting that he sleeps 12 or more hours nightly. Today he denies SI/HI/VAH. He does note that in social settings he feels paranoid and at time has racing thoughts and distractibility.   Patient notes that his concentration continues to improve. He requested that addreall be increased. Provider informed patient that adderall will not be increased at this time. He endorsed understanding and agreed.    Patient requested writer call his mother. Patient mother requested  that his mental health be documented to present to his disability case worker. She also notes that he lacks drive. She notes that he sleeps his day away and is concerned about his increasing depression. Provider spoke to Ms. Brooks patient disability case worker who requested patients medical records. Provider was agreeable to print them for patient.    Patient notes that he feels that his Hinda Glatter does maybe to high. Provider informed patient that the does could be reduced to 117 mg monthly. He he notes however that he would like to wait until the end of the year to reduce his dose. Provider endorsed understanding and agreed.  Patient also notes that he does not take his Wellbutrin every day. Provider informed patient to take his medication and ordered to help manage his depressive symptoms and ADHD. He endorsed understanding and agreed. No other concerns at this time.    Visit Diagnosis:    ICD-10-CM   1. Attention deficit hyperactivity disorder (ADHD), predominantly inattentive type  F90.0     2. Schizophrenia, unspecified type (HCC)  F20.9 buPROPion (WELLBUTRIN XL) 300 MG 24 hr tablet    paliperidone (INVEGA SUSTENNA) 156 MG/ML SUSY injection    traZODone (DESYREL) 50 MG tablet    3. Generalized anxiety disorder  F41.1 hydrOXYzine (ATARAX) 25 MG tablet    4. Well adult health check  Z00.00 hydrOXYzine (ATARAX) 25 MG tablet        Past Psychiatric History: Amphetamine psychostimulant induced psychosis with delusions, substance-induced psychotic disorder, psychosis, schizophrenia, and tobacco use  Past Medical  History:  Past Medical History:  Diagnosis Date   Fracture of metacarpal of right hand, closed 08/04/2010   Comminuted fx @ base  of [facility]   GERD (gastroesophageal reflux disease)    Hypertension    Schizophrenia (HCC) 2023   No past surgical history on file.  Family Psychiatric History: Maternal grandmother schizophrenia, maternal aunt maternal uncle  schizophrenia  Family History: No family history on file.  Social History:  Social History   Socioeconomic History   Marital status: Single    Spouse name: Not on file   Number of children: Not on file   Years of education: Not on file   Highest education level: Not on file  Occupational History   Not on file  Tobacco Use   Smoking status: Some Days    Types: E-cigarettes, Cigarettes   Smokeless tobacco: Never  Vaping Use   Vaping status: Every Day  Substance and Sexual Activity   Alcohol use: Yes    Alcohol/week: 4.0 standard drinks of alcohol    Types: 2 Cans of beer, 2 Shots of liquor per week    Comment: occ   Drug use: Not Currently   Sexual activity: Not Currently  Other Topics Concern   Not on file  Social History Narrative   Not on file   Social Determinants of Health   Financial Resource Strain: Not on file  Food Insecurity: Not on file  Transportation Needs: Unknown (03/11/2022)   PRAPARE - Administrator, Civil Service (Medical): Patient declined    Lack of Transportation (Non-Medical): Not on file  Physical Activity: Not on file  Stress: Not on file  Social Connections: Unknown (11/05/2021)   Received from [facility]   Social Network    Social Network: Not on file    Allergies: No Known Allergies  Metabolic Disorder Labs: Lab Results  Component Value Date   HGBA1C 6.1 (H) 11/22/2022   MPG 119.76 03/13/2022   MPG 114.02 10/13/2021   Lab Results  Component Value Date   PROLACTIN 45.2 (H) 11/22/2022   PROLACTIN 1.8 (L) 10/13/2021   Lab Results  Component Value Date   CHOL 153 11/22/2022   TRIG 74 11/22/2022   HDL 47 11/22/2022   CHOLHDL 3.3 11/22/2022   VLDL 13 03/13/2022   LDLCALC 92 11/22/2022   LDLCALC 87 03/13/2022   Lab Results  Component Value Date   TSH 1.070 11/22/2022   TSH 1.880 09/22/2022    Therapeutic Level Labs: No results found for: "LITHIUM" No results found for: "VALPROATE" No results found for:  "CBMZ"  Current Medications: Current Outpatient Medications  Medication Sig Dispense Refill   amphetamine-dextroamphetamine (ADDERALL XR) 15 MG 24 hr capsule Take 1 capsule by mouth daily. 30 capsule 0   buPROPion (WELLBUTRIN XL) 300 MG 24 hr tablet Take 1 tablet (300 mg total) by mouth every morning. 30 tablet 3   hydrOXYzine (ATARAX) 25 MG tablet Take 1 tablet (25 mg total) by mouth 3 (three) times daily as needed for anxiety. 90 tablet 3   paliperidone (INVEGA SUSTENNA) 156 MG/ML SUSY injection Inject 1 mL (156 mg total) into the muscle every 28 (twenty-eight) days. 1 mL 11   sildenafil (VIAGRA) 100 MG tablet Take 0.5-1 tablets (50-100 mg total) by mouth daily as needed for erectile dysfunction. 20 tablet 11   traZODone (DESYREL) 50 MG tablet Take 1 tablet (50 mg total) by mouth at bedtime as needed for sleep. 30 tablet 3   Current  Facility-Administered Medications  Medication Dose Route Frequency Provider Last Rate Last Admin   paliperidone (INVEGA SUSTENNA) injection 156 mg  156 mg Intramuscular Q28 days Karsten Ro, MD   156 mg at 01/19/23 1441     Musculoskeletal: Strength & Muscle Tone: within normal limits Gait & Station: normal Patient leans: N/A  Psychiatric Specialty Exam: Review of Systems  There were no vitals taken for this visit.There is no height or weight on file to calculate BMI.  General Appearance: Well Groomed  Eye Contact:  Good  Speech:  Clear and Coherent and Normal Rate  Volume:  Normal  Mood:  Anxious  Affect:  Appropriate and Congruent  Thought Process:  Coherent, Goal Directed, and Linear  Orientation:  Full (Time, Place, and Person)  Thought Content: WDL and Logical   Suicidal Thoughts:  No  Homicidal Thoughts:  No  Memory:  Immediate;   Good Recent;   Good Remote;   Good  Judgement:  Good  Insight:  Good  Psychomotor Activity:  Normal  Concentration:  Concentration: Fair and Attention Span: Fair  Recall:  Good  Fund of Knowledge: Good   Language: Good  Akathisia:  No  Handed:  Right  AIMS (if indicated): not done  Assets:  Communication Skills Desire for Improvement Financial Resources/Insurance Housing Physical Health Social Support Talents/Skills  ADL's:  Intact  Cognition: WNL  Sleep:  Fair   Screenings: AIMS    Flowsheet Row Office Visit from 09/28/2022 in [facility]) [facility] ([facility]) [facility]  AIMS Total Score 0 0 0      GAD-7    Flowsheet Row Clinical Support from 01/30/2023 in [facility] Office Visit from 01/24/2023 in [facility] Clinical Support from 11/22/2022 in [facility] Clinical Support from 11/03/2022 in [facility] Office Visit from 09/22/2022 in [facility]  Total GAD-7 Score 11 10 10 11 12       PHQ2-9    Flowsheet Row Clinical Support from 01/30/2023 in [facility] Office Visit from 01/24/2023 in [facility] Clinical Support from 11/22/2022 in [facility] Clinical Support from 11/03/2022 in [facility] Office Visit from 09/22/2022 in [facility]  PHQ-2 Total Score 3 3 4 4 5   PHQ-9 Total Score 15 12 14 17 16       Flowsheet Row Clinical Support from 01/30/2023 in [facility] Clinical Support from 11/22/2022 in [facility] Clinical Support from 11/03/2022 in [facility]  C-SSRS RISK CATEGORY Error: Q3, 4, or 5 should not be populated when Q2 is No Error: Q3, 4, or 5 should not be populated when Q2 is No Error: Q3, 4, or 5 should not be  populated when Q2 is No        Assessment and Plan: Patient endorses hypersomnia, anxiety, and depression. Patient notes that he feels that his Hinda Glatter does maybe to high. Provider informed patient that the does could be reduced to 117 mg monthly. He he notes however that he would like to wait until the end of the year to reduce his dose. Provider endorsed understanding and agreed.  Patient also notes that he does not take  his Wellbutrin every day. Provider informed patient to take his medication and ordered to help manage his depressive symptoms and ADHD. He endorsed understanding and agreed.  Adderall not filled at this time as it was recently filled on 01/22/2023 written on 01/19/2023.  1. Attention deficit hyperactivity disorder (ADHD), predominantly inattentive type   2. Schizophrenia, unspecified type (HCC)  - buPROPion (WELLBUTRIN XL) 300 MG 24 hr tablet; Take 1 tablet (300 mg total) by mouth every morning.  Dispense: 30 tablet; Refill: 3 Continue- paliperidone (INVEGA SUSTENNA) 156 MG/ML SUSY injection; Inject 1 mL (156 mg total) into the muscle every 28 (twenty-eight) days.  Dispense: 1 mL; Refill: 11 Continue- traZODone (DESYREL) 50 MG tablet; Take 1 tablet (50 mg total) by mouth at bedtime as needed for sleep.  Dispense: 30 tablet; Refill: 3  3. Generalized anxiety disorder  Continue- hydrOXYzine (ATARAX) 25 MG tablet; Take 1 tablet (25 mg total) by mouth 3 (three) times daily as needed for anxiety.  Dispense: 90 tablet; Refill: 3  4. Well adult health check  Continue- hydrOXYzine (ATARAX) 25 MG tablet; Take 1 tablet (25 mg total) by mouth 3 (three) times daily as needed for anxiety.  Dispense: 90 tablet; Refill: 3    Follow-up in 3 weeks Shanna Cisco, NP 01/30/2023, 2:01 PM

## 2023-01-31 ENCOUNTER — Other Ambulatory Visit: Payer: Self-pay

## 2023-02-02 ENCOUNTER — Other Ambulatory Visit: Payer: Self-pay

## 2023-02-16 ENCOUNTER — Telehealth (HOSPITAL_COMMUNITY): Payer: Self-pay

## 2023-02-16 ENCOUNTER — Ambulatory Visit (INDEPENDENT_AMBULATORY_CARE_PROVIDER_SITE_OTHER): Payer: MEDICAID

## 2023-02-16 ENCOUNTER — Encounter (HOSPITAL_COMMUNITY): Payer: Self-pay

## 2023-02-16 VITALS — BP 135/84 | HR 81 | Ht 69.0 in | Wt 195.6 lb

## 2023-02-16 DIAGNOSIS — F200 Paranoid schizophrenia: Secondary | ICD-10-CM

## 2023-02-16 DIAGNOSIS — F411 Generalized anxiety disorder: Secondary | ICD-10-CM

## 2023-02-16 DIAGNOSIS — G4700 Insomnia, unspecified: Secondary | ICD-10-CM

## 2023-02-16 DIAGNOSIS — F209 Schizophrenia, unspecified: Secondary | ICD-10-CM

## 2023-02-16 NOTE — Progress Notes (Signed)
PATINET PRESENTS TO THE OFFICE FOR INVEGA SUSTENNA 156 INJECTION GIVEN IN THE RIGHT DELTOID BY DONNA BRIDGES MA , PATIENT DID WELL OVERALL WILL RETURN IN 28 DAYS

## 2023-02-17 ENCOUNTER — Other Ambulatory Visit (HOSPITAL_COMMUNITY): Payer: Self-pay | Admitting: Psychiatry

## 2023-02-17 DIAGNOSIS — F900 Attention-deficit hyperactivity disorder, predominantly inattentive type: Secondary | ICD-10-CM

## 2023-02-17 MED ORDER — AMPHETAMINE-DEXTROAMPHET ER 15 MG PO CP24: 15.0000 mg | ORAL_CAPSULE | Freq: Every day | ORAL | 0 refills | Status: DC

## 2023-02-17 NOTE — Telephone Encounter (Signed)
Medication refilled and sent to preferred pharmacy

## 2023-03-12 ENCOUNTER — Emergency Department (HOSPITAL_BASED_OUTPATIENT_CLINIC_OR_DEPARTMENT_OTHER)
Admission: EM | Admit: 2023-03-12 | Discharge: 2023-03-12 | Disposition: A | Discharge status: Home / Self Care | Payer: MEDICAID | Attending: Emergency Medicine | Admitting: Emergency Medicine

## 2023-03-12 DIAGNOSIS — Z711 Person with feared health complaint in whom no diagnosis is made: Secondary | ICD-10-CM | POA: Insufficient documentation

## 2023-03-12 DIAGNOSIS — H6123 Impacted cerumen, bilateral: Secondary | ICD-10-CM | POA: Diagnosis present

## 2023-03-12 NOTE — Discharge Instructions (Addendum)
Fortunately you do not have any symptoms concerning for infection that would require antibiotic.  For your earwax, you may apply a mixture of hydroperoxide and water into each ear canal in the morning, leave it in for 5 minutes and then shower.  Do this for a week to help loosen up your earwax.  Return to ER if you develop fever, skin rash, or if you have other concern.

## 2023-03-12 NOTE — ED Provider Notes (Signed)
[place] EMERGENCY DEPARTMENT AT [facility] Ed Provider Note   CSN: [id number] Arrival date & time: 03/12/23  2002     History  Chief Complaint  Patient presents with   Insect Bite    Devin Davis is a 37 y.o. male.  The history is provided by the patient. No language interpreter was used.     37 year old male significant history of schizophrenia, generalized anxiety disorder, ADHD, presenting to ED with concerns of infection.  Patient states his girlfriend was having some itchiness in his skin for approximately 3 weeks.  She went to the hospital a few weeks ago and she was told that she has some scabs in her right ear and she was subsequently prescribed this cephalexin.  Patient states he does not have any active symptoms but would like to receive antibiotic as well because he worries that he may have some bugs from her.  He denies any ear discomfort denies any skin changes no sore throat chest pain shortness of breath abdominal cramping fever chills body aches or headache.  No recent travel, eating exotic food, or outside camping.  Home Medications Prior to Admission medications   Medication Sig Start Date End Date Taking? Authorizing Provider  omeprazole (PRILOSEC OTC) 20 MG tablet Take 20 mg by mouth daily.  02/29/20  [provider]  amphetamine-dextroamphetamine (ADDERALL XR) 15 MG 24 hr capsule Take 1 capsule by mouth daily. 02/17/23   Shanna Cisco, NP  buPROPion (WELLBUTRIN XL) 300 MG 24 hr tablet Take 1 tablet (300 mg total) by mouth every morning. 01/30/23   Shanna Cisco, NP  hydrOXYzine (ATARAX) 25 MG tablet Take 1 tablet (25 mg total) by mouth 3 (three) times daily as needed for anxiety. 01/30/23   Shanna Cisco, NP  paliperidone (INVEGA SUSTENNA) 156 MG/ML SUSY injection Inject 1 mL (156 mg total) into the muscle every 28 (twenty-eight) days. 01/30/23   Shanna Cisco, NP  sildenafil (VIAGRA) 100 MG tablet Take 0.5-1 tablets  (50-100 mg total) by mouth daily as needed for erectile dysfunction. 01/24/23   Storm Frisk, MD  traZODone (DESYREL) 50 MG tablet Take 1 tablet (50 mg total) by mouth at bedtime as needed for sleep. 01/30/23   Shanna Cisco, NP      Allergies    Patient has no known allergies.    Review of Systems   Review of Systems  All other systems reviewed and are negative.   Physical Exam Updated Vital Signs BP 121/71 (BP Location: Right Arm)   Pulse 93   Temp 98 F (36.7 C)   Resp 18   Wt 90.7 kg   SpO2 100%   BMI 29.53 kg/m  Physical Exam Vitals and nursing note reviewed.  Constitutional:      General: He is not in acute distress.    Appearance: He is well-developed.  HENT:     Head: Atraumatic.     Ears:     Comments: Patient has evidence of cerumen impaction involving both ear canals    Mouth/Throat:     Mouth: Mucous membranes are moist.  Eyes:     Conjunctiva/sclera: Conjunctivae normal.  Cardiovascular:     Rate and Rhythm: Normal rate and regular rhythm.     Pulses: Normal pulses.     Heart sounds: Normal heart sounds.  Pulmonary:     Effort: Pulmonary effort is normal.     Breath sounds: Normal breath sounds. No wheezing, rhonchi or rales.  Abdominal:  Palpations: Abdomen is soft.  Musculoskeletal:     Cervical back: Neck supple.  Skin:    Findings: No rash.  Neurological:     Mental Status: He is alert. Mental status is at baseline.  Psychiatric:        Mood and Affect: Mood normal.     ED Results / Procedures / Treatments   Labs (all labs ordered are listed, but only abnormal results are displayed) Labs Reviewed - No data to display  EKG None  Radiology No results found.  Procedures Procedures    Medications Ordered in ED Medications - No data to display  ED Course/ Medical Decision Making/ A&P                                 Medical Decision Making  BP 121/71 (BP Location: Right Arm)   Pulse 93   Temp 98 F (36.7 C)    Resp 18   Wt 90.7 kg   SpO2 100%   BMI 29.53 kg/m   26:2 PM 37 year old male significant history of schizophrenia, generalized anxiety disorder, ADHD, presenting to ED with concerns of infection.  Patient states his girlfriend was having some itchiness in his skin for approximately 3 weeks.  She went to the hospital a few weeks ago and she was told that she has some scabs in her right ear and she was subsequently prescribed this cephalexin.  Patient states he does not have any active symptoms but would like to receive antibiotic as well because he worries that he may have some bugs from her.  He denies any ear discomfort denies any skin changes no sore throat chest pain shortness of breath abdominal cramping fever chills body aches or headache.  No recent travel, eating exotic food, or outside camping.  On exam this is a well-appearing male resting comfortably in bed appears to be in no acute discomfort.  Patient does not have any concerning skin changes cellulitis.  Ear exam with cerumen impaction involving both clear but no tenderness.  Ear nose throat unremarkable except for some impaction.  I gave patient reassurance that he does not need antibiotic at this time.  I recommend patient to use hydroperoxide to help loosen earwax and avoidance of using Q-tip.  I gave patient return precaution otherwise he is stable for discharge.  Return precaution given.  Doubt psychosis.  Doubt cellulitis.        Final Clinical Impression(s) / ED Diagnoses Final diagnoses:  Worried well    Rx / DC Orders ED Discharge Orders     None         Fayrene Helper, PA-C 03/12/23 2052    Melene Plan, DO 03/12/23 2100

## 2023-03-12 NOTE — ED Triage Notes (Signed)
Pt to triage with NO chief complaint. PT denies any symptoms of acute illness at this time. PT  states "my wife was here a week ago and treated for bugs, I want to get checked" VSS NAD PT on room air.

## 2023-03-16 ENCOUNTER — Ambulatory Visit (HOSPITAL_COMMUNITY): Payer: MEDICAID

## 2023-03-22 ENCOUNTER — Telehealth (HOSPITAL_COMMUNITY): Payer: Self-pay | Admitting: Psychiatry

## 2023-03-22 ENCOUNTER — Encounter (HOSPITAL_COMMUNITY): Payer: Self-pay

## 2023-03-22 ENCOUNTER — Ambulatory Visit (INDEPENDENT_AMBULATORY_CARE_PROVIDER_SITE_OTHER): Payer: MEDICAID | Admitting: *Deleted

## 2023-03-22 VITALS — BP 120/81 | HR 86 | Resp 16 | Ht 69.0 in | Wt 192.8 lb

## 2023-03-22 DIAGNOSIS — F209 Schizophrenia, unspecified: Secondary | ICD-10-CM | POA: Diagnosis not present

## 2023-03-22 DIAGNOSIS — F200 Paranoid schizophrenia: Secondary | ICD-10-CM | POA: Diagnosis not present

## 2023-03-22 NOTE — Progress Notes (Cosign Needed)
Patient arrived with his mother for his injection of Invega 156mg . Given in Left Deltoid without issue or complaint. Denies SI/HI or AV hallucinations. Pleasant, cooperative Will return in 28 days.

## 2023-03-23 ENCOUNTER — Other Ambulatory Visit (HOSPITAL_COMMUNITY): Payer: Self-pay | Admitting: Psychiatry

## 2023-03-23 ENCOUNTER — Telehealth (HOSPITAL_COMMUNITY): Payer: Self-pay

## 2023-03-23 DIAGNOSIS — F900 Attention-deficit hyperactivity disorder, predominantly inattentive type: Secondary | ICD-10-CM

## 2023-03-23 MED ORDER — AMPHETAMINE-DEXTROAMPHET ER 15 MG PO CP24: 15.0000 mg | ORAL_CAPSULE | Freq: Every day | ORAL | 0 refills | Status: DC

## 2023-03-23 NOTE — Telephone Encounter (Signed)
Medication sent to preferred pharmacy

## 2023-03-23 NOTE — Telephone Encounter (Signed)
Medication sent to preferred pharmacy

## 2023-04-07 ENCOUNTER — Telehealth (HOSPITAL_COMMUNITY): Payer: Self-pay | Admitting: *Deleted

## 2023-04-07 ENCOUNTER — Telehealth: Payer: Self-pay

## 2023-04-07 NOTE — Telephone Encounter (Signed)
Medication management - Prior authorization for pt's prescribed Invega Sustenna 156 mg/ml IM submitted online through CoverMyMeds and sent to [facility] for review and decision.

## 2023-04-07 NOTE — Telephone Encounter (Signed)
Fax received for approval of Gean Birchwood 156mg  until 04/06/24. Called to notify pharmacy.

## 2023-04-10 NOTE — Telephone Encounter (Signed)
Thank you for this update 

## 2023-04-13 ENCOUNTER — Ambulatory Visit (HOSPITAL_COMMUNITY): Payer: MEDICAID

## 2023-04-19 ENCOUNTER — Ambulatory Visit (INDEPENDENT_AMBULATORY_CARE_PROVIDER_SITE_OTHER): Payer: MEDICAID | Admitting: *Deleted

## 2023-04-19 ENCOUNTER — Encounter (HOSPITAL_COMMUNITY): Payer: Self-pay

## 2023-04-19 ENCOUNTER — Ambulatory Visit (INDEPENDENT_AMBULATORY_CARE_PROVIDER_SITE_OTHER): Payer: MEDICAID | Admitting: Family

## 2023-04-19 ENCOUNTER — Other Ambulatory Visit: Payer: Self-pay

## 2023-04-19 VITALS — BP 129/86 | HR 99 | Resp 16 | Ht 69.0 in | Wt 195.8 lb

## 2023-04-19 DIAGNOSIS — F200 Paranoid schizophrenia: Secondary | ICD-10-CM

## 2023-04-19 DIAGNOSIS — F209 Schizophrenia, unspecified: Secondary | ICD-10-CM

## 2023-04-19 NOTE — Progress Notes (Unsigned)
BH MD/PA/NP OP Progress Note  04/20/2023 9:08 AM Devin Davis  MRN:  [id number]  Chief Complaint: " I've been good."   HPI: Devin Davis 37 year old African-American male presents to the injection clinic for monthly Invega Sustenna 156 mg.  Patient is accompanied to this appointment by his mother.  He provided verbal authorization for her to stay during this assessment.   Devin Davis carries a diagnosis with amphetamine induced psychosis with delusions, schizophrenia, generalized anxiety disorder, major depressive disorder and attention deficit disorder.  Currently prescribed Invega 156 mg  (monthly injection.) Wellbutrin 300 mg, Adderall  15 mg and trazodone 50 mg   [name] has concerns related to dosage. He states he is hopeful to continue to decrease his dose with plans to be completely off of medication by next year.  Denied any medication side effects at this visit.  He reports some depression symptoms which he states " comes and goes."  Patient reports  he takes Wellbutrin intermittently.  States only when "I feel a anxious."  Education provided with taking medications as indicated daily.  He denied suicidal or homicidal ideations.  Denies auditory or visual hallucinations.  States states hallucinations have subsided with medications.  Reports a good appetite.  States he is resting well throughout the night.  Chart reviewed 5/21 elevated prolactin 45.2, A1c 6.1-consideration for metformin.-Continue with recommendations noted by [name] for primary care follow-up.    [name] is sitting ; he is alert/oriented x 3; calm/cooperative; and mood congruent with affect.  Patient is speaking in a clear tone at moderate volume, and normal pace; with good eye contact. His thought process is coherent and relevant; There is no indication that he is currently responding to internal/external stimuli or experiencing delusional thought content.  Patient denies suicidal/self-harm/homicidal ideation,  psychosis, and paranoia.  Patient has remained calm throughout assessment and has answered questions appropriately.    Visit Diagnosis:    ICD-10-CM   1. Schizophrenia, unspecified type (HCC)  F20.9       Past Psychiatric History:   Past Medical History:  Past Medical History:  Diagnosis Date   Fracture of metacarpal of right hand, closed 08/04/2010   Comminuted fx @ base  of [facility]   GERD (gastroesophageal reflux disease)    Hypertension    Schizophrenia (HCC) 2023   No past surgical history on file.  Family Psychiatric History:   Family History: No family history on file.  Social History:  Social History   Socioeconomic History   Marital status: Single    Spouse name: Not on file   Number of children: Not on file   Years of education: Not on file   Highest education level: Not on file  Occupational History   Not on file  Tobacco Use   Smoking status: Some Days    Types: E-cigarettes, Cigarettes   Smokeless tobacco: Never  Vaping Use   Vaping status: Every Day  Substance and Sexual Activity   Alcohol use: Yes    Alcohol/week: 4.0 standard drinks of alcohol    Types: 2 Cans of beer, 2 Shots of liquor per week    Comment: occ   Drug use: Not Currently   Sexual activity: Not Currently  Other Topics Concern   Not on file  Social History Narrative   Not on file   Social Determinants of Health   Financial Resource Strain: Not on file  Food Insecurity: Not on file  Transportation Needs: Unknown (03/11/2022)   PRAPARE -  Administrator, Civil Service (Medical): Patient declined    Lack of Transportation (Non-Medical): Not on file  Physical Activity: Not on file  Stress: Not on file  Social Connections: Unknown (11/05/2021)   [facility] from [facility]   Social Network    Social Network: Not on file    Allergies: No Known Allergies  Metabolic Disorder Labs: Lab Results  Component Value Date   HGBA1C 6.1 (H) 11/22/2022   MPG  119.76 03/13/2022   MPG 114.02 10/13/2021   Lab Results  Component Value Date   PROLACTIN 45.2 (H) 11/22/2022   PROLACTIN 1.8 (L) 10/13/2021   Lab Results  Component Value Date   CHOL 153 11/22/2022   TRIG 74 11/22/2022   HDL 47 11/22/2022   CHOLHDL 3.3 11/22/2022   VLDL 13 03/13/2022   LDLCALC 92 11/22/2022   LDLCALC 87 03/13/2022   Lab Results  Component Value Date   TSH 1.070 11/22/2022   TSH 1.880 09/22/2022    Therapeutic Level Labs: No results found for: "LITHIUM" No results found for: "VALPROATE" No results found for: "CBMZ"  Current Medications: Current Outpatient Medications  Medication Sig Dispense Refill   amphetamine-dextroamphetamine (ADDERALL XR) 15 MG 24 hr capsule Take 1 capsule by mouth daily. 30 capsule 0   buPROPion (WELLBUTRIN XL) 300 MG 24 hr tablet Take 1 tablet (300 mg total) by mouth every morning. 30 tablet 3   hydrOXYzine (ATARAX) 25 MG tablet Take 1 tablet (25 mg total) by mouth 3 (three) times daily as needed for anxiety. 90 tablet 3   paliperidone (INVEGA SUSTENNA) 156 MG/ML SUSY injection Inject 1 mL (156 mg total) into the muscle every 28 (twenty-eight) days. 1 mL 11   sildenafil (VIAGRA) 100 MG tablet Take 0.5-1 tablets (50-100 mg total) by mouth daily as needed for erectile dysfunction. 20 tablet 11   traZODone (DESYREL) 50 MG tablet Take 1 tablet (50 mg total) by mouth at bedtime as needed for sleep. 30 tablet 3   Current Facility-Administered Medications  Medication Dose Route Frequency Provider Last Rate Last Admin   paliperidone (INVEGA SUSTENNA) injection 156 mg  156 mg Intramuscular Q28 days Karsten Ro, MD   156 mg at 04/19/23 1501     Musculoskeletal: Strength & Muscle Tone: within normal limits Gait & Station: normal Patient leans: N/A  Psychiatric Specialty Exam: Review of Systems  There were no vitals taken for this visit.There is no height or weight on file to calculate BMI.  General Appearance: Casual  Eye Contact:   Good  Speech:  Clear and Coherent  Volume:  Normal  Mood:  Anxious and Depressed  Affect:  Congruent  Thought Process:  Coherent  Orientation:  Full (Time, Place, and Person)  Thought Content: Logical   Suicidal Thoughts:  No  Homicidal Thoughts:  No  Memory:  Immediate;   Good Recent;   Good  Judgement:  Good  Insight:  Good  Psychomotor Activity:  Normal  Concentration:  Concentration: Good  Recall:  Good  Fund of Knowledge: Good  Language: Good  Akathisia:  No  Handed:  Right  AIMS (if indicated): not done  Assets:  Communication Skills Desire for Improvement Resilience Social Support  ADL's:  Intact  Cognition: WNL  Sleep:  Good   Screenings: AIMS    Flowsheet Row Office Visit from 09/28/2022 in [facility]) [facility]) [facility]  AIMS Total Score 0 0 0      GAD-7    Flowsheet Row Clinical Support from 01/30/2023 in [facility] Office Visit from 01/24/2023 in [facility] Clinical Support from 11/22/2022 in [facility] Clinical Support from 11/03/2022 in [facility] Office Visit from 09/22/2022 in [facility]  Total GAD-7 Score 11 10 10 11 12       PHQ2-9    Flowsheet Row Clinical Support from 01/30/2023 in [facility] Office Visit from 01/24/2023 in [facility] Clinical Support from 11/22/2022 in [facility] Clinical Support from 11/03/2022 in [facility] Office Visit from 09/22/2022 in [facility]  PHQ-2 Total Score 3 3 4 4 5   PHQ-9 Total Score 15 12 14 17 16       Flowsheet Row ED from  03/12/2023 in [facility] Emergency Department at [facility] Clinical Support from 01/30/2023 in [facility] Clinical Support from 11/22/2022 in [facility]  C-SSRS RISK CATEGORY No Risk Error: Q3, 4, or 5 should not be populated when Q2 is No Error: Q3, 4, or 5 should not be populated when Q2 is No        Assessment and Plan: Aleksander Edmiston 36 year old African-American male to follow-up with monthly injection.  Family requested to see Dr. Doyne Keel nurse practitioner at follow-up visit.  Education provided with taking Wellbutrin daily as directed.  Denied illicit drug use or substance abuse currently.  Medications to be refilled by attending provider.  Support, encouragement and reassurance was provided.  Collaboration of Care: Collaboration of Care: Medication Management AEB follow-up every 28 days  Patient/Guardian was advised Release of Information must be obtained prior to any record release in order to collaborate their care with an outside provider. Patient/Guardian was advised if they have not already done so to contact the registration department to sign all necessary forms in order for [place] to release information regarding their care.   Consent: Patient/Guardian gives verbal consent for treatment and assignment of benefits for services provided during this visit. Patient/Guardian expressed understanding and agreed to proceed.    Oneta Rack, NP 04/20/2023, 9:08 AM

## 2023-04-19 NOTE — Progress Notes (Cosign Needed)
Patient arrived with his mother for his injection of [place] 156mg . Given in his Right Deltoid without issues or complaints. Mother irritated that he is seeing a different psychiatrist today for his check up and not Dr. Doyne Keel. No side effects, will return in 28 days.

## 2023-04-20 ENCOUNTER — Encounter (HOSPITAL_COMMUNITY): Payer: Self-pay | Admitting: Family

## 2023-04-27 ENCOUNTER — Other Ambulatory Visit (HOSPITAL_COMMUNITY): Payer: Self-pay | Admitting: Psychiatry

## 2023-04-27 ENCOUNTER — Telehealth (HOSPITAL_COMMUNITY): Payer: Self-pay | Admitting: *Deleted

## 2023-04-27 DIAGNOSIS — F900 Attention-deficit hyperactivity disorder, predominantly inattentive type: Secondary | ICD-10-CM

## 2023-04-27 MED ORDER — AMPHETAMINE-DEXTROAMPHET ER 15 MG PO CP24: 15.0000 mg | ORAL_CAPSULE | Freq: Every day | ORAL | 0 refills | Status: DC

## 2023-04-27 NOTE — Telephone Encounter (Signed)
Medication sent to preferred pharmacy

## 2023-04-27 NOTE — Telephone Encounter (Signed)
Call from patient requesting a new rx for his Adderall. Reviewed chart, seen by Alcario Drought NP a week ago, no changes made to his med per note, but no new rx written for his adderall. Last written on 03/23/23. Will forward this request to Dr Doyne Keel to renew. INformed patient to check with is pharmacy tomorrow to see its been called in for him .

## 2023-05-17 ENCOUNTER — Encounter (HOSPITAL_COMMUNITY): Payer: Self-pay

## 2023-05-17 ENCOUNTER — Ambulatory Visit (INDEPENDENT_AMBULATORY_CARE_PROVIDER_SITE_OTHER): Payer: MEDICAID | Admitting: *Deleted

## 2023-05-17 VITALS — BP 125/87 | HR 88 | Resp 16 | Ht 69.0 in | Wt 201.0 lb

## 2023-05-17 DIAGNOSIS — F209 Schizophrenia, unspecified: Secondary | ICD-10-CM | POA: Diagnosis not present

## 2023-05-17 NOTE — Progress Notes (Cosign Needed)
Patient arrived with his mother for his injection of [place] 156mg . Given in Left Deltoid without issue or complaint. States his medication is working well. No side effects. Denies SI/HI or AV hallucinations. Will return in 28 days. Pleasant, cooperative, states he is tried and a little sleepy today.

## 2023-06-05 ENCOUNTER — Telehealth (HOSPITAL_COMMUNITY): Payer: Self-pay

## 2023-06-05 NOTE — Telephone Encounter (Signed)
Medication refill - Patient called requesting a new Adderall XR 15 mg order be sent into his [facility] on [address] last ordered 04/27/23 and pt returns next on 06/15/23. Agreed to send request to provider as patient stated he is now out.

## 2023-06-06 ENCOUNTER — Other Ambulatory Visit (HOSPITAL_COMMUNITY): Payer: Self-pay | Admitting: Psychiatry

## 2023-06-06 DIAGNOSIS — F900 Attention-deficit hyperactivity disorder, predominantly inattentive type: Secondary | ICD-10-CM

## 2023-06-06 MED ORDER — AMPHETAMINE-DEXTROAMPHET ER 15 MG PO CP24: 15.0000 mg | ORAL_CAPSULE | Freq: Every day | ORAL | 0 refills | Status: DC

## 2023-06-06 NOTE — Telephone Encounter (Signed)
Medication sent to preferred pharmacy

## 2023-06-06 NOTE — Telephone Encounter (Signed)
Medication management - generic message left for patient that Dr. Doyne Keel had sent in his requested refill of medication to his preferred pharmacy.

## 2023-06-15 ENCOUNTER — Encounter (HOSPITAL_COMMUNITY): Payer: Medicaid Other | Admitting: Psychiatry

## 2023-06-15 ENCOUNTER — Encounter (HOSPITAL_COMMUNITY): Payer: Self-pay | Admitting: Psychiatry

## 2023-06-15 ENCOUNTER — Ambulatory Visit (INDEPENDENT_AMBULATORY_CARE_PROVIDER_SITE_OTHER): Payer: Medicaid Other | Admitting: Psychiatry

## 2023-06-15 ENCOUNTER — Ambulatory Visit (HOSPITAL_COMMUNITY): Payer: Medicaid Other

## 2023-06-15 ENCOUNTER — Other Ambulatory Visit: Payer: Self-pay

## 2023-06-15 VITALS — BP 134/86 | HR 112 | Ht 69.0 in | Wt 198.0 lb

## 2023-06-15 DIAGNOSIS — F209 Schizophrenia, unspecified: Secondary | ICD-10-CM | POA: Diagnosis not present

## 2023-06-15 DIAGNOSIS — F411 Generalized anxiety disorder: Secondary | ICD-10-CM

## 2023-06-15 DIAGNOSIS — G4700 Insomnia, unspecified: Secondary | ICD-10-CM

## 2023-06-15 DIAGNOSIS — F200 Paranoid schizophrenia: Secondary | ICD-10-CM

## 2023-06-15 MED ORDER — TRAZODONE HCL 50 MG PO TABS: 50.0000 mg | ORAL_TABLET | Freq: Every evening | ORAL | 3 refills | Status: DC | PRN

## 2023-06-15 MED ORDER — INVEGA SUSTENNA 156 MG/ML IM SUSY: 156.0000 mg | PREFILLED_SYRINGE | INTRAMUSCULAR | 11 refills | Status: DC

## 2023-06-15 MED ORDER — BUPROPION HCL ER (XL) 300 MG PO TB24: 300.0000 mg | ORAL_TABLET | ORAL | 3 refills | Status: DC

## 2023-06-15 MED ORDER — HYDROXYZINE HCL 25 MG PO TABS: 25.0000 mg | ORAL_TABLET | Freq: Three times a day (TID) | ORAL | 3 refills | Status: DC | PRN

## 2023-06-15 NOTE — Progress Notes (Signed)
BH MD/PA/NP OP Progress Note  06/15/2023 2:52 PM Devin Davis  MRN:  [id number]  Chief Complaint: "The lower does of Hinda Glatter is helping" Per mother "He still sleeps a lot"  HPI: 37 year old male seen today for follow-up psychiatric evaluation.  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  Currently he is managed on hydroxyzine 25 mg 3 times daily as needed, Wellbutrin 300 mg daily, trazodone 50 mg nightly as needed, Adderall XR 15 mg daily, and Invega Sustenna 117 mg monthly.  Patient informed writer that his medications are effective in managing his psychiatric conditions.  Today he is well-groomed, pleasant, cooperative, and engaged in conversation.  He informed Clinical research associate that since lowering Invega he has been more alert and awake.  He informed Clinical research associate that he is able to get more things done at work.  Patient mother notes that he still seems fatigued at times as he sleeps a lot.  Since his last visit he notes that he has been somewhat anxious.  He reports that he is worried about his children and his mother who has a blood clot.  He informed Clinical research associate that his children are not in the safest environment at this time.  Today provider conducted a GAD-7 and patient scored a 7, his last visit he scored an 11.  Provider also conducted PHQ-9 of he scored 10, at his last visit he scored a 15.  He endorsed adequate sleep and appetite.  Today he denies SI/HI/VAH, mania, paranoia.  Patient continues to deny substance use.    Overall patient notes that he is doing well.  No medication changes made today.  Patient agreeable to continue medication as prescribed.  Adderall not filled today as it was recently filled on 06/06/2023.  Patient encouraged to discussed continued fatigue with his PCP.  He endorsed understanding and agreed.  No other concerns at this time.     Visit Diagnosis:     ICD-10-CM   1. Schizophrenia, unspecified type (HCC)  F20.9 buPROPion (WELLBUTRIN XL) 300 MG 24 hr tablet    paliperidone (INVEGA SUSTENNA) 156 MG/ML SUSY injection    traZODone (DESYREL) 50 MG tablet    2. Generalized anxiety disorder  F41.1 hydrOXYzine (ATARAX) 25 MG tablet        Past Psychiatric History: Amphetamine psychostimulant induced psychosis with delusions, substance-induced psychotic disorder, psychosis, schizophrenia, and tobacco use  Past Medical History:  Past Medical History:  Diagnosis Date   Fracture of metacarpal of right hand, closed 08/04/2010   Comminuted fx @ base  of [facility]   GERD (gastroesophageal reflux disease)    Hypertension    Schizophrenia (HCC) 2023   History reviewed. No pertinent surgical history.  Family Psychiatric History: Maternal grandmother schizophrenia, maternal aunt maternal uncle schizophrenia  Family History: History reviewed. No pertinent family history.  Social History:  Social History   Socioeconomic History   Marital status: Single    Spouse name: Not on file   Number of children: Not on file   Years of education: Not on file   Highest education level: Not on file  Occupational History   Not on file  Tobacco Use   Smoking status: Some Days    Types: E-cigarettes, Cigarettes   Smokeless tobacco: Never  Vaping Use   Vaping status: Every Day  Substance and Sexual Activity   Alcohol use: Yes    Alcohol/week: 4.0 standard drinks of alcohol  Types: 2 Cans of beer, 2 Shots of liquor per week    Comment: occ   Drug use: Not Currently   Sexual activity: Not Currently  Other Topics Concern   Not on file  Social History Narrative   Not on file   Social Drivers of Health   Financial Resource Strain: Not on file  Food Insecurity: Not on file  Transportation Needs: Unknown (03/11/2022)   PRAPARE - Administrator, Civil Service (Medical): Patient declined    Lack of Transportation (Non-Medical): Not on file   Physical Activity: Not on file  Stress: Not on file  Social Connections: Unknown (11/05/2021)   Received from [facility]   Social Network    Social Network: Not on file    Allergies: No Known Allergies  Metabolic Disorder Labs: Lab Results  Component Value Date   HGBA1C 6.1 (H) 11/22/2022   MPG 119.76 03/13/2022   MPG 114.02 10/13/2021   Lab Results  Component Value Date   PROLACTIN 45.2 (H) 11/22/2022   PROLACTIN 1.8 (L) 10/13/2021   Lab Results  Component Value Date   CHOL 153 11/22/2022   TRIG 74 11/22/2022   HDL 47 11/22/2022   CHOLHDL 3.3 11/22/2022   VLDL 13 03/13/2022   LDLCALC 92 11/22/2022   LDLCALC 87 03/13/2022   Lab Results  Component Value Date   TSH 1.070 11/22/2022   TSH 1.880 09/22/2022    Therapeutic Level Labs: No results found for: "LITHIUM" No results found for: "VALPROATE" No results found for: "CBMZ"  Current Medications: Current Outpatient Medications  Medication Sig Dispense Refill   amphetamine-dextroamphetamine (ADDERALL XR) 15 MG 24 hr capsule Take 1 capsule by mouth daily. 30 capsule 0   buPROPion (WELLBUTRIN XL) 300 MG 24 hr tablet Take 1 tablet (300 mg total) by mouth every morning. 30 tablet 3   hydrOXYzine (ATARAX) 25 MG tablet Take 1 tablet (25 mg total) by mouth 3 (three) times daily as needed for anxiety. 90 tablet 3   paliperidone (INVEGA SUSTENNA) 156 MG/ML SUSY injection Inject 1 mL (156 mg total) into the muscle every 28 (twenty-eight) days. 1 mL 11   sildenafil (VIAGRA) 100 MG tablet Take 0.5-1 tablets (50-100 mg total) by mouth daily as needed for erectile dysfunction. 20 tablet 11   traZODone (DESYREL) 50 MG tablet Take 1 tablet (50 mg total) by mouth at bedtime as needed for sleep. 30 tablet 3   Current Facility-Administered Medications  Medication Dose Route Frequency Provider Last Rate Last Admin   paliperidone (INVEGA SUSTENNA) injection 156 mg  156 mg Intramuscular Q28 days Karsten Ro, MD   156  mg at 06/15/23 1433     Musculoskeletal: Strength & Muscle Tone: within normal limits Gait & Station: normal Patient leans: N/A  Psychiatric Specialty Exam: Review of Systems  Blood pressure 134/86, pulse (!) 112, height 5\' 9"  (1.753 m), weight 198 lb (89.8 kg), SpO2 100%.Body mass index is 29.24 kg/m.  General Appearance: Well Groomed  Eye Contact:  Good  Speech:  Clear and Coherent and Normal Rate  Volume:  Normal  Mood:  Anxious  Affect:  Appropriate and Congruent  Thought Process:  Coherent, Goal Directed, and Linear  Orientation:  Full (Time, Place, and Person)  Thought Content: WDL and Logical   Suicidal Thoughts:  No  Homicidal Thoughts:  No  Memory:  Immediate;   Good Recent;   Good Remote;   Good  Judgement:  Good  Insight:  Good  Psychomotor Activity:  Normal  Concentration:  Concentration: Fair and Attention Span: Fair  Recall:  Good  Fund of Knowledge: Good  Language: Good  Akathisia:  No  Handed:  Right  AIMS (if indicated): not done  Assets:  Communication Skills Desire for Improvement Financial Resources/Insurance Housing Physical Health Social Support Talents/Skills  ADL's:  Intact  Cognition: WNL  Sleep:  Fair   Screenings: AIMS    Flowsheet Row Office Visit from 09/28/2022 in [facility] Admission (Discharged) [facility] (Dis[facility]) [facility]  AIMS Total Score 0 0 0      GAD-7    Flowsheet Row Clinical Support from 06/15/2023 in [facility] Clinical Support from 01/30/2023 in [facility] Office Visit from 01/24/2023 in [facility] [place] [facility] [place] Clinical Support from 11/22/2022 in [facility] Clinical Support from 11/03/2022 in [facility]   Total GAD-7 Score 7 11 10 10 11       PHQ2-9    Flowsheet Row Clinical Support from 06/15/2023 in [facility] Clinical Support from 01/30/2023 in [facility] Office Visit from 01/24/2023 in [facility] [place] [facility] [place] Clinical Support from 11/22/2022 in [facility] Clinical Support from 11/03/2022 in [facility]  PHQ-2 Total Score 3 3 3 4 4   PHQ-9 Total Score 10 15 12 14 17       Flowsheet Row ED from 03/12/2023 in [facility] Department at [facility] Clinical Support from 01/30/2023 in [facility] Clinical Support from 11/22/2022 in [facility]  C-SSRS RISK CATEGORY No Risk Error: Q3, 4, or 5 should not be populated when Q2 is No Error: Q3, 4, or 5 should not be populated when Q2 is No        Assessment and Plan: Patient reports that overall he is doing well.  No medication changes made today.  Patient agreeable to continue medication as prescribed.  Adderall not filled today as it was recently filled on 06/06/2023.  Patient encouraged to discussed continued fatigue with his PCP.   1. Schizophrenia, unspecified type (HCC) (Primary)  Continue- buPROPion (WELLBUTRIN XL) 300 MG 24 hr tablet; Take 1 tablet (300 mg total) by mouth every morning.  Dispense: 30 tablet; Refill: 3 Continue- paliperidone (INVEGA SUSTENNA) 156 MG/ML SUSY injection; Inject 1 mL (156 mg total) into the muscle every 28 (twenty-eight) days.  Dispense: 1 mL; Refill: 11 - traZODone (DESYREL) 50 MG tablet; Take 1 tablet (50 mg total) by mouth at bedtime as needed for sleep.  Dispense: 30 tablet; Refill: 3  2. Generalized anxiety disorder  Continue- hydrOXYzine (ATARAX) 25 MG tablet; Take 1 tablet (25 mg total) by mouth 3 (three) times daily as needed for anxiety.  Dispense: 90 tablet; Refill:  3       Follow-up in 2 months Shanna Cisco, NP 06/15/2023, 2:52 PM

## 2023-06-15 NOTE — Progress Notes (Cosign Needed)
PATINET PRESENTS TO THE OFFICE FOR INVEGA SUSTENNA 156 INJECTION GIVEN IN THE RIGHT DELTOID BY DONNA BRIDGES MA , PATIENT DID WELL OVERALL WILL RETURN IN 28 DAYS

## 2023-07-04 ENCOUNTER — Other Ambulatory Visit (HOSPITAL_COMMUNITY): Payer: Self-pay | Admitting: Psychiatry

## 2023-07-04 ENCOUNTER — Telehealth (HOSPITAL_COMMUNITY): Payer: Self-pay | Admitting: Psychiatry

## 2023-07-04 DIAGNOSIS — F900 Attention-deficit hyperactivity disorder, predominantly inattentive type: Secondary | ICD-10-CM

## 2023-07-04 MED ORDER — AMPHETAMINE-DEXTROAMPHET ER 15 MG PO CP24: 15.0000 mg | ORAL_CAPSULE | Freq: Every day | ORAL | 0 refills | Status: DC

## 2023-07-04 NOTE — Telephone Encounter (Deleted)
Medication sent to preferred pharmacy

## 2023-07-12 ENCOUNTER — Ambulatory Visit (HOSPITAL_COMMUNITY): Payer: Medicaid Other

## 2023-07-12 ENCOUNTER — Telehealth (HOSPITAL_COMMUNITY): Payer: Self-pay | Admitting: Psychiatry

## 2023-07-12 ENCOUNTER — Telehealth (HOSPITAL_COMMUNITY): Payer: Self-pay
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
## 2023-07-12 NOTE — Telephone Encounter (Signed)
 PT's mother Devin Davis (No DPR) called to inform us  that he would not make it for his injection appt today due to being incarcerated. The appointment was No Showed. I know know I should have just cancelled it. PT's mother asked for a message to provider be made asking for a call back. Mother was contacted by provider and another encounter was made. PT's mother informed us  that his injection can be picked up at our office and given to jail where he can receive his INJ ASAP.

## 2023-07-17 ENCOUNTER — Telehealth (HOSPITAL_COMMUNITY): Payer: Self-pay | Admitting: Psychiatry

## 2023-07-17 NOTE — Telephone Encounter (Signed)
 Pt mom called. Would like provider to give her a call @ [phone]. Thanks!

## 2023-07-18 NOTE — Telephone Encounter (Signed)
 Provider informed patient's mother that a release of information form would be needed from the patient.  Patient's mother asked if it can be faxed over to the [facility] jail.  Provider was agreeable to this.  Provider wrote a statement on the facesheet of the fax informing patient to decline or approve release of information to [facility] jail, decline or improved information to be shared with his mother Devin Davis), and decline or approve patient's injection being picked up by his mother.  Provider informed patient's mother that our response was not yet obtained.  She endorsed understanding.  No other concerns noted at this time.

## 2023-07-19 NOTE — Telephone Encounter (Signed)
 DPR and ROI have been completed.Patients mother informed that she can pick up his medication and deliver it to [facility].

## 2023-07-27 ENCOUNTER — Ambulatory Visit: Payer: MEDICAID | Admitting: Family Medicine

## 2023-08-24 ENCOUNTER — Ambulatory Visit (INDEPENDENT_AMBULATORY_CARE_PROVIDER_SITE_OTHER): Payer: MEDICAID

## 2023-08-24 ENCOUNTER — Encounter (HOSPITAL_COMMUNITY): Payer: Self-pay

## 2023-08-24 ENCOUNTER — Telehealth (HOSPITAL_COMMUNITY): Payer: MEDICAID | Admitting: Psychiatry

## 2023-08-24 ENCOUNTER — Other Ambulatory Visit: Payer: Self-pay

## 2023-08-24 VITALS — BP 139/79 | HR 93 | Wt 179.2 lb

## 2023-08-24 DIAGNOSIS — F411 Generalized anxiety disorder: Secondary | ICD-10-CM | POA: Diagnosis not present

## 2023-08-24 DIAGNOSIS — F209 Schizophrenia, unspecified: Secondary | ICD-10-CM

## 2023-08-24 DIAGNOSIS — F200 Paranoid schizophrenia: Secondary | ICD-10-CM

## 2023-08-24 DIAGNOSIS — G4700 Insomnia, unspecified: Secondary | ICD-10-CM

## 2023-08-24 DIAGNOSIS — F900 Attention-deficit hyperactivity disorder, predominantly inattentive type: Secondary | ICD-10-CM

## 2023-08-24 MED ORDER — BUPROPION HCL ER (XL) 300 MG PO TB24
300.0000 mg | ORAL_TABLET | ORAL | 3 refills | Status: AC
  Filled 2023-08-24: qty 30, 30d supply, fill #0

## 2023-08-24 MED ORDER — AMPHETAMINE-DEXTROAMPHET ER 15 MG PO CP24: 15.0000 mg | ORAL_CAPSULE | Freq: Every day | ORAL | 0 refills | Status: AC

## 2023-08-24 MED ORDER — TRAZODONE HCL 50 MG PO TABS
50.0000 mg | ORAL_TABLET | Freq: Every evening | ORAL | 3 refills | Status: AC | PRN
  Filled 2023-08-24: qty 30, 30d supply, fill #0

## 2023-08-24 MED ORDER — HYDROXYZINE HCL 25 MG PO TABS
25.0000 mg | ORAL_TABLET | Freq: Three times a day (TID) | ORAL | 3 refills | Status: AC | PRN
  Filled 2023-08-24: qty 90, 30d supply, fill #0

## 2023-08-24 MED ORDER — INVEGA SUSTENNA 156 MG/ML IM SUSY: 156.0000 mg | PREFILLED_SYRINGE | INTRAMUSCULAR | 11 refills | Status: AC

## 2023-08-24 NOTE — Progress Notes (Cosign Needed)
PATINET PRESENTS TO THE OFFICE FOR INVEGA SUSTENNA 156 INJECTION GIVEN IN THE LEFT DELTOID BY Issacc Merlo MA , PATIENT DID WELL OVERALL WILL RETURN IN 28 DAYS  

## 2023-08-24 NOTE — Progress Notes (Signed)
BH MD/PA/NP OP Progress Note  08/24/2023 12:32 PM Devin Davis  MRN:  [id number]  Chief Complaint: "Im doing okay"   HPI: 38 year old male seen today for follow-up psychiatric evaluation.  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  Currently he is managed on hydroxyzine 25 mg 3 times daily as needed, Wellbutrin 300 mg daily, trazodone 50 mg nightly as needed, Adderall XR 15 mg daily, and Invega Sustenna 156 mg monthly.  Patient informed writer that his medications are effective in managing his psychiatric conditions.  Today he is well-groomed, pleasant, cooperative, and engaged in conversation.  He informed Clinical research associate that he is doing okay. He notes that his mood is stable and notes that his anxiety and depression are well managed. Today provider conducted a GAD 7 and patient scored an 8, at his last visit he scored a 7. Provider also conducted a PHQ 9 and patient scored a 9, at his last visit he scored a 10. He endorses increased sleep and adequate appetite. Today he denies SI/HI/VAH, mania, or paranoia.   Provider informed patient that his Hinda Glatter does was 156 mg monthly and not 117 mg monthly. Provider told patient that on 01/30/2023 it was discussed to lower Invega to 117 at the end of the year as he was complaining of increased sedation and fatigue. Patient does however was not lowered. Patient notes that likes his current does of Invega 156 mg monthly and would like to continue it. Provider was agreeable.  Patient notes that he took some time off from work to deal with court hearings. He does note that he plans to go back to work.  Patient continues to deny substance use.    Overall patient notes that he is doing well. At this time he wishes to continue his dose of Invega 156 mg and not lower it to 117 mg monthly. He will continue all other medications as prescribed. No  other concerns noted at this time.      Visit Diagnosis:    ICD-10-CM   1. Schizophrenia, unspecified type (HCC)  F20.9 buPROPion (WELLBUTRIN XL) 300 MG 24 hr tablet    traZODone (DESYREL) 50 MG tablet    paliperidone (INVEGA SUSTENNA) 156 MG/ML SUSY injection    2. Generalized anxiety disorder  F41.1 hydrOXYzine (ATARAX) 25 MG tablet    3. Attention deficit hyperactivity disorder (ADHD), predominantly inattentive type  F90.0 amphetamine-dextroamphetamine (ADDERALL XR) 15 MG 24 hr capsule         Past Psychiatric History: Amphetamine psychostimulant induced psychosis with delusions, substance-induced psychotic disorder, psychosis, schizophrenia, and tobacco use  Past Medical History:  Past Medical History:  Diagnosis Date   Fracture of metacarpal of right hand, closed 08/04/2010   Comminuted fx @ base  of R 4[facility]   GERD (gastroesophageal reflux disease)    Hypertension    Schizophrenia (HCC) 2023   History reviewed. No pertinent surgical history.  Family Psychiatric History: Maternal grandmother schizophrenia, maternal aunt maternal uncle schizophrenia  Family History: History reviewed. No pertinent family history.  Social History:  Social History   Socioeconomic History   Marital status: Single    Spouse name: Not on file   Number of children: Not on file   Years of education: Not on file   Highest education level: Not on file  Occupational History   Not on file  Tobacco Use   Smoking status: Some Days  Types: E-cigarettes, Cigarettes   Smokeless tobacco: Never  Vaping Use   Vaping status: Every Day  Substance and Sexual Activity   Alcohol use: Yes    Alcohol/week: 4.0 standard drinks of alcohol    Types: 2 Cans of beer, 2 Shots of liquor per week    Comment: occ   Drug use: Not Currently   Sexual activity: Not Currently  Other Topics Concern   Not on file  Social History Narrative   Not on file   Social Drivers of Health   Financial Resource  Strain: Not on file  Food Insecurity: Not on file  Transportation Needs: Unknown (03/11/2022)   PRAPARE - Administrator, Civil Service (Medical): Patient declined    Lack of Transportation (Non-Medical): Not on file  Physical Activity: Not on file  Stress: Not on file  Social Connections: Unknown (11/05/2021)   [facility]   Social Network    Social Network: Not on file    Allergies: No Known Allergies  Metabolic Disorder Labs: Lab Results  Component Value Date   HGBA1C 6.1 (H) 11/22/2022   MPG 119.76 03/13/2022   MPG 114.02 10/13/2021   Lab Results  Component Value Date   PROLACTIN 45.2 (H) 11/22/2022   PROLACTIN 1.8 (L) 10/13/2021   Lab Results  Component Value Date   CHOL 153 11/22/2022   TRIG 74 11/22/2022   HDL 47 11/22/2022   CHOLHDL 3.3 11/22/2022   VLDL 13 03/13/2022   LDLCALC 92 11/22/2022   LDLCALC 87 03/13/2022   Lab Results  Component Value Date   TSH 1.070 11/22/2022   TSH 1.880 09/22/2022    Therapeutic Level Labs: No results found for: "LITHIUM" No results found for: "VALPROATE" No results found for: "CBMZ"  Current Medications: Current Outpatient Medications  Medication Sig Dispense Refill   amphetamine-dextroamphetamine (ADDERALL XR) 15 MG 24 hr capsule Take 1 capsule by mouth daily. 30 capsule 0   buPROPion (WELLBUTRIN XL) 300 MG 24 hr tablet Take 1 tablet (300 mg total) by mouth every morning. 30 tablet 3   hydrOXYzine (ATARAX) 25 MG tablet Take 1 tablet (25 mg total) by mouth 3 (three) times daily as needed for anxiety. 90 tablet 3   paliperidone (INVEGA SUSTENNA) 156 MG/ML SUSY injection Inject 1 mL (156 mg total) into the muscle every 28 (twenty-eight) days. 1 mL 11   sildenafil (VIAGRA) 100 MG tablet Take 0.5-1 tablets (50-100 mg total) by mouth daily as needed for erectile dysfunction. 20 tablet 11   traZODone (DESYREL) 50 MG tablet Take 1 tablet (50 mg total) by mouth at bedtime as needed for  sleep. 30 tablet 3   Current Facility-Administered Medications  Medication Dose Route Frequency Provider Last Rate Last Admin   paliperidone (INVEGA SUSTENNA) injection 156 mg  156 mg Intramuscular Q28 days Karsten Ro, MD   156 mg at 06/15/23 1433     Musculoskeletal: Strength & Muscle Tone: within normal limits and Telehealth visit Gait & Station: normal, Telehealth visit Patient leans: N/A  Psychiatric Specialty Exam: Review of Systems  There were no vitals taken for this visit.There is no height or weight on file to calculate BMI.  General Appearance: Well Groomed  Eye Contact:  Good  Speech:  Clear and Coherent and Normal Rate  Volume:  Normal  Mood:  Euthymic  Affect:  Appropriate and Congruent  Thought Process:  Coherent, Goal Directed, and Linear  Orientation:  Full (Time, Place, and Person)  Thought  Content: WDL and Logical   Suicidal Thoughts:  No  Homicidal Thoughts:  No  Memory:  Immediate;   Good Recent;   Good Remote;   Good  Judgement:  Good  Insight:  Good  Psychomotor Activity:  Normal  Concentration:  Concentration: Good and Attention Span: Good  Recall:  Good  Fund of Knowledge: Good  Language: Good  Akathisia:  No  Handed:  Right  AIMS (if indicated): not done  Assets:  Communication Skills Desire for Improvement Financial Resources/Insurance Housing Physical Health Social Support Talents/Skills  ADL's:  Intact  Cognition: WNL  Sleep:  Good   Screenings: AIMS    Flowsheet Row Office Visit from 09/28/2022 in [facility] (Dis[facility]) [facility]) [facility]  AIMS Total Score 0 0 0      GAD-7    Flowsheet Row Video Visit from 08/24/2023 in [facility] Clinical Support from 06/15/2023 in [facility] Clinical Support from  01/30/2023 in [facility] Office Visit from 01/24/2023 in [facility] [place] [facility] [place] Clinical Support from 11/22/2022 in [facility]  Total GAD-7 Score 8 7 11 10 10       PHQ2-9    Flowsheet Row Video Visit from 08/24/2023 in [facility] Clinical Support from 06/15/2023 in [facility] Clinical Support from 01/30/2023 in [facility] Office Visit from 01/24/2023 in [facility] [place] [facility] [place] Clinical Support from 11/22/2022 in [facility]  PHQ-2 Total Score 3 3 3 3 4   PHQ-9 Total Score 9 10 15 12 14       Flowsheet Row ED from 03/12/2023 in [facility] Emergency Department at [facility] Clinical Support from 01/30/2023 in [facility] Clinical Support from 11/22/2022 in [facility]  C-SSRS RISK CATEGORY No Risk Error: Q3, 4, or 5 should not be populated when Q2 is No Error: Q3, 4, or 5 should not be populated when Q2 is No        Assessment and Plan: Patient reports that overall he is doing well.  No medication changes made today.  Patient agreeable to continue medication as prescribed.   Provider informed patient that his Hinda Glatter does was 156 mg monthly and not 117 mg monthly. Provider told patient that on 01/30/2023 it was discussed to lower Invega to 117 at the end of the year as he was complaining of increased sedation and fatigue. Patient does however was not lowered. Patient notes that likes his current does of Invega 156 mg monthly and would like to continue it. Provider was agreeable.  1. Schizophrenia, unspecified type (HCC)  Continue- buPROPion (WELLBUTRIN XL) 300 MG 24 hr tablet; Take 1 tablet (300 mg total) by mouth every morning.  Dispense: 30 tablet; Refill:  3 Continue- traZODone (DESYREL) 50 MG tablet; Take 1 tablet (50 mg total) by mouth at bedtime as needed for sleep.  Dispense: 30 tablet; Refill: 3 Continue- paliperidone (INVEGA SUSTENNA) 156 MG/ML SUSY injection; Inject 1 mL (156 mg total) into the muscle every 28 (twenty-eight) days.  Dispense: 1 mL; Refill: 11  2. Generalized anxiety disorder  Continue- hydrOXYzine (ATARAX) 25 MG tablet; Take 1 tablet (25 mg  total) by mouth 3 (three) times daily as needed for anxiety.  Dispense: 90 tablet; Refill: 3  3. Attention deficit hyperactivity disorder (ADHD), predominantly inattentive type  Continue- amphetamine-dextroamphetamine (ADDERALL XR) 15 MG 24 hr capsule; Take 1 capsule by mouth daily.  Dispense: 30 capsule; Refill: 0     Follow-up in 2 months Shanna Cisco, NP 08/24/2023, 12:32 PM

## 2023-09-05 ENCOUNTER — Other Ambulatory Visit: Payer: Self-pay

## 2023-09-18 ENCOUNTER — Telehealth (HOSPITAL_COMMUNITY): Payer: Self-pay | Admitting: Psychiatry

## 2023-09-19 ENCOUNTER — Telehealth (HOSPITAL_COMMUNITY): Payer: Self-pay

## 2023-09-19 NOTE — Telephone Encounter (Signed)
 Patients Invega injection is due on 09/21/2023. Patients mother notes that he has gone back to jail and request to pick up his injection. Provider was agreeable to give patients medication to his mother. Provider requested that documentation of administration date and time. She endorsed understanding and agreed. No other concerns noted at this time.

## 2023-09-19 NOTE — Telephone Encounter (Signed)
 Mother came to pick up sons medication of invega sustenna. Son is not able to come to clinic ATM.     Per Dr. Doyne Keel

## 2023-09-21 ENCOUNTER — Telehealth (HOSPITAL_COMMUNITY): Payer: Self-pay | Admitting: *Deleted

## 2023-09-21 ENCOUNTER — Ambulatory Visit (HOSPITAL_COMMUNITY): Payer: MEDICAID

## 2023-09-21 NOTE — Telephone Encounter (Signed)
 Nurse Karel Jarvis called from the detention center to state that Devin Davis received his injection of [place] 156mg  today on the Left deltoid.

## 2023-10-12 ENCOUNTER — Other Ambulatory Visit (HOSPITAL_COMMUNITY): Payer: Self-pay | Admitting: Psychiatry

## 2023-10-12 DIAGNOSIS — F209 Schizophrenia, unspecified: Secondary | ICD-10-CM

## 2023-10-19 ENCOUNTER — Telehealth (HOSPITAL_COMMUNITY): Payer: Self-pay | Admitting: *Deleted

## 2023-10-19 NOTE — Telephone Encounter (Signed)
 Call from [facility] to inform this office they gave him his Invega inj this am with this inj provided by this office and picked up to take to the jail by his mom. Will renew rx and continue to provide his shot and be aware of his schedule. Will provide his shot when he is released from jail.

## 2023-11-07 ENCOUNTER — Encounter (HOSPITAL_COMMUNITY): Payer: MEDICAID | Admitting: Psychiatry

## 2023-11-13 ENCOUNTER — Telehealth (HOSPITAL_COMMUNITY): Payer: Self-pay

## 2023-11-13 NOTE — Telephone Encounter (Signed)
 Medication management - Patient's Mother came to pick up patient's due Invega  Sustenna 156 mg medication, that was delivered to our office by his pharmacy, to take it to the jail so patient would stay on schedule.

## 2023-11-17 ENCOUNTER — Telehealth (HOSPITAL_COMMUNITY): Payer: Self-pay

## 2023-11-17 NOTE — Telephone Encounter (Signed)
 Nurse called from the detention center and stated " She gave his Invega  sustenna injection on 11/16/23" .        paliperidone  (INVEGA  SUSTENNA) 156 MG/ML SUSY injection 156 mg, Every 28 days         Summary: Inject 1 mL (156 mg total) into the muscle every 28 (twenty-eight) days., Starting Thu 08/24/2023, Normal Dose, Route, Frequency: 156 mg, Intramuscular, Every 28 daysStart: 02/20/2025Ord/Sold: 08/24/2023 (O)Ordered On: 02/20/2025Pharmacy: [id number] - [place], [place] - [address]ReportDx Associated: Taking: Long-term: Med Note:        Change Patient Sig: Inject 1 mL (156 mg total) into the muscle every 28 (twenty-eight) days. Ordering Department: [facility] Authorized By: Arlyne Bering, NP Dispense: 1 mL Refills: 11 ordered Note to Pharmacy: Please deliver to [address].

## 2023-11-20 NOTE — Telephone Encounter (Signed)
 Thank you for this update. Patient [facility] to be at the detention center but his injection was last given on 11/16/2023.

## 2023-12-11 ENCOUNTER — Telehealth (HOSPITAL_COMMUNITY): Payer: Self-pay | Admitting: Psychiatry

## 2023-12-11 NOTE — Telephone Encounter (Signed)
 Patient continue to be incarcerated. He has approved his mother to pick up his injection to be administered by the jail nursing staff. Patient mother notes that she will pick it up later this week.

## 2023-12-13 ENCOUNTER — Telehealth (HOSPITAL_COMMUNITY): Payer: Self-pay

## 2023-12-13 NOTE — Telephone Encounter (Signed)
 Thank you for this update

## 2023-12-13 NOTE — Telephone Encounter (Signed)
 Mother came to pick up sons INVEGA  sustenna: 156mg .    JNL, CMA

## 2023-12-14 ENCOUNTER — Telehealth (HOSPITAL_COMMUNITY): Payer: Self-pay

## 2023-12-14 NOTE — Telephone Encounter (Signed)
 Nursing staff from Patients correctional facility confirmed that he received his Invega  156 injection MG injection on 12/14/2023  JNL,CMA

## 2023-12-14 NOTE — Telephone Encounter (Signed)
 Thank you for this update

## 2024-01-08 ENCOUNTER — Telehealth (HOSPITAL_COMMUNITY): Payer: Self-pay

## 2024-02-09 ENCOUNTER — Telehealth (HOSPITAL_COMMUNITY): Payer: Self-pay

## 2024-02-09 NOTE — Telephone Encounter (Signed)
 Verneita RN, [facility] called and left voicemail stating that patient received his Invega  156mg  injection on 02/08/2024.

## 2024-03-07 ENCOUNTER — Telehealth (HOSPITAL_COMMUNITY): Payer: Self-pay

## 2024-03-07 NOTE — Telephone Encounter (Signed)
 Verneita RN, [facility] called stating that patient received his Invega  156mg  injection on 03/07/2024.

## 2024-07-24 ENCOUNTER — Telehealth (HOSPITAL_COMMUNITY): Payer: Self-pay | Admitting: Psychiatry

## 2024-07-24 NOTE — Telephone Encounter (Signed)
 Patient's mother called to let us  know that she will be coming in either tomorrow or Friday to pick up her son's medication.
